# Patient Record
Sex: Male | Born: 2000 | Race: Black or African American | Hispanic: No | Marital: Single | Smoking: Current every day smoker
Health system: Southern US, Community
[De-identification: ages and names within clinical notes are randomized; demographics above are authoritative.]

## PROBLEM LIST (undated history)

## (undated) DIAGNOSIS — N200 Calculus of kidney: Secondary | ICD-10-CM

## (undated) DIAGNOSIS — W3400XA Accidental discharge from unspecified firearms or gun, initial encounter: Secondary | ICD-10-CM

## (undated) DIAGNOSIS — Z905 Acquired absence of kidney: Secondary | ICD-10-CM

## (undated) DIAGNOSIS — Z993 Dependence on wheelchair: Secondary | ICD-10-CM

## (undated) DIAGNOSIS — G8220 Paraplegia, unspecified: Secondary | ICD-10-CM

## (undated) DIAGNOSIS — Z933 Colostomy status: Secondary | ICD-10-CM

## (undated) DIAGNOSIS — N319 Neuromuscular dysfunction of bladder, unspecified: Secondary | ICD-10-CM

## (undated) DIAGNOSIS — Y249XXA Unspecified firearm discharge, undetermined intent, initial encounter: Secondary | ICD-10-CM

## (undated) DIAGNOSIS — M278 Other specified diseases of jaws: Secondary | ICD-10-CM

## (undated) DIAGNOSIS — N529 Male erectile dysfunction, unspecified: Secondary | ICD-10-CM

## (undated) DIAGNOSIS — I82409 Acute embolism and thrombosis of unspecified deep veins of unspecified lower extremity: Secondary | ICD-10-CM

## (undated) HISTORY — PX: COLOSTOMY: SHX63

---

## 2020-05-09 ENCOUNTER — Encounter: Payer: Self-pay | Admitting: Physical Medicine and Rehabilitation

## 2020-05-16 ENCOUNTER — Encounter: Payer: Self-pay | Admitting: Physical Medicine and Rehabilitation

## 2020-05-16 ENCOUNTER — Other Ambulatory Visit: Payer: Self-pay

## 2020-05-16 ENCOUNTER — Encounter
Payer: BC Managed Care – PPO | Attending: Physical Medicine and Rehabilitation | Admitting: Physical Medicine and Rehabilitation

## 2020-05-16 VITALS — BP 117/73 | HR 74 | Temp 97.8°F | Wt 121.5 lb

## 2020-05-16 DIAGNOSIS — N319 Neuromuscular dysfunction of bladder, unspecified: Secondary | ICD-10-CM | POA: Diagnosis present

## 2020-05-16 DIAGNOSIS — G894 Chronic pain syndrome: Secondary | ICD-10-CM | POA: Diagnosis present

## 2020-05-16 DIAGNOSIS — G8220 Paraplegia, unspecified: Secondary | ICD-10-CM | POA: Diagnosis not present

## 2020-05-16 DIAGNOSIS — R252 Cramp and spasm: Secondary | ICD-10-CM | POA: Insufficient documentation

## 2020-05-16 DIAGNOSIS — Z993 Dependence on wheelchair: Secondary | ICD-10-CM | POA: Insufficient documentation

## 2020-05-16 DIAGNOSIS — Z933 Colostomy status: Secondary | ICD-10-CM | POA: Insufficient documentation

## 2020-05-16 DIAGNOSIS — Z79891 Long term (current) use of opiate analgesic: Secondary | ICD-10-CM | POA: Diagnosis present

## 2020-05-16 DIAGNOSIS — Z5181 Encounter for therapeutic drug level monitoring: Secondary | ICD-10-CM | POA: Insufficient documentation

## 2020-05-16 MED ORDER — DULOXETINE HCL 60 MG PO CPEP: 60.0000 mg | ORAL_CAPSULE | Freq: Every day | ORAL | 5 refills | Status: DC

## 2020-05-16 NOTE — Addendum Note (Signed)
Addended by: Doreene Eland on: 05/16/2020 10:46 AM   Modules accepted: Orders
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
## 2020-05-16 NOTE — Patient Instructions (Signed)
1. Increase Duloxetine to 60 mg daily-  Can cause constipation- so might need to add an oral bowel medicine- Senokot? If needed  2. Takes Percocet when has exacerbation of back pain.  Spikes every other day. Out of pain meds.  Norco didn't help when has exacerbations.  3. Discussed reversal of colostomy vs not.  Discussed bowel program. Put in general surgery referral for reversal of colostomy if wanted.   Bowel program for SCI patients (BP)  1. Use Dulcolax suppositories 2. Start with doing bowel program within 1 hour after a meal- breakfast or dinner- most choose dinner 3. Take bowels meds -Po/oral at opposite time doing bowel program- if BP (bowel program) in evening, give PO meds in AM; and vice versa.  4. Start with digital stimulation- up to 2nd knuckle- stimulate rectum x 30 seconds 5. Place suppository- wait 10 minutes 6. Do Dig stim/Digital stimulation) every 10-15 minutes until empty 7. If gets severely constipated, >3 days, can use Magnesium Citrate and follow in 4 hours with suppository or any type of enema.  8. Takes 6 weeks or so to get bowel to have a habit.      3. Happy to write for Percocet once gets drug screen- and signs opiate contract. Call me to write prescription.   4.  F/U in 6 weeks.  Double appointment for SCI patients

## 2020-05-20 ENCOUNTER — Encounter: Payer: Self-pay | Admitting: Physical Therapy

## 2020-05-20 ENCOUNTER — Other Ambulatory Visit: Payer: Self-pay

## 2020-05-20 ENCOUNTER — Ambulatory Visit: Payer: BC Managed Care – PPO | Attending: Physical Medicine and Rehabilitation | Admitting: Physical Therapy

## 2020-05-20 DIAGNOSIS — R208 Other disturbances of skin sensation: Secondary | ICD-10-CM | POA: Insufficient documentation

## 2020-05-20 DIAGNOSIS — G8220 Paraplegia, unspecified: Secondary | ICD-10-CM | POA: Diagnosis present

## 2020-05-20 DIAGNOSIS — R2681 Unsteadiness on feet: Secondary | ICD-10-CM | POA: Diagnosis present

## 2020-05-20 DIAGNOSIS — G8221 Paraplegia, complete: Secondary | ICD-10-CM | POA: Insufficient documentation

## 2020-05-20 DIAGNOSIS — R29818 Other symptoms and signs involving the nervous system: Secondary | ICD-10-CM | POA: Diagnosis present

## 2020-05-21 ENCOUNTER — Telehealth: Payer: Self-pay | Admitting: Physical Therapy

## 2020-05-21 DIAGNOSIS — G8220 Paraplegia, unspecified: Secondary | ICD-10-CM

## 2020-05-21 LAB — DRUG TOX MONITOR 1 W/CONF, ORAL FLD
Amphetamines: NEGATIVE ng/mL (ref <10)
Barbiturates: NEGATIVE ng/mL (ref <10)
Benzodiazepines: NEGATIVE ng/mL (ref <0.50)
Buprenorphine: NEGATIVE ng/mL (ref <0.10)
Cocaine: NEGATIVE ng/mL (ref <5.0)
Cotinine: 227.8 ng/mL — ABNORMAL HIGH (ref <5.0)
Fentanyl: NEGATIVE ng/mL (ref <0.10)
Heroin Metabolite: NEGATIVE ng/mL (ref <1.0)
MARIJUANA: POSITIVE ng/mL — AB (ref <2.5)
MDMA: NEGATIVE ng/mL (ref <10)
Meprobamate: NEGATIVE ng/mL (ref <2.5)
Methadone: NEGATIVE ng/mL (ref <5.0)
Nicotine Metabolite: POSITIVE ng/mL — AB (ref <5.0)
Opiates: NEGATIVE ng/mL (ref <2.5)
Phencyclidine: NEGATIVE ng/mL (ref <10)
THC: 12.9 ng/mL — ABNORMAL HIGH (ref <2.5)
Tapentadol: NEGATIVE ng/mL (ref <5.0)
Tramadol: NEGATIVE ng/mL (ref <5.0)
Zolpidem: NEGATIVE ng/mL (ref <5.0)

## 2020-05-21 LAB — DRUG TOX ALC METAB W/CON, ORAL FLD: Alcohol Metabolite: NEGATIVE ng/mL (ref <25)
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
## 2020-05-21 NOTE — Telephone Encounter (Signed)
Hello Dr. Berline Chough, Meade Maw was evaluated by physical therapy yesterday to begin standing and gait training with HKAFO's.    His mother mentioned that he recently had a DVT in his LLE and that he had to stop PT in [place] because the edema in his LLE prevented his HKAFO's from fitting.  The edema has resolved but he is no longer taking blood thinner medication so I wanted to check with you first to see if we need to have him re-assessed for DVT before we begin standing and ambulating?    His mother also mentioned that the wound on his sacrum continues to re-open, especially when he transfers and sits on his shower seat at home for a prolonged period of time.  I feel like he may benefit from a padded tub bench - one for skin protection and two to improve safety with transfers in and out of the tub.  If you agree, could you write a DME order in Epic for a padded tub transfer bench?  Thank you for your assistance! Dierdre Highman, PT, DPT 05/21/20    9:45 PM

## 2020-05-23 ENCOUNTER — Other Ambulatory Visit: Payer: Self-pay

## 2020-05-23 ENCOUNTER — Ambulatory Visit: Payer: BC Managed Care – PPO | Admitting: Physical Therapy

## 2020-05-23 ENCOUNTER — Encounter: Payer: Self-pay | Admitting: Physical Therapy

## 2020-05-23 DIAGNOSIS — R2681 Unsteadiness on feet: Secondary | ICD-10-CM

## 2020-05-23 DIAGNOSIS — R208 Other disturbances of skin sensation: Secondary | ICD-10-CM

## 2020-05-23 DIAGNOSIS — G8221 Paraplegia, complete: Secondary | ICD-10-CM

## 2020-05-23 DIAGNOSIS — R29818 Other symptoms and signs involving the nervous system: Secondary | ICD-10-CM

## 2020-05-23 NOTE — Patient Instructions (Signed)
Access Code: [email or web address] URL: [email or web address] Date: 05/23/2020 Prepared by: Bufford Lope  Exercises Long Sitting Tricep Dip - 1 x daily - 7 x weekly - 2 sets - 10 reps Seated Shoulder Row with Anchored Resistance - 1 x daily - 7 x weekly - 2 sets - 12 reps Alternating Shoulder Press on Swiss Ball - 1 x daily - 7 x weekly - 2 sets - 10 reps Seated Shoulder External Rotation with Resistance - 1 x daily - 7 x weekly - 2 sets - 10 reps Seated Shoulder Internal Rotation with Anchored Resistance - 1 x daily - 7 x weekly - 2 sets - 10 reps Seated Single Arm Bicep Curls Supinated with Dumbbell - 1 x daily - 7 x weekly - 2 sets - 12 reps

## 2020-05-23 NOTE — Therapy (Signed)
[facility] [address] [place], [place], [address] Phone: [phone]   Fax:  [phone]  Physical Therapy Treatment  Patient Details  Name: Luke Velasquez MRN: [id number] Date of Birth: 04-04-2001 Referring Provider (PT): Curlene Dolphin, MD (Physician in [place]; care has been transferred to Genice Rouge, MD   Encounter Date: 05/23/2020   PT End of Session - 05/23/20 1259    Visit Number 2    Number of Visits 21    Date for PT Re-Evaluation 07/30/20    Authorization Type BCBS    Authorization Time Period VL: 60 PT/OT/SLP combined    Authorization - Visit Number 2    Authorization - Number of Visits 60    PT Start Time 1017    PT Stop Time 1103    PT Time Calculation (min) 46 min    Activity Tolerance Patient tolerated treatment well    Behavior During Therapy [facility] for tasks assessed/performed           History reviewed. No pertinent past medical history.  History reviewed. No pertinent surgical history.  There were no vitals filed for this visit.   Subjective Assessment - 05/23/20 1030    Subjective No new issues to report.  Discussed equipment and DVT with pt and mother.    Patient is accompained by: Family member    Pertinent History paraplegia, colostomy, spasticity, neurogenic bowel and bladder, chronic pain, pressure ulcers    Patient Stated Goals To be able to walk around the house with HKAFO's.  Pt owns a standard walker.    Currently in Pain? No/denies                             [facility] Adult PT Treatment/Exercise - 05/23/20 1254      Therapeutic Activites    Therapeutic Activities Other Therapeutic Activities    Other Therapeutic Activities Provided pt and mother with contact information for prosthetics and orthotics companies to contact about working with patient's current HKAFO's.  Also provided pt and mother with DME order for padded tub transfer bench, picture of Drive  transfer bench and provided mother with names of medical supply stores in the area.  Mother asking about recommendations for pt to gain weight - discussed importance of eating protein and healthy fats, especially when pt has a wound that is trying to heal but did not make any specific recommendations for diet.  Will discuss referral to nutritionist with physiatrist.  While performing exercises on the mat, therapist provided pt with reminders to perform pressure relief with UE push ups or supported forward leaning.  Discussed DVT history; mother reports pt developed DVT in June and was started on blood thinner in June; ultrasound of LLE was performed in August and it was recommended that pt have follow up scan.  Pt moved to [place] with only a sample pack of blood thinner and no instructions about refill vs. stopping.  PT to continue to discuss recommendations with physiatrist.  Will continue to hold on standing.      Exercises   Exercises Other Exercises    Other Exercises  Reviewed the following exercises below for UE strengthening to prepare for standing and walking with HKAFOs.  Some exercises pt has already been performing with heavier weights supported in wheelchair.  Discussed modifying current exercises and performing in w/c without back support and using lower weight in order to focus on core strengthening and  stability.  Pt return demonstrated exercises with either 10lb hand weight or blue theraband.  Pt performed in long and short sitting on mat and supervision.           Access Code: [email or web address] URL: [email or web address] Date: 05/23/2020 Prepared by: Bufford Lope  Exercises Long Sitting Tricep Dip - 1 x daily - 7 x weekly - 2 sets - 10 reps Seated Shoulder Row with Anchored Resistance - 1 x daily - 7 x weekly - 2 sets - 12 reps Alternating Shoulder Press on Swiss Ball - 1 x daily - 7 x weekly - 2 sets - 10 reps Seated Shoulder External Rotation with Resistance - 1 x daily - 7 x  weekly - 2 sets - 10 reps Seated Shoulder Internal Rotation with Anchored Resistance - 1 x daily - 7 x weekly - 2 sets - 10 reps Seated Single Arm Bicep Curls Supinated with Dumbbell - 1 x daily - 7 x weekly - 2 sets - 12 reps     PT Education - 05/23/20 1259    Education Details see TA; initiated HEP    Person(s) Educated Patient;Parent(s)    Methods Explanation;Demonstration;Handout    Comprehension Verbalized understanding;Returned demonstration            PT Short Term Goals - 05/21/20 2121      PT SHORT TERM GOAL #1   Title Pt will demonstrate independence with initial HEP    Time 5    Period Weeks    Status New    Target Date 06/25/20      PT SHORT TERM GOAL #2   Title Pt will demonstrate ability to transfer sit > stand from mat or wheelchair with HKAFO's with min A    Time 5    Period Weeks    Status New    Target Date 06/25/20      PT SHORT TERM GOAL #3   Title Pt will ambulate down and back in parallel bars x 3 trips with min A    Time 5    Period Weeks    Status New    Target Date 06/25/20      PT SHORT TERM GOAL #4   Title Pt will be able to sit without back support for >10 minutes while performing dynamic activities with bilat UE with close supervision    Time 5    Period Weeks    Status New    Target Date 06/25/20             PT Long Term Goals - 05/21/20 2126      PT LONG TERM GOAL #1   Title Pt will demonstrate independence with final HEP    Time 10    Period Weeks    Status New    Target Date 07/30/20      PT LONG TERM GOAL #2   Title Pt will participate in w/c and seating evaluation for more appropriate manual wheelchair    Time 10    Period Weeks    Status New    Target Date 07/30/20      PT LONG TERM GOAL #3   Title Pt will perform sit > stand with HKAFO's and LRAD with supervision    Time 10    Period Weeks    Status New    Target Date 07/30/20      PT LONG TERM GOAL #4   Title Pt will ambulate x 115' with HKAFO's and LRAD  over  level, indoor surfaces and min A    Time 10    Period Weeks    Status New    Target Date 07/30/20                 Plan - 05/23/20 1301    Clinical Impression Statement Treatment session focused on providing pt and mother with equipment order and resources for equipment in the community.  Continued to discuss history and course of treatment for DVT; will discuss again with physiatrist and provide her with new information.  Initiated UE strengthening, core and trunk control training in unsupported sitting with hand weights and theraband.  Added exercises to address all muscle groups around the shoulder, core and back.  Will continue to address and progress towards LTG.    Personal Factors and Comorbidities Comorbidity 3+;Past/Current Experience;Transportation    Comorbidities paraplegia, colostomy, spasticity, neurogenic bowel and bladder, chronic pain, pressure ulcers    Examination-Activity Limitations Locomotion Level;Stand    Examination-Participation Restrictions Community Activity;Driving    Stability/Clinical Decision Making Evolving/Moderate complexity    Rehab Potential Good    PT Frequency 2x / week    PT Duration Other (comment)   10 weeks   PT Treatment/Interventions ADLs/Self Care Home Management;DME Instruction;Gait training;Functional mobility training;Therapeutic activities;Therapeutic exercise;Balance training;Neuromuscular re-education;Patient/family education;Orthotic Fit/Training;Wheelchair mobility training    PT Next Visit Plan Discussing DVT with Dr. Berline Chough.  How is HEP going?  Continue to work on higher level core strengthening, sitting balance, UE extension strengthening to prepare for standing/using HKAFOs.    Consulted and Agree with Plan of Care Patient;Family member/caregiver    Family Member Consulted Mother - Delaney Meigs           Patient will benefit from skilled therapeutic intervention in order to improve the following deficits and impairments:   Abnormal gait, Decreased balance, Decreased strength, Difficulty walking, Impaired sensation, Impaired tone  Visit Diagnosis: Paraplegia, complete (HCC)  Other disturbances of skin sensation  Other symptoms and signs involving the nervous system  Unsteadiness on feet     Problem List Patient Active Problem List   Diagnosis Date Noted  . 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 Paraplegia (HCC) 05/16/2020  . Spasticity 05/16/2020  . Neurogenic bladder 05/16/2020  . Wheelchair dependence 05/16/2020    Dierdre Highman, PT, DPT 05/23/20    1:08 PM    [facility] [address] [place], [place], [address] Phone: [phone]   Fax:  [phone]  Name: Luke Velasquez MRN: [id number] Date of Birth: Aug 29, 2000

## 2020-05-28 ENCOUNTER — Ambulatory Visit: Payer: BC Managed Care – PPO

## 2020-05-28 ENCOUNTER — Other Ambulatory Visit: Payer: Self-pay

## 2020-05-28 DIAGNOSIS — R2681 Unsteadiness on feet: Secondary | ICD-10-CM

## 2020-05-28 DIAGNOSIS — R29818 Other symptoms and signs involving the nervous system: Secondary | ICD-10-CM

## 2020-05-28 DIAGNOSIS — G8221 Paraplegia, complete: Secondary | ICD-10-CM | POA: Diagnosis not present

## 2020-05-28 DIAGNOSIS — G8220 Paraplegia, unspecified: Secondary | ICD-10-CM

## 2020-05-28 NOTE — Therapy (Signed)
[facility] [address] [place], [place], [address] Phone: [phone]   Fax:  [phone]  Physical Therapy Treatment  Patient Details  Name: Luke Velasquez MRN: [id number] Date of Birth: 09/17/2000 Referring Provider (PT): Curlene Dolphin, MD (Physician in [place]; care has been transferred to Genice Rouge, MD   Encounter Date: 05/28/2020   PT End of Session - 05/28/20 1358    Visit Number 3    Number of Visits 21    Date for PT Re-Evaluation 07/30/20    Authorization Type BCBS    Authorization Time Period VL: 60 PT/OT/SLP combined    Authorization - Visit Number 2    Authorization - Number of Visits 60    PT Start Time 0850    PT Stop Time 0935    PT Time Calculation (min) 45 min    Activity Tolerance Patient tolerated treatment well    Behavior During Therapy [facility] for tasks assessed/performed           History reviewed. No pertinent past medical history.  History reviewed. No pertinent surgical history.  There were no vitals filed for this visit.   Subjective Assessment - 05/28/20 1327    Subjective No new issues to report.  Pt reports HEP is going well. Mom to go pick up padded tub bench today. Mother and pt report that they have no followed up about DVT, no more issues.    Patient is accompained by: Family member    Pertinent History paraplegia, colostomy, spasticity, neurogenic bowel and bladder, chronic pain, pressure ulcers    Patient Stated Goals To be able to walk around the house with HKAFO's.  Pt owns a standard walker.    Currently in Pain? No/denies                             [facility] Adult PT Treatment/Exercise - 05/28/20 1329      Neuro Re-ed    Neuro Re-ed Details  Pt was instructed in tossing/catching 2.2 lb ball in long-sitting x25 reps with intermittent UE support on mat to catch himself <3 times. Pt then instructed in lateral reaching in multidirectional planes for  cones x32 reps. PT then progressed to reaching across body to promote more trunk rotation x25 reps. Pt with more difficulty reaching across body with intermittent use of opposite UE. Pt then instructed in multidirectional punching at targets including reaching across body while in long-sitting. Pt able to complete 25 reps in multiple planes with supervision and no LOB. Pt then instructed in multidirectional perturbations in long-sitting with <3 LOB requiring UE support on mat to catch himself.       Exercises   Exercises Other Exercises;Knee/Hip    Other Exercises  Pt instructed in tricep dips in long-sitting on mat pushing up with BUE on kaye bench x10 reps with PT holding table for stability. Pt able to lift bottom off table but not able to push up into full elbow extension. Pt then instructed in pec flys with 6lb dumbbells bil. Pt completed x6 reps with difficulty controlling movement and maintaining uprught posture. Pt then instructed in [facility] press with 4lb weights x12 reps with good postural control.  Pt also instructed in bil ER with green Tb against PT resistance x10 reps bil.      Knee/Hip Exercises: Stretches   Hip Flexor Stretch 2 reps;Both    Hip Flexor Stretch Limitations PT manually stretched bil hip  flexors in sidelying with 30 s hold x 2 reps.                    PT Short Term Goals - 05/21/20 2121      PT SHORT TERM GOAL #1   Title Pt will demonstrate independence with initial HEP    Time 5    Period Weeks    Status New    Target Date 06/25/20      PT SHORT TERM GOAL #2   Title Pt will demonstrate ability to transfer sit > stand from mat or wheelchair with HKAFO's with min A    Time 5    Period Weeks    Status New    Target Date 06/25/20      PT SHORT TERM GOAL #3   Title Pt will ambulate down and back in parallel bars x 3 trips with min A    Time 5    Period Weeks    Status New    Target Date 06/25/20      PT SHORT TERM GOAL #4   Title Pt will be able to  sit without back support for >10 minutes while performing dynamic activities with bilat UE with close supervision    Time 5    Period Weeks    Status New    Target Date 06/25/20             PT Long Term Goals - 05/21/20 2126      PT LONG TERM GOAL #1   Title Pt will demonstrate independence with final HEP    Time 10    Period Weeks    Status New    Target Date 07/30/20      PT LONG TERM GOAL #2   Title Pt will participate in w/c and seating evaluation for more appropriate manual wheelchair    Time 10    Period Weeks    Status New    Target Date 07/30/20      PT LONG TERM GOAL #3   Title Pt will perform sit > stand with HKAFO's and LRAD with supervision    Time 10    Period Weeks    Status New    Target Date 07/30/20      PT LONG TERM GOAL #4   Title Pt will ambulate x 115' with HKAFO's and LRAD over level, indoor surfaces and min A    Time 10    Period Weeks    Status New    Target Date 07/30/20                 Plan - 05/28/20 1358    Clinical Impression Statement Treatment session focused UE strengthening, sitting balance, balance reactions, core strengthening and multidirectional reaching. Pt with impressive UE strength and good postural control in long-sitting without UE support. PT to continue to work on strengthening in UE, chest, back, and core musculature available and continue to progress towards LTGs.    Personal Factors and Comorbidities Comorbidity 3+;Past/Current Experience;Transportation    Comorbidities paraplegia, colostomy, spasticity, neurogenic bowel and bladder, chronic pain, pressure ulcers    Examination-Activity Limitations Locomotion Level;Stand    Examination-Participation Restrictions Community Activity;Driving    Stability/Clinical Decision Making Evolving/Moderate complexity    Rehab Potential Good    PT Frequency 2x / week    PT Duration Other (comment)   10 weeks   PT Treatment/Interventions ADLs/Self Care Home Management;DME  Instruction;Gait training;Functional mobility training;Therapeutic activities;Therapeutic exercise;Balance  training;Neuromuscular re-education;Patient/family education;Orthotic Fit/Training;Wheelchair mobility training    PT Next Visit Plan Progress HEP as necessary.  Continue to work on higher level core strengthening, sitting balance, UE extension strengthening to prepare for standing/using HKAFOs.    Consulted and Agree with Plan of Care Patient;Family member/caregiver    Family Member Consulted Mother - Delaney Meigs           Patient will benefit from skilled therapeutic intervention in order to improve the following deficits and impairments:  Abnormal gait, Decreased balance, Decreased strength, Difficulty walking, Impaired sensation, Impaired tone  Visit Diagnosis: No diagnosis found.     Problem List Patient Active Problem List   Diagnosis Date Noted  . 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 Paraplegia (HCC) 05/16/2020  . Spasticity 05/16/2020  . Neurogenic bladder 05/16/2020  . Wheelchair dependence 05/16/2020    Isabella Bowens, SPT 05/28/2020, 2:03 PM  [facility] [address] [place], [place], [address] Phone: [phone]   Fax:  [phone]  Name: Luke Velasquez MRN: [id number] Date of Birth: 09-25-2000

## 2020-05-28 NOTE — Therapy (Signed)
[facility] [address] [place], [place], [address] Phone: [phone]   Fax:  [phone]  Physical Therapy Treatment  Patient Details  Name: Luke Velasquez MRN: [id number] Date of Birth: 05-26-2001 Referring Provider (PT): Curlene Dolphin, MD (Physician in [place]; care has been transferred to Genice Rouge, MD   Encounter Date: 05/28/2020   PT End of Session - 05/28/20 1358    Visit Number 3    Number of Visits 21    Date for PT Re-Evaluation 07/30/20    Authorization Type BCBS    Authorization Time Period VL: 60 PT/OT/SLP combined    Authorization - Visit Number 2    Authorization - Number of Visits 60    PT Start Time 0850    PT Stop Time 0935    PT Time Calculation (min) 45 min    Activity Tolerance Patient tolerated treatment well    Behavior During Therapy [facility] for tasks assessed/performed           History reviewed. No pertinent past medical history.  History reviewed. No pertinent surgical history.  There were no vitals filed for this visit.   Subjective Assessment - 05/28/20 1327    Subjective No new issues to report.  Pt reports HEP is going well. Mom to go pick up padded tub bench today. Mother and pt report that they have no followed up about DVT, no more issues.    Patient is accompained by: Family member    Pertinent History paraplegia, colostomy, spasticity, neurogenic bowel and bladder, chronic pain, pressure ulcers    Patient Stated Goals To be able to walk around the house with HKAFO's.  Pt owns a standard walker.    Currently in Pain? No/denies                             [facility] Adult PT Treatment/Exercise - 05/28/20 1329      Neuro Re-ed    Neuro Re-ed Details  Pt was instructed in tossing/catching 2.2 lb ball in long-sitting x25 reps with intermittent UE support on mat to catch himself <3 times. Pt then instructed in lateral reaching in multidirectional planes for  cones x32 reps. PT then progressed to reaching across body to promote more trunk rotation x25 reps. Pt with more difficulty reaching across body with intermittent use of opposite UE. Pt then instructed in multidirectional punching at targets including reaching across body while in long-sitting. Pt able to complete 25 reps in multiple planes with supervision and no LOB. Pt then instructed in multidirectional perturbations in long-sitting with <3 LOB requiring UE support on mat to catch himself.       Exercises   Exercises Other Exercises;Knee/Hip    Other Exercises  Pt instructed in tricep dips in long-sitting on mat pushing up with BUE on kaye bench x10 reps with PT holding table for stability. Pt able to lift bottom off table but not able to push up into full elbow extension. Pt then instructed in pec flys with 6lb dumbbells bil. Pt completed x6 reps with difficulty controlling movement and maintaining uprught posture. Pt then instructed in [facility] press with 4lb weights x12 reps with good postural control.  Pt also instructed in bil ER with green Tb against PT resistance x10 reps bil.      Knee/Hip Exercises: Stretches   Hip Flexor Stretch 2 reps;Both    Hip Flexor Stretch Limitations PT manually stretched bil hip  flexors in sidelying with 30 s hold x 2 reps.    Other Knee/Hip Stretches Attempted prone on outstretched  arms (cobra stretch) to stretch hip flexors but pt denied any sensation of stretch and unable to keep pelvis down.                     PT Short Term Goals - 05/21/20 2121      PT SHORT TERM GOAL #1   Title Pt will demonstrate independence with initial HEP    Time 5    Period Weeks    Status New    Target Date 06/25/20      PT SHORT TERM GOAL #2   Title Pt will demonstrate ability to transfer sit > stand from mat or wheelchair with HKAFO's with min A    Time 5    Period Weeks    Status New    Target Date 06/25/20      PT SHORT TERM GOAL #3   Title Pt will ambulate  down and back in parallel bars x 3 trips with min A    Time 5    Period Weeks    Status New    Target Date 06/25/20      PT SHORT TERM GOAL #4   Title Pt will be able to sit without back support for >10 minutes while performing dynamic activities with bilat UE with close supervision    Time 5    Period Weeks    Status New    Target Date 06/25/20             PT Long Term Goals - 05/21/20 2126      PT LONG TERM GOAL #1   Title Pt will demonstrate independence with final HEP    Time 10    Period Weeks    Status New    Target Date 07/30/20      PT LONG TERM GOAL #2   Title Pt will participate in w/c and seating evaluation for more appropriate manual wheelchair    Time 10    Period Weeks    Status New    Target Date 07/30/20      PT LONG TERM GOAL #3   Title Pt will perform sit > stand with HKAFO's and LRAD with supervision    Time 10    Period Weeks    Status New    Target Date 07/30/20      PT LONG TERM GOAL #4   Title Pt will ambulate x 115' with HKAFO's and LRAD over level, indoor surfaces and min A    Time 10    Period Weeks    Status New    Target Date 07/30/20                 Plan - 05/28/20 1358    Clinical Impression Statement Treatment session focused UE strengthening, sitting balance, balance reactions, core strengthening and multidirectional reaching. Pt with impressive UE strength and good postural control in long-sitting without UE support. PT to continue to work on strengthening in UE, chest, back, and core musculature available and continue to progress towards LTGs.    Personal Factors and Comorbidities Comorbidity 3+;Past/Current Experience;Transportation    Comorbidities paraplegia, colostomy, spasticity, neurogenic bowel and bladder, chronic pain, pressure ulcers    Examination-Activity Limitations Locomotion Level;Stand    Examination-Participation Restrictions Community Activity;Driving    Stability/Clinical Decision Making  Evolving/Moderate complexity    Rehab Potential Good  PT Frequency 2x / week    PT Duration Other (comment)   10 weeks   PT Treatment/Interventions ADLs/Self Care Home Management;DME Instruction;Gait training;Functional mobility training;Therapeutic activities;Therapeutic exercise;Balance training;Neuromuscular re-education;Patient/family education;Orthotic Fit/Training;Wheelchair mobility training    PT Next Visit Plan Progress HEP as necessary.  Continue to work on higher level core strengthening, sitting balance, UE extension strengthening to prepare for standing/using HKAFOs.    Consulted and Agree with Plan of Care Patient;Family member/caregiver    Family Member Consulted Mother - Delaney Meigs           Patient will benefit from skilled therapeutic intervention in order to improve the following deficits and impairments:  Abnormal gait, Decreased balance, Decreased strength, Difficulty walking, Impaired sensation, Impaired tone  Visit Diagnosis: Paraplegia (HCC)  Unsteadiness on feet  Other symptoms and signs involving the nervous system     Problem List Patient Active Problem List   Diagnosis Date Noted  . 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 Paraplegia (HCC) 05/16/2020  . Spasticity 05/16/2020  . Neurogenic bladder 05/16/2020  . Wheelchair dependence 05/16/2020    Isabella Bowens, SPT 05/28/2020, 2:36 PM  [place]  [facility] [address] [place], [place], [address] Phone: [phone]   Fax:  [phone]  Name: Luke Velasquez MRN: [id number] Date of Birth: [birth year]

## 2020-05-29 ENCOUNTER — Ambulatory Visit: Payer: BC Managed Care – PPO

## 2020-05-29 ENCOUNTER — Telehealth: Payer: Self-pay | Admitting: *Deleted

## 2020-05-29 DIAGNOSIS — G8221 Paraplegia, complete: Secondary | ICD-10-CM | POA: Diagnosis not present

## 2020-05-29 NOTE — Telephone Encounter (Signed)
Oral swab is positive for marijuana.

## 2020-05-29 NOTE — Therapy (Signed)
[facility] [address] [place], [place], [address] Phone: [phone]   Fax:  [phone]  Physical Therapy Treatment  Patient Details  Name: Luke Velasquez MRN: [id number] Date of Birth: 2000-10-19 Referring Provider (PT): Curlene Dolphin, MD (Physician in [place]; care has been transferred to Genice Rouge, MD   Encounter Date: 05/29/2020   PT End of Session - 05/29/20 1843    Visit Number 4    Number of Visits 21    Date for PT Re-Evaluation 07/30/20    Authorization Type BCBS    Authorization Time Period VL: 60 PT/OT/SLP combined    Authorization - Visit Number 4    Authorization - Number of Visits 60    PT Start Time 0416    PT Stop Time 0501    PT Time Calculation (min) 45 min    Activity Tolerance Patient tolerated treatment well    Behavior During Therapy [facility] for tasks assessed/performed           History reviewed. No pertinent past medical history.  History reviewed. No pertinent surgical history.  There were no vitals filed for this visit.   Subjective Assessment - 05/29/20 1712    Subjective No new issues to report. Pt reports HEP is going well.    Patient is accompained by: Family member    Pertinent History paraplegia, colostomy, spasticity, neurogenic bowel and bladder, chronic pain, pressure ulcers    Patient Stated Goals To be able to walk around the house with HKAFO's.  Pt owns a standard walker.    Currently in Pain? No/denies                             [facility] Adult PT Treatment/Exercise - 05/29/20 1713      Neuro Re-ed    Neuro Re-ed Details  Pt was instructed in tossing/catching football in long-sitting x25 reps with intermittent UE support on mat to catch himself <3 times and PT guarding for trunk support. Pt then instructed in reaching in multidirectional planes for cones reaching across body to promote more trunk rotation x40 reps with intermittent use of  opposite UE. Pt cued to not use contralateral UE for additional 16 reps      Exercises   Exercises Other Exercises    Other Exercises  In prone: Pt instructed in push-ups x10 reps on mat. Pt then instructed in Y-raises for Lower trap x10 reps holding dowel rod with tactile cue for scapular squeeze. With peanut (blue) underneath torso in prone position: Pt then instructed in rows with bil 6 lb weights x10 reps with PT giving tactile cues for scapular squeezes. Pt then instructed in shoulder extension and ER exercises with elbows extended going up behind head and back down to drop ball into basket with 4 balls of increasing weight x7 reps bil x 2 sets. Pt was max assist to get in position over ball and position legs to get some weight bearing through them and PT provided min/mod assist to stabilize at times with exercises. In long sitting: Pt then instructed in Lat pull downs with black theraband x 10 reps with PT guarding for trunk support. Pt then instructed in tricep curls x10 reps bil with 4 lb weights and then additional x10 reps with both hands on 6 lb weight. In supine: DB pullovers with 8 lb weight x 10 reps, chest press with bil 6 lb DBs. Pt completed all exercises with  close supervision-CGA . PT attempted tricep dips on lower mat with red mat on floor with 6" step and 2 foam airex pads stacked with pt siting on step and pushing up off mat table. Pt completed x8 with difficulty coming to full ext d/t trunk instability.                    PT Short Term Goals - 05/21/20 2121      PT SHORT TERM GOAL #1   Title Pt will demonstrate independence with initial HEP    Time 5    Period Weeks    Status New    Target Date 06/25/20      PT SHORT TERM GOAL #2   Title Pt will demonstrate ability to transfer sit > stand from mat or wheelchair with HKAFO's with min A    Time 5    Period Weeks    Status New    Target Date 06/25/20      PT SHORT TERM GOAL #3   Title Pt will ambulate down and  back in parallel bars x 3 trips with min A    Time 5    Period Weeks    Status New    Target Date 06/25/20      PT SHORT TERM GOAL #4   Title Pt will be able to sit without back support for >10 minutes while performing dynamic activities with bilat UE with close supervision    Time 5    Period Weeks    Status New    Target Date 06/25/20             PT Long Term Goals - 05/21/20 2126      PT LONG TERM GOAL #1   Title Pt will demonstrate independence with final HEP    Time 10    Period Weeks    Status New    Target Date 07/30/20      PT LONG TERM GOAL #2   Title Pt will participate in w/c and seating evaluation for more appropriate manual wheelchair    Time 10    Period Weeks    Status New    Target Date 07/30/20      PT LONG TERM GOAL #3   Title Pt will perform sit > stand with HKAFO's and LRAD with supervision    Time 10    Period Weeks    Status New    Target Date 07/30/20      PT LONG TERM GOAL #4   Title Pt will ambulate x 115' with HKAFO's and LRAD over level, indoor surfaces and min A    Time 10    Period Weeks    Status New    Target Date 07/30/20                 Plan - 05/29/20 1846    Clinical Impression Statement Treatment session focused on back, chest, and UE strengthening, sitting balance, balance reactions, core strengthening and multidirectional reaching. Pt focused on target lats, pectoralis major, lower trap, and triceps as well as shoudler girdle during today's session. Pt is continuing ot progress sitting balance in long-sitting without UE support and with dynamic UE movement. PT to continue to work on strengthening in UE, chest, back, and core musculature available and continue to progress towards LTGs.    Personal Factors and Comorbidities Comorbidity 3+;Past/Current Experience;Transportation    Comorbidities paraplegia, colostomy, spasticity, neurogenic bowel and bladder, chronic pain, pressure  ulcers    Examination-Activity  Limitations Locomotion Level;Stand    Examination-Participation Restrictions Community Activity;Driving    Stability/Clinical Decision Making Evolving/Moderate complexity    Rehab Potential Good    PT Frequency 2x / week    PT Duration Other (comment)   10 weeks   PT Treatment/Interventions ADLs/Self Care Home Management;DME Instruction;Gait training;Functional mobility training;Therapeutic activities;Therapeutic exercise;Balance training;Neuromuscular re-education;Patient/family education;Orthotic Fit/Training;Wheelchair mobility training    PT Next Visit Plan Progress HEP as necessary.  Continue to work on higher level core strengthening, sitting balance, UE extension strengthening to prepare for standing/using HKAFOs.    Consulted and Agree with Plan of Care Patient;Family member/caregiver    Family Member Consulted Mother - Delaney Meigs           Patient will benefit from skilled therapeutic intervention in order to improve the following deficits and impairments:  Abnormal gait, Decreased balance, Decreased strength, Difficulty walking, Impaired sensation, Impaired tone  Visit Diagnosis: Paraplegia, complete [facility])     Problem List Patient Active Problem List   Diagnosis Date Noted  . 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 Paraplegia (HCC) 05/16/2020  . Spasticity 05/16/2020  . Neurogenic bladder 05/16/2020  . Wheelchair dependence 05/16/2020    Isabella Bowens, SPT 05/30/2020, 7:50 AM  [facility] [address] [place], [place], [address] Phone: [phone]   Fax:  [phone]  Name: Luke Velasquez MRN: [id number] Date of Birth: August 21, 2000

## 2020-05-30 ENCOUNTER — Ambulatory Visit: Payer: BC Managed Care – PPO

## 2020-06-06 ENCOUNTER — Other Ambulatory Visit: Payer: Self-pay

## 2020-06-06 ENCOUNTER — Other Ambulatory Visit: Payer: Self-pay | Admitting: Physical Medicine and Rehabilitation

## 2020-06-06 ENCOUNTER — Ambulatory Visit: Payer: BC Managed Care – PPO | Admitting: Physical Therapy

## 2020-06-06 DIAGNOSIS — G8221 Paraplegia, complete: Secondary | ICD-10-CM | POA: Diagnosis not present

## 2020-06-06 DIAGNOSIS — R208 Other disturbances of skin sensation: Secondary | ICD-10-CM

## 2020-06-06 DIAGNOSIS — R29818 Other symptoms and signs involving the nervous system: Secondary | ICD-10-CM

## 2020-06-06 DIAGNOSIS — R2681 Unsteadiness on feet: Secondary | ICD-10-CM

## 2020-06-06 MED ORDER — BACLOFEN 10 MG PO TABS
10.0000 mg | ORAL_TABLET | Freq: Three times a day (TID) | ORAL | 11 refills | Status: DC
Start: 2020-06-06 — End: 2020-06-27

## 2020-06-08 ENCOUNTER — Encounter: Payer: Self-pay | Admitting: Physical Therapy

## 2020-06-08 NOTE — Therapy (Signed)
[facility] [address] [place], [place], [address] Phone: [phone]   Fax:  [phone]  Physical Therapy Treatment  Patient Details  Name: Luke Velasquez MRN: [id number] Date of Birth: [birth year] Referring Provider (PT): Curlene Dolphin, MD (Physician in [place]; care has been transferred to Genice Rouge, MD   Encounter Date: 06/06/2020   PT End of Session - 06/08/20 1748    Visit Number 5    Number of Visits 21    Date for PT Re-Evaluation 07/30/20    Authorization Type BCBS    Authorization Time Period VL: 60 PT/OT/SLP combined    Authorization - Visit Number 5    Authorization - Number of Visits 60    PT Start Time 1322    PT Stop Time 1400    PT Time Calculation (min) 38 min    Equipment Utilized During Treatment Other (comment)   KAFO   Activity Tolerance Patient tolerated treatment well    Behavior During Therapy [facility] for tasks assessed/performed           History reviewed. No pertinent past medical history.  History reviewed. No pertinent surgical history.  There were no vitals filed for this visit.   Subjective Assessment - 06/08/20 1735    Subjective Mom has ordered tub bench.  Brought in KAFO's to try today.    Patient is accompained by: Family member    Pertinent History paraplegia, colostomy, spasticity, neurogenic bowel and bladder, chronic pain, pressure ulcers    Patient Stated Goals To be able to walk around the house with HKAFO's.  Pt owns a standard walker.    Currently in Pain? No/denies                             [facility] Adult PT Treatment/Exercise - 06/08/20 1736      Transfers   Transfers Sit to Stand;Stand to Sit    Sit to Stand 3: Mod assist    Sit to Stand Details (indicate cue type and reason) performed sit > stand multiple times in // bars from w/c with KAFO donned with pt locking knees out in extension and then leaning over extended LE to stand with  min-mod A.  Performed one trial of setting knee locks and then standing from w/c with knees transitioning from flexion > extension as pt stood.      Stand to Sit 3: Mod assist    Stand to Sit Details stand >sit to w/c with bilat knees still locked in extension.  Therapist assisted with lifting LE as pt lowered self to wheelchair       Ambulation/Gait   Ambulation/Gait Yes    Ambulation/Gait Assistance 1: +2 Total assist    Ambulation/Gait Assistance Details With KAFO's donned performed multiple reps gait forwards in // bars with one therapist assisting at trunk and pelvis for postural control and balance and providing visual and tactile cues for diagonal weight shift to stance LE and use of trunk extension and pushing pelvis anteriorly to advance each LE.  Pt tends to lean trunk forwards and then pick self up with UE.  Pt required intermittent assistance to advance each LE from second therapist but with repetition pt required decreased assistance    Ambulation Distance (Feet) 10 Feet    Assistive device Parallel bars    Gait Pattern Step-through pattern;Decreased step length - right;Decreased step length - left;Decreased stride length    Ambulation Surface  Level;Indoor    Gait Comments Discussed use of body weight support over ground next session to ambulate further distances.  PT to contact [employer] to see if they would be able to modify his current KAFO's to add reciprocating gait mechanism.        Balance   Balance Assessed Yes      Static Standing Balance   Static Standing - Balance Support Right upper extremity supported;Left upper extremity supported    Static Standing - Level of Assistance 4: Min assist    Static Standing - Comment/# of Minutes Standing in // bars with bilat KAFO; focused on leaning posteriorly and supporting self on Y-ligaments and then releasing // bar with one UE; performed alternating UE raises                  PT Education - 06/08/20 1748    Education Details  sit >stand and gait with KAFO    Person(s) Educated Patient;Parent(s)    Methods Explanation    Comprehension Need further instruction            PT Short Term Goals - 05/21/20 2121      PT SHORT TERM GOAL #1   Title Pt will demonstrate independence with initial HEP    Time 5    Period Weeks    Status New    Target Date 06/25/20      PT SHORT TERM GOAL #2   Title Pt will demonstrate ability to transfer sit > stand from mat or wheelchair with HKAFO's with min A    Time 5    Period Weeks    Status New    Target Date 06/25/20      PT SHORT TERM GOAL #3   Title Pt will ambulate down and back in parallel bars x 3 trips with min A    Time 5    Period Weeks    Status New    Target Date 06/25/20      PT SHORT TERM GOAL #4   Title Pt will be able to sit without back support for >10 minutes while performing dynamic activities with bilat UE with close supervision    Time 5    Period Weeks    Status New    Target Date 06/25/20             PT Long Term Goals - 05/21/20 2126      PT LONG TERM GOAL #1   Title Pt will demonstrate independence with final HEP    Time 10    Period Weeks    Status New    Target Date 07/30/20      PT LONG TERM GOAL #2   Title Pt will participate in w/c and seating evaluation for more appropriate manual wheelchair    Time 10    Period Weeks    Status New    Target Date 07/30/20      PT LONG TERM GOAL #3   Title Pt will perform sit > stand with HKAFO's and LRAD with supervision    Time 10    Period Weeks    Status New    Target Date 07/30/20      PT LONG TERM GOAL #4   Title Pt will ambulate x 115' with HKAFO's and LRAD over level, indoor surfaces and min A    Time 10    Period Weeks    Status New    Target Date 07/30/20  Plan - 06/08/20 1749    Clinical Impression Statement Initiated sit > stand and gait training with bilat KAFO in // bars.  Focused on teaching patient how to utilize trunk extension with  weight shift to initiate hip flexion and LE advancement.  Pt able to progress to performing with min-mod A of one person.  Pt may benefit from reciprocal gait orthosis.  Will continue to train and will continue to address in order to progress towards LTG.    Personal Factors and Comorbidities Comorbidity 3+;Past/Current Experience;Transportation    Comorbidities paraplegia, colostomy, spasticity, neurogenic bowel and bladder, chronic pain, pressure ulcers    Examination-Activity Limitations Locomotion Level;Stand    Examination-Participation Restrictions Community Activity;Driving    Stability/Clinical Decision Making Evolving/Moderate complexity    Rehab Potential Good    PT Frequency 2x / week    PT Duration Other (comment)   10 weeks   PT Treatment/Interventions ADLs/Self Care Home Management;DME Instruction;Gait training;Functional mobility training;Therapeutic activities;Therapeutic exercise;Balance training;Neuromuscular re-education;Patient/family education;Orthotic Fit/Training;Wheelchair mobility training    PT Next Visit Plan Gait in BWS with KAFO's. Continue to work on higher level core strengthening, sitting balance, UE extension strengthening.  Progress HEP when indicated.    Recommended Other Services Will contact [employer] about adding reciprocal gait springs to KAFO; will message Dr. Berline Chough about bone density.    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

## 2020-06-19 ENCOUNTER — Ambulatory Visit: Payer: BC Managed Care – PPO | Attending: Physical Medicine and Rehabilitation | Admitting: Physical Therapy

## 2020-06-19 ENCOUNTER — Other Ambulatory Visit: Payer: Self-pay

## 2020-06-19 DIAGNOSIS — R208 Other disturbances of skin sensation: Secondary | ICD-10-CM | POA: Insufficient documentation

## 2020-06-19 DIAGNOSIS — R29818 Other symptoms and signs involving the nervous system: Secondary | ICD-10-CM | POA: Diagnosis present

## 2020-06-19 DIAGNOSIS — G8221 Paraplegia, complete: Secondary | ICD-10-CM | POA: Diagnosis present

## 2020-06-19 DIAGNOSIS — R2681 Unsteadiness on feet: Secondary | ICD-10-CM | POA: Insufficient documentation

## 2020-06-19 NOTE — Therapy (Signed)
[facility] [address] [place], [place], [address] Phone: [phone]   Fax:  [phone]  Physical Therapy Treatment  Patient Details  Name: Luke Velasquez MRN: [id number] Date of Birth: 2000-10-10 Referring Provider (PT): Curlene Dolphin, MD (Physician in [place]; care has been transferred to Genice Rouge, MD   Encounter Date: 06/19/2020   PT End of Session - 06/19/20 2216    Visit Number 6    Number of Visits 21    Date for PT Re-Evaluation 07/30/20    Authorization Type BCBS    Authorization Time Period VL: 60 PT/OT/SLP combined    Authorization - Visit Number 6    Authorization - Number of Visits 60    PT Start Time 1017    PT Stop Time 1100    PT Time Calculation (min) 43 min    Equipment Utilized During Treatment Other (comment)   bil KAFO   Activity Tolerance Patient tolerated treatment well    Behavior During Therapy [facility] for tasks assessed/performed           No past medical history on file.  No past surgical history on file.  There were no vitals filed for this visit.   Subjective Assessment - 06/19/20 2236    Subjective No new issues. No pain or falls. Felt okay after last session.    Patient is accompained by: Family member   mom   Pertinent History paraplegia, colostomy, spasticity, neurogenic bowel and bladder, chronic pain, pressure ulcers    Patient Stated Goals To be able to walk around the house with HKAFO's.  Pt owns a standard walker.    Currently in Pain? No/denies               [facility] Adult PT Treatment/Exercise - 06/19/20 2239      Transfers   Transfers Sit to Stand;Stand to Sit;Lateral/Scoot Transfers    Sit to Stand 3: Mod assist;4: Min assist;With upper extremity assist;From bed;From chair/3-in-1    Sit to Stand Details Verbal cues for sequencing;Verbal cues for technique;Verbal cues for safe use of DME/AE    Sit to Stand Details (indicate cue type and reason) mat<>RW x  2 reps with single brace locked out, other using rachet function to lock out as pt stood. cues for hand placment with one hand on walker, other on mat table. assist at pelvis for control and stability with transition into standing; in parallel bars wheelchair>bars with min assist. had pt stand with bil braces in bent position using rachet function to gradually lock out as he stood up. gentle overpressure to left knee upon standing needed to achieve full extension on this side after pt had walked hands forward for pelvis forward to complete lock out.              Stand to Sit 3: Mod assist;With upper extremity assist;To bed;To chair/3-in-1    Stand to Sit Details (indicate cue type and reason) Verbal cues for sequencing;Verbal cues for technique;Verbal cues for safe use of DME/AE    Stand to Sit Details to both mat table and wheelchair- pt "bowing" down with use of UE's to lower self down with PTA assisting with bringing bil LE's out due to being locked into exension. Once seated bil braces unlocked by patient to assist with balance and sitting support.     Lateral/Scoot Transfers 5: Supervision    Lateral/Scoot Transfer Details (indicate cue type and reason) wheelchair to mat no braces on, then mat  to wheelchair with braces on      Ambulation/Gait   Ambulation/Gait Yes    Ambulation/Gait Assistance 4: Min assist;3: Mod assist   with second person for safety/min assist   Ambulation/Gait Assistance Details in parallel bars with min/mod assist for gait with cues on posture, weight shifting and shouder/trunk/hip movements to assist with LE advancement. less assisatance needed with second gait rep after visual demo of technique used with reciprocal/hinged gait.     Ambulation Distance (Feet) 10 Feet   x2   Assistive device Parallel bars;Other (Comment)   bil KAFO's   Gait Pattern Step-through pattern;Decreased step length - right;Decreased step length - left;Decreased stride length    Ambulation Surface  Level;Indoor      Therapeutic Activites    Therapeutic Activities Other Therapeutic Activities    Other Therapeutic Activities in standing with bil braces at RW: performed over 2 stands- alt UE raises, bil UE raises, LE fwd/bwd stepping. cues needed on posture with all standing activites.  cues/facilitaion on posture and weight shifting. also worked on unsuppored standing for max stand time of 10 seconds over several attempts. min to mod assist with all standing ex's with 2cd person for safety.                                       PT Short Term Goals - 05/21/20 2121      PT SHORT TERM GOAL #1   Title Pt will demonstrate independence with initial HEP    Time 5    Period Weeks    Status New    Target Date 06/25/20      PT SHORT TERM GOAL #2   Title Pt will demonstrate ability to transfer sit > stand from mat or wheelchair with HKAFO's with min A    Time 5    Period Weeks    Status New    Target Date 06/25/20      PT SHORT TERM GOAL #3   Title Pt will ambulate down and back in parallel bars x 3 trips with min A    Time 5    Period Weeks    Status New    Target Date 06/25/20      PT SHORT TERM GOAL #4   Title Pt will be able to sit without back support for >10 minutes while performing dynamic activities with bilat UE with close supervision    Time 5    Period Weeks    Status New    Target Date 06/25/20             PT Long Term Goals - 05/21/20 2126      PT LONG TERM GOAL #1   Title Pt will demonstrate independence with final HEP    Time 10    Period Weeks    Status New    Target Date 07/30/20      PT LONG TERM GOAL #2   Title Pt will participate in w/c and seating evaluation for more appropriate manual wheelchair    Time 10    Period Weeks    Status New    Target Date 07/30/20      PT LONG TERM GOAL #3   Title Pt will perform sit > stand with HKAFO's and LRAD with supervision    Time 10    Period Weeks    Status New    Target  Date 07/30/20      PT  LONG TERM GOAL #4   Title Pt will ambulate x 115' with HKAFO's and LRAD over level, indoor surfaces and min A    Time 10    Period Weeks    Status New    Target Date 07/30/20                 Plan - 06/19/20 2217    Clinical Impression Statement Today's skilled session continued to focus on transfers, standing balance and gait in parallel bars with bil KAFO's donned. Pt needed decreased assistance for all mobility this session. The pt is progressing toward goals and should benefit from continued PT to progress toward unmet goals.    Personal Factors and Comorbidities Comorbidity 3+;Past/Current Experience;Transportation    Comorbidities paraplegia, colostomy, spasticity, neurogenic bowel and bladder, chronic pain, pressure ulcers    Examination-Activity Limitations Locomotion Level;Stand    Examination-Participation Restrictions Community Activity;Driving    Stability/Clinical Decision Making Evolving/Moderate complexity    Rehab Potential Good    PT Frequency 2x / week    PT Duration Other (comment)   10 weeks   PT Treatment/Interventions ADLs/Self Care Home Management;DME Instruction;Gait training;Functional mobility training;Therapeutic activities;Therapeutic exercise;Balance training;Neuromuscular re-education;Patient/family education;Orthotic Fit/Training;Wheelchair mobility training    PT Next Visit Plan with bil KAFO's donned- begin to work on standing at standard walker with braces and mom support/assist to progress to standing at home with pt's walker, plus work on standing balance with decreased UE support, gait in parallel bars vs with BWS system vs with RW; continue to work without braces on unsupported sitting balance. add to HEP as needed.    Recommended Other Services 06/19/20: Have called Trey Paula at [facility], awaiting response. Contacted Dr. Berline Chough about bone density and weight loss. She advised milk shakes with ice cream and protein powder to assist with calories and weight gain  as pt no longer likes Ensure due to having too many at hospital.    Consulted and Agree with Plan of Care Patient;Family member/caregiver    Family Member Consulted Mother - Delaney Meigs           Patient will benefit from skilled therapeutic intervention in order to improve the following deficits and impairments:  Abnormal gait, Decreased balance, Decreased strength, Difficulty walking, Impaired sensation, Impaired tone  Visit Diagnosis: Paraplegia, complete (HCC)  Unsteadiness on feet  Other disturbances of skin sensation  Other symptoms and signs involving the nervous system     Problem List Patient Active Problem List   Diagnosis Date Noted  . 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 Paraplegia (HCC) 05/16/2020  . Spasticity 05/16/2020  . Neurogenic bladder 05/16/2020  . Wheelchair dependence 05/16/2020    Sallyanne Kuster, PTA, [facility] [address], [address] [place], [place] [address] [phone] 06/19/20, 10:53 PM   Name: Luke Velasquez MRN: [id number] Date of Birth: 2001-03-13

## 2020-06-20 ENCOUNTER — Ambulatory Visit: Payer: BC Managed Care – PPO | Admitting: Physical Therapy

## 2020-06-20 ENCOUNTER — Encounter: Payer: Self-pay | Admitting: Physical Therapy

## 2020-06-20 DIAGNOSIS — G8221 Paraplegia, complete: Secondary | ICD-10-CM | POA: Diagnosis not present

## 2020-06-20 DIAGNOSIS — R208 Other disturbances of skin sensation: Secondary | ICD-10-CM

## 2020-06-20 DIAGNOSIS — R29818 Other symptoms and signs involving the nervous system: Secondary | ICD-10-CM

## 2020-06-20 DIAGNOSIS — R2681 Unsteadiness on feet: Secondary | ICD-10-CM

## 2020-06-20 NOTE — Therapy (Signed)
[facility] [address] [place], [place], [address] Phone: [phone]   Fax:  [phone]  Physical Therapy Treatment  Patient Details  Name: Luke Velasquez MRN: [id number] Date of Birth: 2001-05-05 Referring Provider (PT): Curlene Dolphin, MD (Physician in [place]; care has been transferred to Genice Rouge, MD   Encounter Date: 06/20/2020   PT End of Session - 06/20/20 1706    Visit Number 7    Number of Visits 21    Date for PT Re-Evaluation 07/30/20    Authorization Type BCBS    Authorization Time Period VL: 60 PT/OT/SLP combined    Authorization - Visit Number 7    Authorization - Number of Visits 60    PT Start Time 1320    PT Stop Time 1400    PT Time Calculation (min) 40 min    Equipment Utilized During Treatment Other (comment)   bil KAFO, BWS   Activity Tolerance Patient tolerated treatment well    Behavior During Therapy [facility] for tasks assessed/performed           History reviewed. No pertinent past medical history.  History reviewed. No pertinent surgical history.  There were no vitals filed for this visit.   Subjective Assessment - 06/20/20 1657    Subjective Goes next week to [place] to meet with the whole team; will be meeting with dietician.    Patient is accompained by: Family member   mom   Pertinent History paraplegia, colostomy, spasticity, neurogenic bowel and bladder, chronic pain, pressure ulcers    Patient Stated Goals To be able to walk around the house with HKAFO's.  Pt owns a standard walker.    Currently in Pain? No/denies                             [facility] Adult PT Treatment/Exercise - 06/20/20 1658      Transfers   Transfers Sit to Stand;Stand to Sit;Lateral/Scoot Transfers    Sit to Stand 4: Min assist    Sit to Stand Details (indicate cue type and reason) from mat and w/c with R KAFO locked in extension and LLE progressively extending as pt  stands.  Only required assistance to block L knee during sit > stand and ensure KAFO was locked in full extension    Stand to Sit 3: Mod assist    Stand to Sit Details therapist assisted with lifting LE off floor with knees locked in extension as pt sat back into wheelchair.  Pt able to unlock knees once seated    Lateral/Scoot Transfers 6: Modified independent (Device/Increase time)      Ambulation/Gait   Ambulation/Gait Yes    Ambulation/Gait Assistance 3: Mod assist    Ambulation/Gait Assistance Details with bilat KAFO and UE supported on bars on [employer] BWS without harness performed gait over ground.  PT and SPT assisted with moving and stabilizing rolling BWS with primary PT behind patient for safety and assisting with preventing KAFO's from catching if one LE adducted.  Therapist also provided cues for weight shift and trunk extension to advance each LE.  Therapist provided facilitation at hips/pelvis for weight shift and hip flexion.    Ambulation Distance (Feet) 115 Feet   x 2   Assistive device Body weight support system    Gait Pattern Step-through pattern;Step-to pattern   LE locked into extension with KAFOs   Ambulation Surface Level;Indoor  Therapeutic Activites    Therapeutic Activities Other Therapeutic Activities    Other Therapeutic Activities educated pt and mother on information from physiatrist including ways to increase calorie intake (Ensure with icecream mixed as a milk shake) and signs/symptoms of LE fracture (Autonomic dysreflexia)                  PT Education - 06/20/20 1706    Education Details see TA    Person(s) Educated Patient;Parent(s)    Methods Explanation    Comprehension Verbalized understanding            PT Short Term Goals - 05/21/20 2121      PT SHORT TERM GOAL #1   Title Pt will demonstrate independence with initial HEP    Time 5    Period Weeks    Status New    Target Date 06/25/20      PT SHORT TERM GOAL #2   Title Pt will  demonstrate ability to transfer sit > stand from mat or wheelchair with HKAFO's with min A    Time 5    Period Weeks    Status New    Target Date 06/25/20      PT SHORT TERM GOAL #3   Title Pt will ambulate down and back in parallel bars x 3 trips with min A    Time 5    Period Weeks    Status New    Target Date 06/25/20      PT SHORT TERM GOAL #4   Title Pt will be able to sit without back support for >10 minutes while performing dynamic activities with bilat UE with close supervision    Time 5    Period Weeks    Status New    Target Date 06/25/20             PT Long Term Goals - 05/21/20 2126      PT LONG TERM GOAL #1   Title Pt will demonstrate independence with final HEP    Time 10    Period Weeks    Status New    Target Date 07/30/20      PT LONG TERM GOAL #2   Title Pt will participate in w/c and seating evaluation for more appropriate manual wheelchair    Time 10    Period Weeks    Status New    Target Date 07/30/20      PT LONG TERM GOAL #3   Title Pt will perform sit > stand with HKAFO's and LRAD with supervision    Time 10    Period Weeks    Status New    Target Date 07/30/20      PT LONG TERM GOAL #4   Title Pt will ambulate x 115' with HKAFO's and LRAD over level, indoor surfaces and min A    Time 10    Period Weeks    Status New    Target Date 07/30/20                 Plan - 06/20/20 1707    Clinical Impression Statement Pt able to progress to use of bilat KAFOs and body weight support system for over ground gait training.  Pt able to complete two laps around gym with primary therapist assisting at pelvis/hips (Other two therapists assisting with rolling BWS) with one seated rest break.  Second lap pt demonstrated improved ability to weight shift and utilize trunk extension to advance each  LE.  As pt fatigued he demonstrated increased trunk flexion and pushing up through UE.  Will continue to address and progress towards less restricted  AD.    Personal Factors and Comorbidities Comorbidity 3+;Past/Current Experience;Transportation    Comorbidities paraplegia, colostomy, spasticity, neurogenic bowel and bladder, chronic pain, pressure ulcers    Examination-Activity Limitations Locomotion Level;Stand    Examination-Participation Restrictions Community Activity;Driving    Stability/Clinical Decision Making Evolving/Moderate complexity    Rehab Potential Good    PT Frequency 2x / week    PT Duration Other (comment)   10 weeks   PT Treatment/Interventions ADLs/Self Care Home Management;DME Instruction;Gait training;Functional mobility training;Therapeutic activities;Therapeutic exercise;Balance training;Neuromuscular re-education;Patient/family education;Orthotic Fit/Training;Wheelchair mobility training    PT Next Visit Plan Check STG.  with bil KAFO's donned- begin to work on standing at standard walker with braces and mom support/assist to progress to standing at home with pt's walker, plus work on standing balance with decreased UE support, gait with BWS system vs with RW; continue to work without braces on unsupported sitting balance. add to HEP as needed.    Consulted and Agree with Plan of Care Patient;Family member/caregiver    Family Member Consulted Mother - Delaney Meigs           Patient will benefit from skilled therapeutic intervention in order to improve the following deficits and impairments:  Abnormal gait, Decreased balance, Decreased strength, Difficulty walking, Impaired sensation, Impaired tone  Visit Diagnosis: Paraplegia, complete (HCC)  Unsteadiness on feet  Other disturbances of skin sensation  Other symptoms and signs involving the nervous system     Problem List Patient Active Problem List   Diagnosis Date Noted  . 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 Paraplegia (HCC) 05/16/2020  . Spasticity 05/16/2020  . Neurogenic bladder 05/16/2020  . Wheelchair dependence 05/16/2020    Dierdre Highman,  PT, DPT 06/20/20    5:11 PM    [facility] [address] [place], [place], [address] Phone: [phone]   Fax:  [phone]  Name: Luke Velasquez MRN: [id number] Date of Birth: [birth year]

## 2020-06-26 ENCOUNTER — Other Ambulatory Visit: Payer: Self-pay

## 2020-06-26 ENCOUNTER — Encounter: Payer: Self-pay | Admitting: Physical Therapy

## 2020-06-26 ENCOUNTER — Ambulatory Visit: Payer: BC Managed Care – PPO | Admitting: Physical Therapy

## 2020-06-26 DIAGNOSIS — R208 Other disturbances of skin sensation: Secondary | ICD-10-CM

## 2020-06-26 DIAGNOSIS — R2681 Unsteadiness on feet: Secondary | ICD-10-CM

## 2020-06-26 DIAGNOSIS — G8221 Paraplegia, complete: Secondary | ICD-10-CM

## 2020-06-26 DIAGNOSIS — R29818 Other symptoms and signs involving the nervous system: Secondary | ICD-10-CM

## 2020-06-26 NOTE — Therapy (Signed)
[facility] [address] [place], [place], [address] Phone: [phone]   Fax:  [phone]  Physical Therapy Treatment  Patient Details  Name: Luke Velasquez MRN: [id number] Date of Birth: [birth year] Referring Provider (PT): Glennie [place], MD (Physician in [place]; care has been transferred to Courtney Heys, MD   Encounter Date: 06/26/2020   PT End of Session - 06/26/20 1137    Visit Number 8    Number of Visits 21    Date for PT Re-Evaluation 07/30/20    Authorization Type BCBS    Authorization Time Period VL: 60 PT/OT/SLP combined    Authorization - Visit Number 8    Authorization - Number of Visits 60    PT Start Time 0933    PT Stop Time 1015    PT Time Calculation (min) 42 min    Activity Tolerance Patient tolerated treatment well    Behavior During Therapy [facility] for tasks assessed/performed           History reviewed. No pertinent past medical history.  History reviewed. No pertinent surgical history.  There were no vitals filed for this visit.   Subjective Assessment - 06/26/20 0938    Subjective Tub bench has not come in yet.  Doing well; was sore in core after walking a lot last session.  Goes to see Dr. Dagoberto Ligas tomorrow.    Patient is accompained by: Family member   mom   Pertinent History paraplegia, colostomy, spasticity, neurogenic bowel and bladder, chronic pain, pressure ulcers    Patient Stated Goals To be able to walk around the house with HKAFO's.  Pt owns a standard walker.    Currently in Pain? No/denies                             [facility] Adult PT Treatment/Exercise - 06/26/20 1130      Exercises   Exercises Shoulder    Other Exercises  Performed prone exercises as documented below and then transitioned to quadruped with blue peanute ball under abdomen for support.  Performed 10 reps alternating UE flexion raises with min A to support LUE.  When attempting to perform  alternating horizontal ABD pt reported instability in L elbow and pain/pressure in L wrist in area of previous surgery.  Attempted to have pt adjust hand position on mat, therapist stabilize L elbow or using push up blocks but pt unable to tolerate.  Completed quadruped over ball tricep push ups transitioning from elbow <> hands x 10 reps      Shoulder Exercises: ROM/Strengthening   UBE (Upper Arm Bike) Level 5.5 resistance x 4 minutes forwards and 4 minutes backwards at 45 revolutions per minute            Access Code: [email or web address] URL: [email or web address] Date: 06/26/2020 Prepared by: Misty Stanley  Exercises Prone Scapular Retraction Y - 1 x daily - 7 x weekly - 2 sets - 10 reps Prone Scapular Slide with Shoulder Extension - 1 x daily - 7 x weekly - 2 sets - 10 reps Prone Shoulder Horizontal Abduction - 1 x daily - 7 x weekly - 2 sets - 10 reps        PT Education - 06/26/20 1130    Education Details prone UE and back strengthening HEP    Person(s) Educated Patient    Methods Explanation    Comprehension Verbalized understanding  PT Short Term Goals - 06/26/20 1140      PT SHORT TERM GOAL #1   Title Pt will demonstrate independence with initial HEP    Time 5    Period Weeks    Status Achieved    Target Date 06/25/20      PT SHORT TERM GOAL #2   Title Pt will demonstrate ability to transfer sit > stand from mat or wheelchair with HKAFO's with min A    Time 5    Period Weeks    Status Achieved    Target Date 06/25/20      PT SHORT TERM GOAL #3   Title Pt will ambulate down and back in parallel bars x 3 trips with min A    Time 5    Period Weeks    Status Achieved    Target Date 06/25/20      PT SHORT TERM GOAL #4   Title Pt will be able to sit without back support for >10 minutes while performing dynamic activities with bilat UE with close supervision    Time 5    Period Weeks    Status Achieved    Target Date 06/25/20              PT Long Term Goals - 05/21/20 2126      PT LONG TERM GOAL #1   Title Pt will demonstrate independence with final HEP    Time 10    Period Weeks    Status New    Target Date 07/30/20      PT LONG TERM GOAL #2   Title Pt will participate in w/c and seating evaluation for more appropriate manual wheelchair    Time 10    Period Weeks    Status New    Target Date 07/30/20      PT LONG TERM GOAL #3   Title Pt will perform sit > stand with HKAFO's and LRAD with supervision    Time 10    Period Weeks    Status New    Target Date 07/30/20      PT LONG TERM GOAL #4   Title Pt will ambulate x 115' with HKAFO's and LRAD over level, indoor surfaces and min A    Time 10    Period Weeks    Status New    Target Date 07/30/20                 Plan - 06/26/20 1137    Clinical Impression Statement Unable to utilize BWS for gait training today due to space limitations; continued to focus on functional aerobic endurance, UE and upper back strengthening with UBE, prone shoulder exercises and WB through UE in supported quadruped.  Pt less able to tolerate quadruped today due to pain in L elbow and wrist.  Provided pt with prone exercises to perform at home.  Pt is demonstrating good progress and has met all STG.    Personal Factors and Comorbidities Comorbidity 3+;Past/Current Experience;Transportation    Comorbidities paraplegia, colostomy, spasticity, neurogenic bowel and bladder, chronic pain, pressure ulcers    Examination-Activity Limitations Locomotion Level;Stand    Examination-Participation Restrictions Community Activity;Driving    Stability/Clinical Decision Making Evolving/Moderate complexity    Rehab Potential Good    PT Frequency 2x / week    PT Duration Other (comment)   10 weeks   PT Treatment/Interventions ADLs/Self Care Home Management;DME Instruction;Gait training;Functional mobility training;Therapeutic activities;Therapeutic exercise;Balance  training;Neuromuscular re-education;Patient/family education;Orthotic Fit/Training;Wheelchair mobility  training    PT Next Visit Plan how is LUE?  with bil KAFO's donned- begin to work on standing at standard walker with braces and mom support/assist to progress to standing at home with pt's walker, plus work on standing balance with decreased UE support, gait with BWS system vs with RW; continue to work without braces on unsupported sitting balance. add to HEP as needed.    Consulted and Agree with Plan of Care Patient;Family member/caregiver    Family Member Consulted Mother - Jonelle Sidle           Patient will benefit from skilled therapeutic intervention in order to improve the following deficits and impairments:  Abnormal gait,Decreased balance,Decreased strength,Difficulty walking,Impaired sensation,Impaired tone  Visit Diagnosis: Paraplegia, complete ([place])  Unsteadiness on feet  Other disturbances of skin sensation  Other symptoms and signs involving the nervous system     Problem List Patient Active Problem List   Diagnosis Date Noted  . 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 Paraplegia ([place]) 05/16/2020  . Spasticity 05/16/2020  . Neurogenic bladder 05/16/2020  . Wheelchair dependence 05/16/2020    Rico Junker, PT, DPT 06/26/20    11:42 AM    [facility] [address] [place], [place], [address] Phone: [phone]   Fax:  [phone]  Name: Luke Velasquez MRN: [id number] Date of Birth: 06/06/2001

## 2020-06-26 NOTE — Patient Instructions (Signed)
Access Code: [email or web address] seated UE exercises; [email or web address] Prone UE exercises

## 2020-06-27 ENCOUNTER — Other Ambulatory Visit: Payer: Self-pay

## 2020-06-27 ENCOUNTER — Ambulatory Visit: Payer: BC Managed Care – PPO | Admitting: Physical Therapy

## 2020-06-27 ENCOUNTER — Encounter
Payer: BC Managed Care – PPO | Attending: Physical Medicine and Rehabilitation | Admitting: Physical Medicine and Rehabilitation

## 2020-06-27 ENCOUNTER — Encounter: Payer: Self-pay | Admitting: Physical Medicine and Rehabilitation

## 2020-06-27 VITALS — BP 122/81 | HR 88 | Temp 98.2°F | Ht 74.0 in | Wt 125.0 lb

## 2020-06-27 DIAGNOSIS — L98421 Non-pressure chronic ulcer of back limited to breakdown of skin: Secondary | ICD-10-CM | POA: Diagnosis not present

## 2020-06-27 DIAGNOSIS — R252 Cramp and spasm: Secondary | ICD-10-CM | POA: Insufficient documentation

## 2020-06-27 DIAGNOSIS — R2681 Unsteadiness on feet: Secondary | ICD-10-CM

## 2020-06-27 DIAGNOSIS — G8221 Paraplegia, complete: Secondary | ICD-10-CM

## 2020-06-27 DIAGNOSIS — G8220 Paraplegia, unspecified: Secondary | ICD-10-CM | POA: Diagnosis present

## 2020-06-27 DIAGNOSIS — R208 Other disturbances of skin sensation: Secondary | ICD-10-CM

## 2020-06-27 DIAGNOSIS — Z993 Dependence on wheelchair: Secondary | ICD-10-CM | POA: Insufficient documentation

## 2020-06-27 DIAGNOSIS — R29818 Other symptoms and signs involving the nervous system: Secondary | ICD-10-CM

## 2020-06-27 DIAGNOSIS — N319 Neuromuscular dysfunction of bladder, unspecified: Secondary | ICD-10-CM | POA: Diagnosis present

## 2020-06-27 MED ORDER — BACLOFEN 10 MG PO TABS: 10.0000 mg | ORAL_TABLET | Freq: Three times a day (TID) | ORAL | 3 refills | Status: DC
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
## 2020-06-28 LAB — COMPREHENSIVE METABOLIC PANEL
ALT: 26 IU/L (ref 0–44)
AST: 24 IU/L (ref 0–40)
Albumin/Globulin Ratio: 1.5 (ref 1.2–2.2)
Albumin: 4.4 g/dL (ref 4.1–5.2)
Alkaline Phosphatase: 187 IU/L — ABNORMAL HIGH (ref 51–125)
BUN/Creatinine Ratio: 19 (ref 9–20)
BUN: 14 mg/dL (ref 6–20)
Bilirubin Total: <0.2 mg/dL (ref 0.0–1.2)
CO2: 26 mmol/L (ref 20–29)
Calcium: 9.7 mg/dL (ref 8.7–10.2)
Chloride: 102 mmol/L (ref 96–106)
Creatinine, Ser: 0.75 mg/dL — ABNORMAL LOW (ref 0.76–1.27)
GFR calc Af Amer: 155 mL/min/{1.73_m2} (ref >59)
GFR calc non Af Amer: 134 mL/min/{1.73_m2} (ref >59)
Globulin, Total: 2.9 g/dL (ref 1.5–4.5)
Glucose: 87 mg/dL (ref 65–99)
Potassium: 4.1 mmol/L (ref 3.5–5.2)
Sodium: 140 mmol/L (ref 134–144)
Total Protein: 7.3 g/dL (ref 6.0–8.5)

## 2020-06-29 NOTE — Therapy (Signed)
[facility] [address] [place], [place], [address] Phone: [phone]   Fax:  [phone]  Physical Therapy Treatment  Patient Details  Name: Luke Velasquez MRN: [id number] Date of Birth: 10-Oct-2000 Referring Provider (PT): Curlene Dolphin, MD (Physician in [place]; care has been transferred to Genice Rouge, MD   Encounter Date: 06/27/2020   PT End of Session - 06/29/20 1756    Visit Number 9    Number of Visits 21    Date for PT Re-Evaluation 07/30/20    Authorization Type BCBS    Authorization Time Period VL: 60 PT/OT/SLP combined    Authorization - Visit Number 9    Authorization - Number of Visits 60    PT Start Time 1230    PT Stop Time 1315    PT Time Calculation (min) 45 min    Equipment Utilized During Treatment Other (comment)   bilat KAFOs   Activity Tolerance Patient tolerated treatment well    Behavior During Therapy [facility] for tasks assessed/performed           No past medical history on file.  No past surgical history on file.  There were no vitals filed for this visit.   Subjective Assessment - 06/29/20 1745    Subjective Pt still has wound that continues to re-open, has been referred to wound center.  Dr. Berline Chough is recommending compression stockings to prevent post DVT edema.  No soreness in UE from previous session.    Patient is accompained by: Family member   mom   Pertinent History paraplegia, colostomy, spasticity, neurogenic bowel and bladder, chronic pain, pressure ulcers    Patient Stated Goals To be able to walk around the house with HKAFO's.  Pt owns a standard walker.    Currently in Pain? No/denies                             [facility] Adult PT Treatment/Exercise - 06/29/20 1747      Transfers   Transfers Squat Pivot Transfers    Sit to Stand 4: Min assist    Sit to Stand Details (indicate cue type and reason) from mat and from w/c with KAFO's donned and UE  support on RW.  Pt locked R KAFO in extension prior to standing and during sit > stand L KAFO would "ratchet" into extension; therapist provided support to stabilize RW and to block L knee    Stand to Sit 3: Mod assist    Stand to Sit Details assistance to bow forwards and allow LE (locked in extension) to extend out in front of him during stand > sit in wheelchair during rest breaks    Squat Pivot Transfers 6: Modified independent (Device/Increase time)      Ambulation/Gait   Ambulation/Gait Yes    Ambulation/Gait Assistance 3: Mod assist    Ambulation/Gait Assistance Details due to space, unable to use BWS; transitioned to bariatric RW.  Pt performed LE advancement with anterior weight shift with trunk extension but after pushing RW forwards pt would have to lean trunk extremely far forwards (due to long stride) to initiate weight shift over stance LE.  Attempted to take shorter steps due to UE fatigue from pushing through RW; required mod A to prevent LE from adducting or rotating and intermittent assistance to rotate pelvis to assist with LE advancement.  Required multiple standing rest breaks and one seated rest break due to UE fatigue  and cramping in triceps.    Ambulation Distance (Feet) 55 Feet   x2   Assistive device Rolling walker;Other (Comment)   bilat KAFOs   Gait Pattern Step-through pattern;Step-to pattern    Ambulation Surface Level;Indoor      Therapeutic Activites    Therapeutic Activities Other Therapeutic Activities    Other Therapeutic Activities Measured patient's LE around ankle, calf and length of lower leg for knee high compression stockings.  Provided measurements to patient's mother who is going to order stockings off the internet.                  PT Education - 06/29/20 1755    Education Details sizing compression stockings    Person(s) Educated Patient;Parent(s)    Methods Explanation    Comprehension Verbalized understanding            PT Short  Term Goals - 06/26/20 1140      PT SHORT TERM GOAL #1   Title Pt will demonstrate independence with initial HEP    Time 5    Period Weeks    Status Achieved    Target Date 06/25/20      PT SHORT TERM GOAL #2   Title Pt will demonstrate ability to transfer sit > stand from mat or wheelchair with HKAFO's with min A    Time 5    Period Weeks    Status Achieved    Target Date 06/25/20      PT SHORT TERM GOAL #3   Title Pt will ambulate down and back in parallel bars x 3 trips with min A    Time 5    Period Weeks    Status Achieved    Target Date 06/25/20      PT SHORT TERM GOAL #4   Title Pt will be able to sit without back support for >10 minutes while performing dynamic activities with bilat UE with close supervision    Time 5    Period Weeks    Status Achieved    Target Date 06/25/20             PT Long Term Goals - 05/21/20 2126      PT LONG TERM GOAL #1   Title Pt will demonstrate independence with final HEP    Time 10    Period Weeks    Status New    Target Date 07/30/20      PT LONG TERM GOAL #2   Title Pt will participate in w/c and seating evaluation for more appropriate manual wheelchair    Time 10    Period Weeks    Status New    Target Date 07/30/20      PT LONG TERM GOAL #3   Title Pt will perform sit > stand with HKAFO's and LRAD with supervision    Time 10    Period Weeks    Status New    Target Date 07/30/20      PT LONG TERM GOAL #4   Title Pt will ambulate x 115' with HKAFO's and LRAD over level, indoor surfaces and min A    Time 10    Period Weeks    Status New    Target Date 07/30/20                 Plan - 06/29/20 1756    Clinical Impression Statement Transitioned to heavy duty RW for gait training with KAFO's today.  Use of RW resulted  in increased demand on patient's UE and he required increased standing rest breaks and one seated rest break; unable to perform full lap around gym.  Will continued to address and progress  towards LTG. Plan is still to have pt assessed for possible RGO.    Personal Factors and Comorbidities Comorbidity 3+;Past/Current Experience;Transportation    Comorbidities paraplegia, colostomy, spasticity, neurogenic bowel and bladder, chronic pain, pressure ulcers    Examination-Activity Limitations Locomotion Level;Stand    Examination-Participation Restrictions Community Activity;Driving    Stability/Clinical Decision Making Evolving/Moderate complexity    Rehab Potential Good    PT Frequency 2x / week    PT Duration Other (comment)   10 weeks   PT Treatment/Interventions ADLs/Self Care Home Management;DME Instruction;Gait training;Functional mobility training;Therapeutic activities;Therapeutic exercise;Balance training;Neuromuscular re-education;Patient/family education;Orthotic Fit/Training;Wheelchair mobility training    PT Next Visit Plan with bil KAFO's donned- begin to work on standing at standard walker with braces and mom support/assist to progress to standing at home with pt's walker, plus work on standing balance with decreased UE support, gait with BWS system vs with wide RW;  UE and upper back strengthening, endurance on UBE, continue to work without braces on unsupported sitting balance. add to HEP as needed.    Consulted and Agree with Plan of Care Patient;Family member/caregiver    Family Member Consulted Mother - Delaney Meigs           Patient will benefit from skilled therapeutic intervention in order to improve the following deficits and impairments:  Abnormal gait,Decreased balance,Decreased strength,Difficulty walking,Impaired sensation,Impaired tone  Visit Diagnosis: Paraplegia, complete ([facility])  Unsteadiness on feet  Other disturbances of skin sensation  Other symptoms and signs involving the nervous system     Problem List Patient Active Problem List   Diagnosis Date Noted  . 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 Paraplegia ([facility]) 05/16/2020  . Spasticity  05/16/2020  . Neurogenic bladder 05/16/2020  . Wheelchair dependence 05/16/2020    Dierdre Highman, PT, DPT 06/29/20    10:20 PM   [facility] [address] [place], [place], [address] Phone: [phone]   Fax:  [phone]  Name: Luke Velasquez MRN: [id number] Date of Birth: [birth year]

## 2020-07-03 ENCOUNTER — Ambulatory Visit: Payer: BC Managed Care – PPO | Admitting: Physical Therapy

## 2020-07-04 ENCOUNTER — Ambulatory Visit: Payer: BC Managed Care – PPO | Admitting: Physical Therapy

## 2020-07-08 ENCOUNTER — Ambulatory Visit: Payer: BC Managed Care – PPO | Admitting: Physical Therapy

## 2020-07-08 ENCOUNTER — Encounter: Payer: Self-pay | Admitting: Physical Therapy

## 2020-07-08 ENCOUNTER — Other Ambulatory Visit: Payer: Self-pay

## 2020-07-08 DIAGNOSIS — R2681 Unsteadiness on feet: Secondary | ICD-10-CM

## 2020-07-08 DIAGNOSIS — G8221 Paraplegia, complete: Secondary | ICD-10-CM

## 2020-07-08 DIAGNOSIS — R208 Other disturbances of skin sensation: Secondary | ICD-10-CM

## 2020-07-08 DIAGNOSIS — R29818 Other symptoms and signs involving the nervous system: Secondary | ICD-10-CM

## 2020-07-08 NOTE — Therapy (Signed)
[facility] [address] [place], [place], [address] Phone: [phone]   Fax:  [phone]  Physical Therapy Treatment  Patient Details  Name: Luke Velasquez MRN: [id number] Date of Birth: 02-22-2001 Referring Provider (PT): Curlene Dolphin, MD (Physician in [place]; care has been transferred to Genice Rouge, MD   Encounter Date: 07/08/2020   PT End of Session - 07/08/20 2057    Visit Number 10    Number of Visits 21    Date for PT Re-Evaluation 07/30/20    Authorization Type BCBS    Authorization Time Period VL: 60 PT/OT/SLP combined    Authorization - Visit Number 10    Authorization - Number of Visits 60    PT Start Time 1537    PT Stop Time 1620    PT Time Calculation (min) 43 min    Equipment Utilized During Treatment Other (comment)   bilat KAFOs   Activity Tolerance Patient tolerated treatment well    Behavior During Therapy [facility] for tasks assessed/performed           History reviewed. No pertinent past medical history.  History reviewed. No pertinent surgical history.  There were no vitals filed for this visit.   Subjective Assessment - 07/08/20 2039    Subjective Was exhausted after all day appointment at [facility] last week.  Received an order for new w/c, standing frame and padded tub bench.  Asking if they should go to [facility] for those referrals or can they do it here?  Tub bench that was ordered has not arrived.  Pt unsure if compression stockings have been ordered; edema in LLE today.  Alerted pt and mother that appointment with orthotist is 07/24/20.    Patient is accompained by: Family member   mom   Pertinent History paraplegia, colostomy, spasticity, neurogenic bowel and bladder, chronic pain, pressure ulcers    Patient Stated Goals To be able to walk around the house with HKAFO's.  Pt owns a standard walker.    Currently in Pain? No/denies                             [facility]  Adult PT Treatment/Exercise - 07/08/20 2042      Transfers   Transfers Squat Pivot Transfers;Stand to Sit;Sit to Supine    Sit to Stand 4: Min assist    Sit to Stand Details (indicate cue type and reason) from mat or w/c with R knee locked in extension, L knee ratcheting into extension during sit > stand, therapist blocked L knee during sit > stand    Stand to Sit 3: Mod assist    Stand to Sit Details cues to bow; assistance to lift both LE during stand > sit; knees flexed once sitting    Lateral/Scoot Transfers 6: Modified independent (Device/Increase time)    Comments Donned KAFO's edge of mat      Ambulation/Gait   Ambulation/Gait Yes    Ambulation/Gait Assistance Other (comment)   3 therapists; 2 to steer BWS   Ambulation/Gait Assistance Details Returned to use of BWS system for gait training.  Pt noted to be using UE more today to lift and swing body through; continued to provide verbal cues and facilitation of lateral/anterior weight shifting and trunk extension to advance each LE; required shoe cover on LLE to improve foot clearance on L.  Required increased standing rest breaks due to UE fatigue.    Ambulation Distance (  Feet) 115 Feet   x2   Assistive device Body weight support system    Gait Pattern Step-through pattern;Step-to pattern;Trunk flexed;Poor foot clearance - left    Ambulation Surface Level;Indoor      Therapeutic Activites    Therapeutic Activities Other Therapeutic Activities    Other Therapeutic Activities Discussed options for w/c eval: [facility] vs. [facility].  Pt's mother requests to perform evaluation with current therapist to minimize number of clinicians involved in care.  PT to reach out to w/c vendors to determine if they would also be able to provide standing frame and other DME needs.                  PT Education - 07/08/20 2056    Education Details see TA    Person(s) Educated Patient    Methods Explanation    Comprehension Verbalized understanding             PT Short Term Goals - 06/26/20 1140      PT SHORT TERM GOAL #1   Title Pt will demonstrate independence with initial HEP    Time 5    Period Weeks    Status Achieved    Target Date 06/25/20      PT SHORT TERM GOAL #2   Title Pt will demonstrate ability to transfer sit > stand from mat or wheelchair with HKAFO's with min A    Time 5    Period Weeks    Status Achieved    Target Date 06/25/20      PT SHORT TERM GOAL #3   Title Pt will ambulate down and back in parallel bars x 3 trips with min A    Time 5    Period Weeks    Status Achieved    Target Date 06/25/20      PT SHORT TERM GOAL #4   Title Pt will be able to sit without back support for >10 minutes while performing dynamic activities with bilat UE with close supervision    Time 5    Period Weeks    Status Achieved    Target Date 06/25/20             PT Long Term Goals - 05/21/20 2126      PT LONG TERM GOAL #1   Title Pt will demonstrate independence with final HEP    Time 10    Period Weeks    Status New    Target Date 07/30/20      PT LONG TERM GOAL #2   Title Pt will participate in w/c and seating evaluation for more appropriate manual wheelchair    Time 10    Period Weeks    Status New    Target Date 07/30/20      PT LONG TERM GOAL #3   Title Pt will perform sit > stand with HKAFO's and LRAD with supervision    Time 10    Period Weeks    Status New    Target Date 07/30/20      PT LONG TERM GOAL #4   Title Pt will ambulate x 115' with HKAFO's and LRAD over level, indoor surfaces and min A    Time 10    Period Weeks    Status New    Target Date 07/30/20                 Plan - 07/08/20 2057    Clinical Impression Statement Returned to use of  BWS for gait training for increased support to allow pt to ambulate further distances and continue to practice weight shifting and trunk extension sequence needed for RGO.  Pt continues to experience UE fatigue and required multiple  standing rest breaks and one seated rest break.  Will continue to address and progress towards LTG.    Personal Factors and Comorbidities Comorbidity 3+;Past/Current Experience;Transportation    Comorbidities paraplegia, colostomy, spasticity, neurogenic bowel and bladder, chronic pain, pressure ulcers    Examination-Activity Limitations Locomotion Level;Stand    Examination-Participation Restrictions Community Activity;Driving    Stability/Clinical Decision Making Evolving/Moderate complexity    Rehab Potential Good    PT Frequency 2x / week    PT Duration Other (comment)   10 weeks   PT Treatment/Interventions ADLs/Self Care Home Management;DME Instruction;Gait training;Functional mobility training;Therapeutic activities;Therapeutic exercise;Balance training;Neuromuscular re-education;Patient/family education;Orthotic Fit/Training;Wheelchair mobility training    PT Next Visit Plan with bil KAFO's donned- begin to work on standing at standard walker with braces and mom support/assist to progress to standing at home with pt's walker, plus work on standing balance with decreased UE support, gait with BWS system vs with wide RW;  UE and upper back strengthening, endurance on UBE, continue to work without braces on unsupported sitting balance. add to HEP as needed.    Recommended Other Services Trey Paula coming 1/6    Consulted and Agree with Plan of Care Patient;Family member/caregiver    Family Member Consulted Mother - Delaney Meigs           Patient will benefit from skilled therapeutic intervention in order to improve the following deficits and impairments:  Abnormal gait,Decreased balance,Decreased strength,Difficulty walking,Impaired sensation,Impaired tone  Visit Diagnosis: Paraplegia, complete (HCC)  Unsteadiness on feet  Other disturbances of skin sensation  Other symptoms and signs involving the nervous system     Problem List Patient Active Problem List   Diagnosis Date Noted  .  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 Paraplegia (HCC) 05/16/2020  . Spasticity 05/16/2020  . Neurogenic bladder 05/16/2020  . Wheelchair dependence 05/16/2020   Dierdre Highman, PT, DPT 07/08/20    9:02 PM    [facility] [address] [place], [place], [address] Phone: [phone]   Fax:  [phone]  Name: Luke Velasquez MRN: [id number] Date of Birth: Dec 07, 2000

## 2020-07-10 ENCOUNTER — Other Ambulatory Visit: Payer: Self-pay

## 2020-07-10 ENCOUNTER — Ambulatory Visit: Payer: BC Managed Care – PPO | Admitting: Physical Therapy

## 2020-07-10 DIAGNOSIS — R29818 Other symptoms and signs involving the nervous system: Secondary | ICD-10-CM

## 2020-07-10 DIAGNOSIS — G8221 Paraplegia, complete: Secondary | ICD-10-CM | POA: Diagnosis not present

## 2020-07-10 DIAGNOSIS — R2681 Unsteadiness on feet: Secondary | ICD-10-CM

## 2020-07-10 DIAGNOSIS — R208 Other disturbances of skin sensation: Secondary | ICD-10-CM

## 2020-07-11 ENCOUNTER — Encounter: Payer: Self-pay | Admitting: Physical Therapy

## 2020-07-11 NOTE — Therapy (Signed)
[facility] [address] [place], [place], [address] Phone: [phone]   Fax:  [phone]  Physical Therapy Treatment  Patient Details  Name: Luke Velasquez MRN: [id number] Date of Birth: [birth year] Referring Provider (PT): Curlene Dolphin, MD (Physician in [place]; care has been transferred to Genice Rouge, MD   Encounter Date: 07/10/2020   PT End of Session - 07/11/20 1204    Visit Number 11    Number of Visits 21    Date for PT Re-Evaluation 07/30/20    Authorization Type BCBS    Authorization Time Period VL: 60 PT/OT/SLP combined    Authorization - Visit Number 11    Authorization - Number of Visits 60    PT Start Time 1445    PT Stop Time 1530    PT Time Calculation (min) 45 min    Equipment Utilized During Treatment Other (comment)   bilat KAFOs   Activity Tolerance Patient tolerated treatment well    Behavior During Therapy [facility] for tasks assessed/performed           History reviewed. No pertinent past medical history.  History reviewed. No pertinent surgical history.  There were no vitals filed for this visit.   Subjective Assessment - 07/11/20 1113    Subjective Still haing some edema in LLE.  Mother ordered compression stockings today.  Wanted to add two visits to make up for two missed.    Patient is accompained by: Family member   mom   Pertinent History paraplegia, colostomy, spasticity, neurogenic bowel and bladder, chronic pain, pressure ulcers    Patient Stated Goals To be able to walk around the house with HKAFO's.  Pt owns a standard walker.    Currently in Pain? No/denies                             [facility] Adult PT Treatment/Exercise - 07/11/20 1126      Transfers   Transfers Sit to Stand;Stand to Sit    Sit to Stand 4: Min assist    Sit to Stand Details (indicate cue type and reason) from mat with KAFO's donned, R LE extended, LLE ratcheting into extension during  sit > stand with therapist blocking L knee.      Ambulation/Gait   Ambulation/Gait Yes    Ambulation/Gait Assistance Other (comment)   +3 to manage BWS   Ambulation/Gait Assistance Details Began with harness attached to BWS system and 20-30lb lifted to assist with decreasing WB and strain on UE.  Performed first 31' with harness but pt was limited in his ability to weight shift forwards or bring trunk into extension; no significant change in strain in UE.  Removed harness with pt demonstrating improved weight shifting, trunk extension and LE advancement with therapist assisting 50% as UE fatigued.  Multiple standing rest breaks but no seated rest break today    Ambulation Distance (Feet) 230 Feet    Assistive device Body weight support system;Other (Comment)   bilat KAFO   Gait Pattern Step-through pattern;Step-to pattern;Trunk flexed;Poor foot clearance - left    Ambulation Surface Level;Indoor    Pre-Gait Activities increased difficulty donning KAFO's in shoes today due to edema.                  PT Education - 07/11/20 1203    Education Details added two more visits next week    Person(s) Educated Patient;Parent(s)    Methods  Explanation    Comprehension Verbalized understanding            PT Short Term Goals - 06/26/20 1140      PT SHORT TERM GOAL #1   Title Pt will demonstrate independence with initial HEP    Time 5    Period Weeks    Status Achieved    Target Date 06/25/20      PT SHORT TERM GOAL #2   Title Pt will demonstrate ability to transfer sit > stand from mat or wheelchair with HKAFO's with min A    Time 5    Period Weeks    Status Achieved    Target Date 06/25/20      PT SHORT TERM GOAL #3   Title Pt will ambulate down and back in parallel bars x 3 trips with min A    Time 5    Period Weeks    Status Achieved    Target Date 06/25/20      PT SHORT TERM GOAL #4   Title Pt will be able to sit without back support for >10 minutes while performing  dynamic activities with bilat UE with close supervision    Time 5    Period Weeks    Status Achieved    Target Date 06/25/20             PT Long Term Goals - 05/21/20 2126      PT LONG TERM GOAL #1   Title Pt will demonstrate independence with final HEP    Time 10    Period Weeks    Status New    Target Date 07/30/20      PT LONG TERM GOAL #2   Title Pt will participate in w/c and seating evaluation for more appropriate manual wheelchair    Time 10    Period Weeks    Status New    Target Date 07/30/20      PT LONG TERM GOAL #3   Title Pt will perform sit > stand with HKAFO's and LRAD with supervision    Time 10    Period Weeks    Status New    Target Date 07/30/20      PT LONG TERM GOAL #4   Title Pt will ambulate x 115' with HKAFO's and LRAD over level, indoor surfaces and min A    Time 10    Period Weeks    Status New    Target Date 07/30/20                 Plan - 07/11/20 1204    Clinical Impression Statement Attempted to utilize harness to reduce body weight and strain on UE.  Pt had greater difficulty with gait sequence; removed harness and pt was able to complete 2 laps today without seated rest break. Continues to require multiple standing rest breaks due to ongoing UE fatigue.  Will continue to address and progress towards LTG.    Personal Factors and Comorbidities Comorbidity 3+;Past/Current Experience;Transportation    Comorbidities paraplegia, colostomy, spasticity, neurogenic bowel and bladder, chronic pain, pressure ulcers    Examination-Activity Limitations Locomotion Level;Stand    Examination-Participation Restrictions Community Activity;Driving    Stability/Clinical Decision Making Evolving/Moderate complexity    Rehab Potential Good    PT Frequency 2x / week    PT Duration Other (comment)   10 weeks   PT Treatment/Interventions ADLs/Self Care Home Management;DME Instruction;Gait training;Functional mobility training;Therapeutic  activities;Therapeutic exercise;Balance training;Neuromuscular re-education;Patient/family education;Orthotic Fit/Training;Wheelchair  mobility training    PT Next Visit Plan Did he get compression stockings?  Endurance with UBE.  Standing balance with decreased UE support.  Gait with BWS system vs with wide RW if you have assistance;  UE and upper back strengthening on mat/quadruped/prone or standing in standing frame.    Recommended Other Services Trey Paula coming 1/6    Consulted and Agree with Plan of Care Patient;Family member/caregiver    Family Member Consulted Mother - Delaney Meigs           Patient will benefit from skilled therapeutic intervention in order to improve the following deficits and impairments:  Abnormal gait,Decreased balance,Decreased strength,Difficulty walking,Impaired sensation,Impaired tone  Visit Diagnosis: Paraplegia, complete (HCC)  Unsteadiness on feet  Other disturbances of skin sensation  Other symptoms and signs involving the nervous system     Problem List Patient Active Problem List   Diagnosis Date Noted   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 [place], [place], [address] Phone: [phone]   Fax:  [phone]  Name: Luke Velasquez MRN: [id number] Date of Birth: 2000-09-14

## 2020-07-15 ENCOUNTER — Encounter: Payer: Self-pay | Admitting: Physical Therapy

## 2020-07-15 ENCOUNTER — Ambulatory Visit: Payer: BC Managed Care – PPO | Admitting: Physical Therapy

## 2020-07-15 ENCOUNTER — Other Ambulatory Visit: Payer: Self-pay

## 2020-07-15 DIAGNOSIS — G8221 Paraplegia, complete: Secondary | ICD-10-CM | POA: Diagnosis not present

## 2020-07-15 DIAGNOSIS — R29818 Other symptoms and signs involving the nervous system: Secondary | ICD-10-CM

## 2020-07-15 DIAGNOSIS — R2681 Unsteadiness on feet: Secondary | ICD-10-CM

## 2020-07-16 NOTE — Therapy (Signed)
[facility] [address] [place], [place], [address] Phone: [phone]   Fax:  [phone]  Physical Therapy Treatment  Patient Details  Name: Luke Velasquez MRN: [id number] Date of Birth: 11-21-2000 Referring Provider (PT): Curlene Dolphin, MD (Physician in [place]; care has been transferred to Genice Rouge, MD   Encounter Date: 07/15/2020   PT End of Session - 07/15/20 1541    Visit Number 12    Number of Visits 21    Date for PT Re-Evaluation 07/30/20    Authorization Type BCBS    Authorization Time Period VL: 60 PT/OT/SLP combined    Authorization - Visit Number 12    Authorization - Number of Visits 60    PT Start Time 1534    PT Stop Time 1615    PT Time Calculation (min) 41 min    Equipment Utilized During Treatment Other (comment)   bilat KAFOs   Activity Tolerance Patient tolerated treatment well    Behavior During Therapy [facility] for tasks assessed/performed           History reviewed. No pertinent past medical history.  History reviewed. No pertinent surgical history.  There were no vitals filed for this visit.   Subjective Assessment - 07/15/20 1539    Subjective No new complaints. No falls. some back pain.    Patient is accompained by: Family member    Pertinent History paraplegia, colostomy, spasticity, neurogenic bowel and bladder, chronic pain, pressure ulcers    Patient Stated Goals To be able to walk around the house with HKAFO's.  Pt owns a standard walker.    Currently in Pain? Yes    Pain Score 5     Pain Location Back    Pain Descriptors / Indicators Aching;Dull    Pain Type Chronic pain    Pain Onset More than a month ago    Pain Frequency Constant    Aggravating Factors  increased activity    Pain Relieving Factors " nothing really"                [facility] Adult PT Treatment/Exercise - 07/15/20 1549      Transfers   Transfers Sit to Stand;Stand to Sit    Sit to Stand 4:  Min assist    Sit to Stand Details (indicate cue type and reason) from mat and then wheelchair- right LE extended out with left LE ratcheting into extension with standing to RW.    Stand to Sit 3: Mod assist    Stand to Sit Details cues to bow with one person assisting safe sitting to wheelchair and second person assisting LE's to slide out until braces could be unlocked.      Ambulation/Gait   Ambulation/Gait Yes    Ambulation/Gait Assistance 3: Mod assist;2: Max assist   2 person assist for balance/walker management   Ambulation/Gait Assistance Details use of RW today due to busy enviroment and only one other person assistance available. cues/assist needed for weight shifting, trunk extension and LE advancement. several standing rest breaks needed due to UE fatigue. assist from second person for RW management and balance.    Ambulation Distance (Feet) 70 Feet    Assistive device Rolling walker   bil KAFO   Gait Pattern Step-through pattern;Step-to pattern;Trunk flexed;Poor foot clearance - left    Ambulation Surface Level;Indoor      Therapeutic Activites    Therapeutic Activities Other Therapeutic Activities    Other Therapeutic Activities standing at RW before  gait: working on balance with decreased UE support- alternating UE raises with min to mod assist needed. then after gait had pt stand at UBE working forward direction only for 3 minutes with bil braces locked out, 2 person min assist for safety with balance.                    PT Short Term Goals - 06/26/20 1140      PT SHORT TERM GOAL #1   Title Pt will demonstrate independence with initial HEP    Time 5    Period Weeks    Status Achieved    Target Date 06/25/20      PT SHORT TERM GOAL #2   Title Pt will demonstrate ability to transfer sit > stand from mat or wheelchair with HKAFO's with min A    Time 5    Period Weeks    Status Achieved    Target Date 06/25/20      PT SHORT TERM GOAL #3   Title Pt will  ambulate down and back in parallel bars x 3 trips with min A    Time 5    Period Weeks    Status Achieved    Target Date 06/25/20      PT SHORT TERM GOAL #4   Title Pt will be able to sit without back support for >10 minutes while performing dynamic activities with bilat UE with close supervision    Time 5    Period Weeks    Status Achieved    Target Date 06/25/20             PT Long Term Goals - 05/21/20 2126      PT LONG TERM GOAL #1   Title Pt will demonstrate independence with final HEP    Time 10    Period Weeks    Status New    Target Date 07/30/20      PT LONG TERM GOAL #2   Title Pt will participate in w/c and seating evaluation for more appropriate manual wheelchair    Time 10    Period Weeks    Status New    Target Date 07/30/20      PT LONG TERM GOAL #3   Title Pt will perform sit > stand with HKAFO's and LRAD with supervision    Time 10    Period Weeks    Status New    Target Date 07/30/20      PT LONG TERM GOAL #4   Title Pt will ambulate x 115' with HKAFO's and LRAD over level, indoor surfaces and min A    Time 10    Period Weeks    Status New    Target Date 07/30/20                 Plan - 07/15/20 1541    Clinical Impression Statement Today's skilled session continued to focus on standing and gait with bil KAFO's. Increased UE faitgue with less distance with gait today due to use of RW vs body weight support machine, however the pt did state it felt better than the first time he used the RW with gait. The pt is progressing and should benefit from continued PT to progress toward unmet goals.    Personal Factors and Comorbidities Comorbidity 3+;Past/Current Experience;Transportation    Comorbidities paraplegia, colostomy, spasticity, neurogenic bowel and bladder, chronic pain, pressure ulcers    Examination-Activity Limitations Locomotion Level;Stand    Examination-Participation  Restrictions Community Activity;Driving    Stability/Clinical  Decision Making Evolving/Moderate complexity    Rehab Potential Good    PT Frequency 2x / week    PT Duration Other (comment)   10 weeks   PT Treatment/Interventions ADLs/Self Care Home Management;DME Instruction;Gait training;Functional mobility training;Therapeutic activities;Therapeutic exercise;Balance training;Neuromuscular re-education;Patient/family education;Orthotic Fit/Training;Wheelchair mobility training    PT Next Visit Plan Did he get compression stockings?  Endurance with UBE.  Standing balance with decreased UE support.  Gait with BWS system vs with wide RW if you have assistance;  UE and upper back strengthening on mat/quadruped/prone or standing in standing frame.    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

## 2020-07-17 ENCOUNTER — Other Ambulatory Visit: Payer: Self-pay

## 2020-07-17 ENCOUNTER — Encounter: Payer: Self-pay | Admitting: Physical Therapy

## 2020-07-17 ENCOUNTER — Ambulatory Visit: Payer: BC Managed Care – PPO | Admitting: Physical Therapy

## 2020-07-17 DIAGNOSIS — G8221 Paraplegia, complete: Secondary | ICD-10-CM | POA: Diagnosis not present

## 2020-07-17 DIAGNOSIS — R2681 Unsteadiness on feet: Secondary | ICD-10-CM

## 2020-07-17 DIAGNOSIS — R29818 Other symptoms and signs involving the nervous system: Secondary | ICD-10-CM

## 2020-07-18 NOTE — Therapy (Addendum)
[facility] [address] [place], [place], [address] Phone: [phone]   Fax:  [phone]  Physical Therapy Treatment  Patient Details  Name: Luke Velasquez MRN: [id number] Date of Birth: [birth year] Referring Provider (PT): Curlene Dolphin, MD (Physician in [place]; care has been transferred to Genice Rouge, MD   Encounter Date: 07/17/2020   PT End of Session - 07/17/20 1449    Visit Number 13    Number of Visits 21    Date for PT Re-Evaluation 07/30/20    Authorization Type BCBS    Authorization Time Period VL: 60 PT/OT/SLP combined    Authorization - Visit Number 13    Authorization - Number of Visits 60    PT Start Time 1448    PT Stop Time 1530    PT Time Calculation (min) 42 min    Equipment Utilized During Treatment Other (comment)   bilat KAFOs   Activity Tolerance Patient tolerated treatment well    Behavior During Therapy [facility] for tasks assessed/performed           History reviewed. No pertinent past medical history.  History reviewed. No pertinent surgical history.  There were no vitals filed for this visit.   Subjective Assessment - 07/17/20 1448    Subjective No new complaints. No falls. some back pain.    Patient is accompained by: Family member    Pertinent History paraplegia, colostomy, spasticity, neurogenic bowel and bladder, chronic pain, pressure ulcers    Patient Stated Goals To be able to walk around the house with HKAFO's.  Pt owns a standard walker.    Currently in Pain? Yes    Pain Location Back    Pain Orientation Lower;Mid    Pain Descriptors / Indicators Aching;Dull    Pain Type Chronic pain    Pain Onset More than a month ago    Pain Frequency Constant    Aggravating Factors  increased activity    Pain Relieving Factors "nothing really"             07/17/20 1449  Transfers  Transfers Sit to Stand;Stand to Sit  Sit to Stand 4: Min assist  Sit to Stand Details  (indicate cue type and reason) from wheelchair to parallel bars with right brace locked out and left KAFO ratcheting into extension/locking as pt stood up.  Stand to Sit 3: Mod assist  Stand to Sit Details pt bowing to sit into wheelchair with bil KAFO's locked out, then unlocking them once seated.  Ambulation/Gait  Ambulation/Gait Yes  Ambulation/Gait Assistance 3: Mod assist (plus chair follow)  Ambulation/Gait Assistance Details mod assist for balance/weight shifting, use of trunk movements to advance LE's.  Ambulation Distance (Feet) 10 Feet (x2)  Assistive device Parallel bars  Gait Pattern Step-through pattern;Step-to pattern;Trunk flexed;Poor foot clearance - left  Ambulation Surface Level;Indoor  Therapeutic Activites   Therapeutic Activities Other Therapeutic Activities  Other Therapeutic Activities use of UBE for strengthening/activity tolerance seated this session: level 4.1 for 3 mintues fwd, then bwd, 2 minute rest, then an additional 3 minutes forward, then backwards with goal rpm >/= 50.  Neuro Re-ed   Neuro Re-ed Details  in parallel bars: standing with bil KAFO's locked out- working on alternating UE raises while maintaining center/balance with min to mod assist. progressed to "hovering" both hands over bars while maintaining balance in the "sweet spot" with min to mod assist.         PT Short Term Goals - 06/26/20  1140      PT SHORT TERM GOAL #1   Title Pt will demonstrate independence with initial HEP    Time 5    Period Weeks    Status Achieved    Target Date 06/25/20      PT SHORT TERM GOAL #2   Title Pt will demonstrate ability to transfer sit > stand from mat or wheelchair with HKAFO's with min A    Time 5    Period Weeks    Status Achieved    Target Date 06/25/20      PT SHORT TERM GOAL #3   Title Pt will ambulate down and back in parallel bars x 3 trips with min A    Time 5    Period Weeks    Status Achieved    Target Date 06/25/20      PT SHORT  TERM GOAL #4   Title Pt will be able to sit without back support for >10 minutes while performing dynamic activities with bilat UE with close supervision    Time 5    Period Weeks    Status Achieved    Target Date 06/25/20             PT Long Term Goals - 05/21/20 2126      PT LONG TERM GOAL #1   Title Pt will demonstrate independence with final HEP    Time 10    Period Weeks    Status New    Target Date 07/30/20      PT LONG TERM GOAL #2   Title Pt will participate in w/c and seating evaluation for more appropriate manual wheelchair    Time 10    Period Weeks    Status New    Target Date 07/30/20      PT LONG TERM GOAL #3   Title Pt will perform sit > stand with HKAFO's and LRAD with supervision    Time 10    Period Weeks    Status New    Target Date 07/30/20      PT LONG TERM GOAL #4   Title Pt will ambulate x 115' with HKAFO's and LRAD over level, indoor surfaces and min A    Time 10    Period Weeks    Status New    Target Date 07/30/20             07/17/20 1449  Plan  Clinical Impression Statement Today's skilled session continued to focus on use of bil KAFO's with standing balance and short distance gait in parallel bars due to no second person assistance available this session. Ended session working on UE strengthening and endurance with UBE in sitting this session due not having a second person to assist for standing. The pt is making steady progress and should benefit from continued PT to progress toward unmet goals.  Personal Factors and Comorbidities Comorbidity 3+;Past/Current Experience;Transportation  Comorbidities paraplegia, colostomy, spasticity, neurogenic bowel and bladder, chronic pain, pressure ulcers  Examination-Activity Limitations Locomotion Level;Stand  Examination-Participation Restrictions Community Activity;Driving  Pt will benefit from skilled therapeutic intervention in order to improve on the following deficits Abnormal  gait;Decreased balance;Decreased strength;Difficulty walking;Impaired sensation;Impaired tone  Stability/Clinical Decision Making Evolving/Moderate complexity  Rehab Potential Good  PT Frequency 2x / week  PT Duration Other (comment) (10 weeks)  PT Treatment/Interventions ADLs/Self Care Home Management;DME Instruction;Gait training;Functional mobility training;Therapeutic activities;Therapeutic exercise;Balance training;Neuromuscular re-education;Patient/family education;Orthotic Fit/Training;Wheelchair mobility training  PT Next Visit Plan Did he get compression  stockings?  Endurance with UBE.  Standing balance with decreased UE support.  Gait with BWS system vs with wide RW if you have assistance;  UE and upper back strengthening on mat/quadruped/prone or standing in standing frame.  Consulted and Agree with Plan of Care Patient;Family member/caregiver  Family Member Consulted Mother - Delaney Meigs          Patient will benefit from skilled therapeutic intervention in order to improve the following deficits and impairments:  Abnormal gait,Decreased balance,Decreased strength,Difficulty walking,Impaired sensation,Impaired tone  Visit Diagnosis: Paraplegia, complete (HCC)  Unsteadiness on feet  Other symptoms and signs involving the nervous system     Problem List Patient Active Problem List   Diagnosis Date Noted  . 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 Paraplegia (HCC) 05/16/2020  . Spasticity 05/16/2020  . Neurogenic bladder 05/16/2020  . Wheelchair dependence 05/16/2020    Sallyanne Kuster, PTA, [facility] [address], [address] [place], [place] [address] [phone] 07/18/20, 5:53 PM   Name: Lanson Randle MRN: [id number] Date of Birth: 2001-01-27

## 2020-07-24 ENCOUNTER — Other Ambulatory Visit: Payer: Self-pay

## 2020-07-24 ENCOUNTER — Encounter: Payer: Self-pay | Admitting: Physical Therapy

## 2020-07-24 ENCOUNTER — Ambulatory Visit: Payer: BLUE CROSS/BLUE SHIELD | Attending: Physical Medicine and Rehabilitation | Admitting: Physical Therapy

## 2020-07-24 DIAGNOSIS — R2681 Unsteadiness on feet: Secondary | ICD-10-CM | POA: Insufficient documentation

## 2020-07-24 DIAGNOSIS — R29818 Other symptoms and signs involving the nervous system: Secondary | ICD-10-CM | POA: Diagnosis present

## 2020-07-24 DIAGNOSIS — R208 Other disturbances of skin sensation: Secondary | ICD-10-CM | POA: Insufficient documentation

## 2020-07-24 DIAGNOSIS — G8221 Paraplegia, complete: Secondary | ICD-10-CM | POA: Insufficient documentation

## 2020-07-24 NOTE — Therapy (Signed)
[facility] [address] [place], [place], [address] Phone: [phone]   Fax:  [phone]  Physical Therapy Treatment  Patient Details  Name: Luke Velasquez MRN: [id number] Date of Birth: Dec 18, 2000 Referring Provider (PT): Curlene Dolphin, MD (Physician in [place]; care has been transferred to Genice Rouge, MD   Encounter Date: 07/24/2020   PT End of Session - 07/24/20 1628    Visit Number 14    Number of Visits 21    Date for PT Re-Evaluation 07/30/20    Authorization Type BCBS    Authorization Time Period VL: 60 PT/OT/SLP combined    Authorization - Visit Number 14    Authorization - Number of Visits 60    PT Start Time 1445    PT Stop Time 1530    PT Time Calculation (min) 45 min    Equipment Utilized During Treatment Other (comment)   bilat KAFOs   Activity Tolerance Patient tolerated treatment well    Behavior During Therapy [facility] for tasks assessed/performed           History reviewed. No pertinent past medical history.  History reviewed. No pertinent surgical history.  There were no vitals filed for this visit.   Subjective Assessment - 07/24/20 1451    Subjective No new complaitns. No falls or pain (back is okay today).  Wearing new compression stockings, edema has been much better.    Patient is accompained by: Family member   mom   Pertinent History paraplegia, colostomy, spasticity, neurogenic bowel and bladder, chronic pain, pressure ulcers    Patient Stated Goals To be able to walk around the house with HKAFO's.  Pt owns a standard walker.    Currently in Pain? No/denies    Pain Score 0-No pain                             [facility] Adult PT Treatment/Exercise - 07/24/20 1617      Transfers   Transfers Sit to Stand;Stand to Sit    Sit to Stand 4: Min assist    Sit to Stand Details (indicate cue type and reason) from mat with KAFO; RLE extended and LLE ratcheting into  extension during sit > stand    Stand to Sit 3: Mod assist    Stand to Sit Details pt bowing to sit on mat with therapist assisting with elevating bilat LE      Ambulation/Gait   Ambulation/Gait Yes    Ambulation/Gait Assistance 2: Max assist    Ambulation/Gait Assistance Details performed gait around gym with RW and bilat KAFO with orthotist present to assess pt for RGO.  Therapist and then orthotist assisted behind patient providing trunk support and slight assistance with hip flexion as pt shifted weight forwards.  Required intermittent standing rest breaks to rest UE.    Ambulation Distance (Feet) 115 Feet    Assistive device Rolling walker;Other (Comment)   bilat KAFO   Gait Pattern Step-through pattern;Trunk flexed    Ambulation Surface Level;Indoor      Therapeutic Activites    Therapeutic Activities Other Therapeutic Activities    Other Therapeutic Activities following gait assessment orthotist discussed recommendations.  Orthotist recommends constructing new RGO without altering patient's current KAFO due to how well the KAFO provide stability and mobility.  Pt to continue to work with KAFO with therapy; orthotist to check with insurance for coverage and approval.  PT to obtain order for RGO.  PT Education - 07/24/20 1628    Education Details RGO recommendations    Person(s) Educated Patient;Parent(s)    Methods Explanation    Comprehension Verbalized understanding            PT Short Term Goals - 06/26/20 1140      PT SHORT TERM GOAL #1   Title Pt will demonstrate independence with initial HEP    Time 5    Period Weeks    Status Achieved    Target Date 06/25/20      PT SHORT TERM GOAL #2   Title Pt will demonstrate ability to transfer sit > stand from mat or wheelchair with HKAFO's with min A    Time 5    Period Weeks    Status Achieved    Target Date 06/25/20      PT SHORT TERM GOAL #3   Title Pt will ambulate down and back in parallel  bars x 3 trips with min A    Time 5    Period Weeks    Status Achieved    Target Date 06/25/20      PT SHORT TERM GOAL #4   Title Pt will be able to sit without back support for >10 minutes while performing dynamic activities with bilat UE with close supervision    Time 5    Period Weeks    Status Achieved    Target Date 06/25/20             PT Long Term Goals - 05/21/20 2126      PT LONG TERM GOAL #1   Title Pt will demonstrate independence with final HEP    Time 10    Period Weeks    Status New    Target Date 07/30/20      PT LONG TERM GOAL #2   Title Pt will participate in w/c and seating evaluation for more appropriate manual wheelchair    Time 10    Period Weeks    Status New    Target Date 07/30/20      PT LONG TERM GOAL #3   Title Pt will perform sit > stand with HKAFO's and LRAD with supervision    Time 10    Period Weeks    Status New    Target Date 07/30/20      PT LONG TERM GOAL #4   Title Pt will ambulate x 115' with HKAFO's and LRAD over level, indoor surfaces and min A    Time 10    Period Weeks    Status New    Target Date 07/30/20                 Plan - 07/24/20 1629    Clinical Impression Statement Following assessment of gait with KAFO, orthotist is recommending that pt obtain a new pair of RGO and keep KAFO and continue to work with therapy with KAFO to maximize independence.  Orthotist to proceed with insurance approval and therapist to obtain order from physician.  Will continue to address and progress towards LTG.    Personal Factors and Comorbidities Comorbidity 3+;Past/Current Experience;Transportation    Comorbidities paraplegia, colostomy, spasticity, neurogenic bowel and bladder, chronic pain, pressure ulcers    Examination-Activity Limitations Locomotion Level;Stand    Examination-Participation Restrictions Community Activity;Driving    Stability/Clinical Decision Making Evolving/Moderate complexity    Rehab Potential Good     PT Frequency 2x / week    PT Duration Other (comment)  10 weeks   PT Treatment/Interventions ADLs/Self Care Home Management;DME Instruction;Gait training;Functional mobility training;Therapeutic activities;Therapeutic exercise;Balance training;Neuromuscular re-education;Patient/family education;Orthotic Fit/Training;Wheelchair mobility training    PT Next Visit Plan Endurance with UBE.  Standing balance with decreased UE support.  Gait with BWS system vs with wide RW if you have assistance;  UE and upper back strengthening on mat/quadruped/prone or standing in standing frame.    Consulted and Agree with Plan of Care Patient;Family member/caregiver    Family Member Consulted Mother - Jonelle Sidle           Patient will benefit from skilled therapeutic intervention in order to improve the following deficits and impairments:  Abnormal gait,Decreased balance,Decreased strength,Difficulty walking,Impaired sensation,Impaired tone  Visit Diagnosis: Paraplegia, complete ([place])  Unsteadiness on feet  Other symptoms and signs involving the nervous system  Other disturbances of skin sensation     Problem List Patient Active Problem List   Diagnosis Date Noted  . 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 Paraplegia ([place]) 05/16/2020  . Spasticity 05/16/2020  . Neurogenic bladder 05/16/2020  . Wheelchair dependence 05/16/2020    Rico Junker, PT, DPT 07/24/20    4:32 PM    [place] [address] [place], [place], [address] Phone: [phone]   Fax:  [phone]  Name: Luke Velasquez MRN: [id number] Date of Birth: 15-Dec-2000

## 2020-07-25 ENCOUNTER — Ambulatory Visit: Payer: BLUE CROSS/BLUE SHIELD | Admitting: Physical Therapy

## 2020-07-25 DIAGNOSIS — G8221 Paraplegia, complete: Secondary | ICD-10-CM | POA: Diagnosis not present

## 2020-07-25 DIAGNOSIS — R208 Other disturbances of skin sensation: Secondary | ICD-10-CM

## 2020-07-25 DIAGNOSIS — R29818 Other symptoms and signs involving the nervous system: Secondary | ICD-10-CM

## 2020-07-25 DIAGNOSIS — R2681 Unsteadiness on feet: Secondary | ICD-10-CM

## 2020-07-27 NOTE — Therapy (Signed)
[facility] [address] [place], [place], [address] Phone: [phone]   Fax:  [phone]  Physical Therapy Treatment  Patient Details  Name: Luke Velasquez MRN: [id number] Date of Birth: 09-Jan-2001 Referring Provider (PT): Curlene Dolphin, MD (Physician in [place]; care has been transferred to Genice Rouge, MD   Encounter Date: 07/25/2020   PT End of Session - 07/27/20 1629    Visit Number 15    Number of Visits 21    Date for PT Re-Evaluation 07/30/20    Authorization Type BCBS    Authorization Time Period VL: 60 PT/OT/SLP combined    Authorization - Visit Number 15    Authorization - Number of Visits 60    PT Start Time 1458   pt arrived late   PT Stop Time 1530    PT Time Calculation (min) 32 min    Equipment Utilized During Treatment Other (comment)   bilat KAFOs   Activity Tolerance Patient tolerated treatment well    Behavior During Therapy [facility] for tasks assessed/performed           No past medical history on file.  No past surgical history on file.  There were no vitals filed for this visit.   Subjective Assessment - 07/27/20 1510    Subjective Reporting back pain today.  No soreness from walking yesterday.    Patient is accompained by: Family member   mom   Pertinent History paraplegia, colostomy, spasticity, neurogenic bowel and bladder, chronic pain, pressure ulcers    Patient Stated Goals To be able to walk around the house with KAFO's.  Pt owns a standard walker.    Currently in Pain? Yes                             [facility] Adult PT Treatment/Exercise - 07/27/20 1511      Transfers   Transfers Sit to Stand;Stand to [employer] Transfers    Sit to Stand 4: Min assist    Sit to Stand Details (indicate cue type and reason) Performed from w/c multiple times and with various combinations of one LE locked in extension and other LE ratcheting up into extension > both knees  ratcheting from flexion > extension during sit > stand.  Also attempted to perform sit > stand from w/c with one UE pushing up on counter and one pushing from w/c but unable to fully rise to standing.  Pt most successful with sit > stand with bilat LE ratcheting up.  Pt still requires min A once standing to fully extend hips and lock out knees.    Stand to Sit 3: Mod assist    Stand to Sit Details attempted to determine a way for patient to transition from standing > sitting without assistance.  Attempted to bow to sit in wheelchair with bilat LE in extension; with one UE support on RW LLE consistently caught on floor causing increased strain in the hip.  Attempted to hold RW when bowing but LLE continued to catch on floor.  Therapist attempted to unlock L KAFO prior to sitting but unable to unlock both joints without full NWB.  Pt continues to require mod A; PT to discuss with orthotist if a release latch could be added.    Stand Pivot Transfers Other (comment);1: +2 Total assist   2 helpers for safety   Stand Pivot Transfer Details (indicate cue type and reason) After standing from  w/c with KAFO and RW pt performed pivot to face counter with RW and +2 for safety; one therapist assisted with balance, other therapist assisted with RW management.  Performed stand pivot in reverse direction to return to w/c after performing standing balance at counter.    Lateral/Scoot Transfers 6: Modified independent (Device/Increase time)    Lateral/Scoot Transfer Details (indicate cue type and reason) transferred w/c > mat to don KAFO and then back to w/c with KAFO's donned    Comments W/c noted to be missing one arm rest; pt reports arm rest fell off and he only has one screw.  Pt reports he wishes both arm rests were gone and that they get in the way; pt reports he does not use arm rests to boost.  Therapist removed other arm rest.      Therapeutic Activites    Therapeutic Activities Other Therapeutic Activities     Other Therapeutic Activities Standing at counter top with bilat KAFO on; pt engaged in reaching activity beginning with use of R or LUE to open cabinets, retrieve and return itmes to cabinet.  Pt unable to maintain balance and utilize bilat UE to hold/lift items.  Therapist provided min A for trunk support                    PT Short Term Goals - 06/26/20 1140      PT SHORT TERM GOAL #1   Title Pt will demonstrate independence with initial HEP    Time 5    Period Weeks    Status Achieved    Target Date 06/25/20      PT SHORT TERM GOAL #2   Title Pt will demonstrate ability to transfer sit > stand from mat or wheelchair with HKAFO's with min A    Time 5    Period Weeks    Status Achieved    Target Date 06/25/20      PT SHORT TERM GOAL #3   Title Pt will ambulate down and back in parallel bars x 3 trips with min A    Time 5    Period Weeks    Status Achieved    Target Date 06/25/20      PT SHORT TERM GOAL #4   Title Pt will be able to sit without back support for >10 minutes while performing dynamic activities with bilat UE with close supervision    Time 5    Period Weeks    Status Achieved    Target Date 06/25/20             PT Long Term Goals - 05/21/20 2126      PT LONG TERM GOAL #1   Title Pt will demonstrate independence with final HEP    Time 10    Period Weeks    Status New    Target Date 07/30/20      PT LONG TERM GOAL #2   Title Pt will participate in w/c and seating evaluation for more appropriate manual wheelchair    Time 10    Period Weeks    Status New    Target Date 07/30/20      PT LONG TERM GOAL #3   Title Pt will perform sit > stand with HKAFO's and LRAD with supervision    Time 10    Period Weeks    Status New    Target Date 07/30/20      PT LONG TERM GOAL #4   Title  Pt will ambulate x 115' with HKAFO's and LRAD over level, indoor surfaces and min A    Time 10    Period Weeks    Status New    Target Date 07/30/20                  Plan - 07/27/20 1630    Clinical Impression Statement Continued to utilize and problem solve best method for patient to perform sit <> stand, stand pivot and static standing for greater independence with KAFO and MRADL's.  Pt will likely have greatest difficulty with stand > sit independently.  PT to continue to address and progress towards LTG.    Personal Factors and Comorbidities Comorbidity 3+;Past/Current Experience;Transportation    Comorbidities paraplegia, colostomy, spasticity, neurogenic bowel and bladder, chronic pain, pressure ulcers    Examination-Activity Limitations Locomotion Level;Stand    Examination-Participation Restrictions Community Activity;Driving    Stability/Clinical Decision Making Evolving/Moderate complexity    Rehab Potential Good    PT Frequency 2x / week    PT Duration Other (comment)   10 weeks   PT Treatment/Interventions ADLs/Self Care Home Management;DME Instruction;Gait training;Functional mobility training;Therapeutic activities;Therapeutic exercise;Balance training;Neuromuscular re-education;Patient/family education;Orthotic Fit/Training;Wheelchair mobility training    PT Next Visit Plan Check LTG; recert.  Endurance with UBE.  Standing balance with decreased UE support.  Gait with BWS system vs with wide RW if you have assistance;  UE and upper back strengthening on mat/quadruped/prone.  Problem solve ways to perform sit <> stand independently; kitchen simulations.    Recommended Other Services w/c eval    Consulted and Agree with Plan of Care Patient;Family member/caregiver    Family Member Consulted Mother - Delaney Meigs           Patient will benefit from skilled therapeutic intervention in order to improve the following deficits and impairments:  Abnormal gait,Decreased balance,Decreased strength,Difficulty walking,Impaired sensation,Impaired tone  Visit Diagnosis: Paraplegia, complete (HCC)  Unsteadiness on feet  Other symptoms and  signs involving the nervous system  Other disturbances of skin sensation     Problem List Patient Active Problem List   Diagnosis Date Noted  . 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 Paraplegia (HCC) 05/16/2020  . Spasticity 05/16/2020  . Neurogenic bladder 05/16/2020  . Wheelchair dependence 05/16/2020    Dierdre Highman, PT, DPT 07/27/20    4:34 PM    [facility] [facility] [address] [place], [place], [address] Phone: [phone]   Fax:  [phone]  Name: Luke Velasquez MRN: [id number] Date of Birth: [birth year]

## 2020-07-31 ENCOUNTER — Encounter: Payer: Self-pay | Admitting: Physical Therapy

## 2020-07-31 ENCOUNTER — Other Ambulatory Visit: Payer: Self-pay

## 2020-07-31 ENCOUNTER — Ambulatory Visit: Payer: BLUE CROSS/BLUE SHIELD | Admitting: Physical Therapy

## 2020-07-31 DIAGNOSIS — R208 Other disturbances of skin sensation: Secondary | ICD-10-CM

## 2020-07-31 DIAGNOSIS — G8221 Paraplegia, complete: Secondary | ICD-10-CM | POA: Diagnosis not present

## 2020-07-31 DIAGNOSIS — R29818 Other symptoms and signs involving the nervous system: Secondary | ICD-10-CM

## 2020-07-31 DIAGNOSIS — R2681 Unsteadiness on feet: Secondary | ICD-10-CM

## 2020-07-31 NOTE — Therapy (Signed)
[facility] [address] [place], [place], [address] Phone: [phone]   Fax:  [phone]  Physical Therapy Treatment  Patient Details  Name: Luke Velasquez MRN: [id number] Date of Birth: 04-06-2001 Referring Provider (PT): Glennie Isle, MD (Physician in [place]; care has been transferred to Courtney Heys, MD   Encounter Date: 07/31/2020   PT End of Session - 07/31/20 1613    Visit Number 16    Number of Visits 21    Date for PT Re-Evaluation 07/30/20    Authorization Type BCBS    Authorization Time Period VL: 60 PT/OT/SLP combined    Authorization - Visit Number 16    Authorization - Number of Visits 60    PT Start Time 1500    PT Stop Time 1540    PT Time Calculation (min) 40 min    Equipment Utilized During Treatment Other (comment)   bilat KAFOs   Activity Tolerance Patient tolerated treatment well    Behavior During Therapy [facility] for tasks assessed/performed           History reviewed. No pertinent past medical history.  History reviewed. No pertinent surgical history.  There were no vitals filed for this visit.   Subjective Assessment - 07/31/20 1508    Subjective "I like it better without the arm rests."    Patient is accompained by: Family member   mom   Pertinent History paraplegia, colostomy, spasticity, neurogenic bowel and bladder, chronic pain, pressure ulcers    Patient Stated Goals To be able to walk around the house with KAFO's.  Pt owns a standard walker.    Currently in Pain? No/denies              [facility] PT Assessment - 07/31/20 1601      Assessment   Medical Diagnosis C8/T1 Paraplegia    Referring Provider (PT) Glennie Isle, MD (Physician in [place]; care has been transferred to Courtney Heys, MD    Onset Date/Surgical Date 05/07/20    Prior Therapy yes      Precautions   Precautions Other (comment)    Precaution Comments paraplegia, colostomy, spasticity, neurogenic  bowel and bladder, chronic pain, pressure ulcers    Required Braces or Orthoses Other Brace/Splint    Other Brace/Splint bilat KAFO's      Prior Function   Level of Independence Independent with household mobility with device;Independent with community mobility with device;Independent with transfers    [place] Requirements working on [place] none currently                         [facility] Adult PT Treatment/Exercise - 07/31/20 1601      Transfers   Transfers Sit to Stand;Stand to Sit;Squat Pivot Transfers    Sit to Stand 4: Min guard    Sit to Stand Details (indicate cue type and reason) From elevated mat with RW and KAFOs pt able to perform sit > stand without assistance; only requires slight assistance once standing to fully extend/lock knees.  Able to stand with bilat knee joints ratcheting up into extension during sit > stand.    Stand to Sit 3: Mod assist    Stand to Sit Details still requires assistance to lift feet off floor with knees locked in extension when bowing to sit in w/c    Squat Pivot Transfers 6: Modified independent (Device/Increase time)  Ambulation/Gait   Ambulation/Gait Yes    Ambulation/Gait Assistance 3: Mod assist    Ambulation/Gait Assistance Details performed gait around gym with RW and one therapist only; no assistance with progression or stabilization of RW.  Therapist assisted at pelvis to assist with weight shifting, clearance and control of LE placement.  Pt able to advance and stabilize RW and shift weight forwards over RW with trunk flexion <> extension.  Multiple intermittent standing rest breaks to rest UE but no seated rest break.    Ambulation Distance (Feet) 115 Feet    Assistive device Rolling walker;Other (Comment)    Gait Pattern Step-through pattern;Trunk flexed;Step-to pattern;Poor foot clearance - right    Ambulation Surface Level;Indoor      Therapeutic Activites    Therapeutic Activities Other  Therapeutic Activities    Other Therapeutic Activities alerted pt that PT had contacted [facility] and would be following up with nurse after this visit to request orders for w/c eval, standing frame and RGO be sent to [facility].                  PT Education - 07/31/20 1612    Education Details plan for more visits to complete w/c evaluation and RGO assessment and will then go on hold until pt receives new equipment    Person(s) Educated Patient;Parent(s)    Methods Explanation    Comprehension Verbalized understanding            PT Short Term Goals - 06/26/20 1140      PT SHORT TERM GOAL #1   Title Pt will demonstrate independence with initial HEP    Time 5    Period Weeks    Status Achieved    Target Date 06/25/20      PT SHORT TERM GOAL #2   Title Pt will demonstrate ability to transfer sit > stand from mat or wheelchair with HKAFO's with min A    Time 5    Period Weeks    Status Achieved    Target Date 06/25/20      PT SHORT TERM GOAL #3   Title Pt will ambulate down and back in parallel bars x 3 trips with min A    Time 5    Period Weeks    Status Achieved    Target Date 06/25/20      PT SHORT TERM GOAL #4   Title Pt will be able to sit without back support for >10 minutes while performing dynamic activities with bilat UE with close supervision    Time 5    Period Weeks    Status Achieved    Target Date 06/25/20             PT Long Term Goals - 07/31/20 1614      PT LONG TERM GOAL #1   Title Pt will demonstrate independence with final HEP    Time 10    Period Weeks    Status Achieved      PT LONG TERM GOAL #2   Title Pt will participate in w/c and seating evaluation for more appropriate manual wheelchair    Time 10    Period Weeks    Status On-going      PT LONG TERM GOAL #3   Title Pt will perform sit > stand with HKAFO's and LRAD with supervision    Time 10    Period Weeks    Status Partially Met      PT LONG TERM  GOAL #4   Title Pt will  ambulate x 115' with HKAFO's and LRAD over level, indoor surfaces and min A    Time 10    Period Weeks    Status Partially Met           New goals for recertification:   PT Long Term Goals - 07/31/20 1625      PT LONG TERM GOAL #1   Title Pt will demonstrate independence with final HEP    Time 8    Period Weeks    Status Revised    Target Date 09/29/20      PT LONG TERM GOAL #2   Title Pt will participate in w/c and seating evaluation for more appropriate manual wheelchair    Time 8    Period Weeks    Status Revised    Target Date 09/29/20      PT LONG TERM GOAL #3   Title Pt will perform sit > stand with KAFO's with supervision and stand > sit with min A to unlock KAFO's    Baseline min-mod A    Time 8    Period Weeks    Status Revised    Target Date 09/29/20      PT LONG TERM GOAL #4   Title Pt will ambulate x 230' with KAFO's and LRAD over level, indoor surfaces and min A    Time 8    Period Weeks    Status Revised    Target Date 09/29/20      PT LONG TERM GOAL #5   Title Pt will participate in full assessment for fabrication of bilat RGO    Time 8    Period Weeks    Status New    Target Date 09/29/20                Plan - 07/31/20 1614    Clinical Impression Statement Pt is making steady progress towards goals.  Pt has met 1 LTG and has partially met 2 LTG; one goal ongoing.  Pt has been screened by orthotist to assess if RGO would be appropriate for patient.  Currently awaiting order from physician and then pt will participate in formal assessment for RGO.  Pt has not been evaluated for new wheelchair; awaiting order for specialty wheelchair evaluation.  Pt is independent with HEP and is actively working on UE strength.  Pt is making progress with transfers and gait with KAFO's and has progressed to ambulating with RW and one person assist but continues to require significant standing rest breaks due to UE fatigue.  Pt will benefit from continued skilled  PT services to maximize functional mobility independence with KAFO's and to participate in specialty wheelchair evaluation.  After these evaluations have been completed, plan to place pt on hold and until new equipment is received.    Personal Factors and Comorbidities Comorbidity 3+;Past/Current Experience;Transportation    Comorbidities paraplegia, colostomy, spasticity, neurogenic bowel and bladder, chronic pain, pressure ulcers    Examination-Activity Limitations Locomotion Level;Stand    Examination-Participation Restrictions Community Activity;Driving    Stability/Clinical Decision Making Evolving/Moderate complexity    Rehab Potential Good    PT Frequency 2x / week    PT Duration 8 weeks    PT Treatment/Interventions ADLs/Self Care Home Management;DME Instruction;Gait training;Functional mobility training;Therapeutic activities;Therapeutic exercise;Balance training;Neuromuscular re-education;Patient/family education;Orthotic Fit/Training;Wheelchair mobility training    PT Next Visit Plan Endurance with UBE.  Standing balance with decreased UE support.  Gait with Wide RW if  you have assistance;  UE and upper back strengthening on mat/quadruped/prone.  Problem solve ways to perform sit <> stand independently; kitchen simulations.    Consulted and Agree with Plan of Care Patient;Family member/caregiver    Family Member Consulted Mother - [name]           Patient will benefit from skilled therapeutic intervention in order to improve the following deficits and impairments:  Abnormal gait,Decreased balance,Decreased strength,Difficulty walking,Impaired sensation,Impaired tone  Visit Diagnosis: Paraplegia, complete ([place])  Unsteadiness on feet  Other disturbances of skin sensation  Other symptoms and signs involving the nervous system     Problem List Patient Active Problem List   Diagnosis Date Noted  . 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 Paraplegia ([place]) 05/16/2020  .  Spasticity 05/16/2020  . Neurogenic bladder 05/16/2020  . Wheelchair dependence 05/16/2020    Luke Velasquez, PT, DPT 07/31/20    4:24 PM    [facility] [address] [place], [place], [address] Phone: [phone]   Fax:  [phone]  Name: Luke Velasquez MRN: [id number] Date of Birth: 2000/09/21

## 2020-08-01 ENCOUNTER — Ambulatory Visit: Payer: BLUE CROSS/BLUE SHIELD | Admitting: Physical Therapy

## 2020-08-08 ENCOUNTER — Other Ambulatory Visit: Payer: Self-pay

## 2020-08-08 ENCOUNTER — Ambulatory Visit: Payer: BLUE CROSS/BLUE SHIELD | Admitting: Physical Therapy

## 2020-08-08 ENCOUNTER — Encounter: Payer: Self-pay | Admitting: Physical Therapy

## 2020-08-08 DIAGNOSIS — G8221 Paraplegia, complete: Secondary | ICD-10-CM | POA: Diagnosis not present

## 2020-08-08 DIAGNOSIS — R208 Other disturbances of skin sensation: Secondary | ICD-10-CM

## 2020-08-08 DIAGNOSIS — R29818 Other symptoms and signs involving the nervous system: Secondary | ICD-10-CM

## 2020-08-08 DIAGNOSIS — R2681 Unsteadiness on feet: Secondary | ICD-10-CM

## 2020-08-08 NOTE — Therapy (Signed)
[facility] [address] [place], [place], [address] Phone: [phone]   Fax:  [phone]  Physical Therapy Treatment  Patient Details  Name: Luke Velasquez MRN: [id number] Date of Birth: 06-Aug-2000 Referring Provider (PT): Curlene Dolphin, MD (Physician in [place]; care has been transferred to Genice Rouge, MD   Encounter Date: 08/08/2020   PT End of Session - 08/08/20 1451    Visit Number 17    Number of Visits 21    Date for PT Re-Evaluation 07/30/20    Authorization Type BCBS    Authorization Time Period VL: 60 PT/OT/SLP combined    Authorization - Visit Number 7    Authorization - Number of Visits 60    PT Start Time 1446    PT Stop Time 1532    PT Time Calculation (min) 46 min    Equipment Utilized During Treatment Other (comment)   bilat KAFOs   Activity Tolerance Patient tolerated treatment well    Behavior During Therapy [facility] for tasks assessed/performed           History reviewed. No pertinent past medical history.  History reviewed. No pertinent surgical history.  There were no vitals filed for this visit.   Subjective Assessment - 08/08/20 1449    Subjective No new complaints. No falls. "A little" pain in his back.    Patient is accompained by: Family member   mom in car/lobby   Pertinent History paraplegia, colostomy, spasticity, neurogenic bowel and bladder, chronic pain, pressure ulcers    Patient Stated Goals To be able to walk around the house with KAFO's.  Pt owns a standard walker.    Currently in Pain? Yes    Pain Location Back    Pain Orientation Mid   mid thoracic area   Pain Descriptors / Indicators Aching;Dull    Pain Type Chronic pain    Pain Onset More than a month ago    Pain Frequency Constant    Aggravating Factors  increased activity, cold weather    Pain Relieving Factors "nothing really"                 [facility] Adult PT Treatment/Exercise - 08/08/20 1500       Transfers   Transfers Sit to Stand;Stand to Sit;Squat Pivot Transfers    Sit to Stand 4: Min guard    Sit to Stand Details (indicate cue type and reason) from elevated mat table to RW x3 reps    Stand to Sit 3: Mod assist    Stand to Sit Details to mat table x3 reps with pt "bowing" to sit down while second person slides LE's out, then unlocking KAFO's once seated.    Squat Pivot Transfers 6: Modified independent (Device/Increase time)    Comments problem solving working on trying to find a way for pt to sit down without having to slide LE"s out while locked. Continued work is needed on this.      Ambulation/Gait   Ambulation/Gait Yes    Ambulation/Gait Assistance 3: Mod assist    Ambulation/Gait Assistance Details performed gait again with single person assist with RW/KAFO's on bil LE's with second person standy by for safety. assist at pelvis for stance stability, weight shifting, LE advancement and step placement. multiple short standing rest breaks needed due to UE fatigue, no seated rest break needed this session.    Ambulation Distance (Feet) 115 Feet    Assistive device Rolling walker;Other (Comment)   bil KAFO's  Gait Pattern Step-through pattern;Trunk flexed;Step-to pattern;Poor foot clearance - right    Ambulation Surface Level;Indoor      Self-Care   Self-Care Other Self-Care Comments    Other Self-Care Comments  discussed with pt options for vendors for wheelchair/standing frame. Pt reported no perference. Will let front office and primary PT know so to proceed with setting these consults up.                PT Short Term Goals - 07/31/20 1625      PT SHORT TERM GOAL #1   Title = LTG             PT Long Term Goals - 07/31/20 1625      PT LONG TERM GOAL #1   Title Pt will demonstrate independence with final HEP    Time 8    Period Weeks    Status Revised    Target Date 09/29/20      PT LONG TERM GOAL #2   Title Pt will participate in w/c and seating  evaluation for more appropriate manual wheelchair    Time 8    Period Weeks    Status Revised    Target Date 09/29/20      PT LONG TERM GOAL #3   Title Pt will perform sit > stand with KAFO's with supervision and stand > sit with min A to unlock KAFO's    Baseline min-mod A    Time 8    Period Weeks    Status Revised    Target Date 09/29/20      PT LONG TERM GOAL #4   Title Pt will ambulate x 230' with KAFO's and LRAD over level, indoor surfaces and min A    Time 8    Period Weeks    Status Revised    Target Date 09/29/20      PT LONG TERM GOAL #5   Title Pt will participate in full assessment for fabrication of bilat RGO    Time 8    Period Weeks    Status New    Target Date 09/29/20                 Plan - 08/08/20 1452    Clinical Impression Statement Today's skilled session continued to focus on use of bil KAFO's with RW with gait. Continued need for rest breaks due to UE fatigue and assist/faciliation for weight shifitng to assist with gait mechanics. The pt is progressing toward goals and should benefit from continued PT to progress toward unmet goals.    Personal Factors and Comorbidities Comorbidity 3+;Past/Current Experience;Transportation    Comorbidities paraplegia, colostomy, spasticity, neurogenic bowel and bladder, chronic pain, pressure ulcers    Examination-Activity Limitations Locomotion Level;Stand    Examination-Participation Restrictions Community Activity;Driving    Stability/Clinical Decision Making Evolving/Moderate complexity    Rehab Potential Good    PT Frequency 2x / week    PT Duration 8 weeks    PT Treatment/Interventions ADLs/Self Care Home Management;DME Instruction;Gait training;Functional mobility training;Therapeutic activities;Therapeutic exercise;Balance training;Neuromuscular re-education;Patient/family education;Orthotic Fit/Training;Wheelchair mobility training    PT Next Visit Plan Endurance with UBE.  Standing balance with  decreased UE support.  Gait with Wide RW if you have assistance;  UE and upper back strengthening on mat/quadruped/prone.  Problem solve ways to perform sit <> stand independently; kitchen simulations.    Consulted and Agree with Plan of Care Patient;Family member/caregiver    Family Member Consulted Mother - Frederik Pear  Patient will benefit from skilled therapeutic intervention in order to improve the following deficits and impairments:  Abnormal gait,Decreased balance,Decreased strength,Difficulty walking,Impaired sensation,Impaired tone  Visit Diagnosis: Unsteadiness on feet  Other disturbances of skin sensation  Other symptoms and signs involving the nervous system  Paraplegia, complete [facility])     Problem List Patient Active Problem List   Diagnosis Date Noted   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

## 2020-08-14 ENCOUNTER — Ambulatory Visit: Payer: Self-pay

## 2020-08-15 ENCOUNTER — Ambulatory Visit: Payer: BLUE CROSS/BLUE SHIELD | Admitting: Physical Therapy

## 2020-08-21 ENCOUNTER — Ambulatory Visit: Payer: BLUE CROSS/BLUE SHIELD | Attending: Physical Medicine and Rehabilitation | Admitting: Physical Therapy

## 2020-08-21 ENCOUNTER — Encounter: Payer: Self-pay | Admitting: Physical Therapy

## 2020-08-21 ENCOUNTER — Other Ambulatory Visit: Payer: Self-pay

## 2020-08-21 DIAGNOSIS — R208 Other disturbances of skin sensation: Secondary | ICD-10-CM | POA: Diagnosis present

## 2020-08-21 DIAGNOSIS — R2681 Unsteadiness on feet: Secondary | ICD-10-CM | POA: Diagnosis not present

## 2020-08-21 DIAGNOSIS — R29818 Other symptoms and signs involving the nervous system: Secondary | ICD-10-CM | POA: Diagnosis present

## 2020-08-21 DIAGNOSIS — M6281 Muscle weakness (generalized): Secondary | ICD-10-CM | POA: Diagnosis present

## 2020-08-21 DIAGNOSIS — R2689 Other abnormalities of gait and mobility: Secondary | ICD-10-CM | POA: Insufficient documentation

## 2020-08-21 DIAGNOSIS — G8221 Paraplegia, complete: Secondary | ICD-10-CM | POA: Diagnosis present

## 2020-08-21 NOTE — Therapy (Signed)
[facility] [address] [place], [place], [address] Phone: [phone]   Fax:  [phone]  Physical Therapy Treatment  Patient Details  Name: Luke Velasquez MRN: [id number] Date of Birth: March 26, 2001 Referring Provider (PT): Curlene Dolphin, MD (Physician in [place]; care has been transferred to Genice Rouge, MD   Encounter Date: 08/21/2020   PT End of Session - 08/21/20 1456    Visit Number 18    Number of Visits 21    Date for PT Re-Evaluation 07/30/20    Authorization Type BCBS    Authorization Time Period VL: 60 PT/OT/SLP combined    Authorization - Visit Number 8    Authorization - Number of Visits 60    PT Start Time 1448    PT Stop Time 1530    PT Time Calculation (min) 42 min    Equipment Utilized During Treatment --    Activity Tolerance Patient tolerated treatment well    Behavior During Therapy [facility] for tasks assessed/performed           History reviewed. No pertinent past medical history.  History reviewed. No pertinent surgical history.  There were no vitals filed for this visit.   Subjective Assessment - 08/21/20 1449    Subjective Pt has been diagnosed with new DVT in left LE. Has been seen by MD and started back on Elliquis. Mom asked MD, no restrictions with PT other than do not massage the area. Pt also UTI. On 5 day antibiotic, has 2 days left. Reporting left should pain with muscle knot he woke up with. Was able to rub the knot out him self with some improvement in pain right now, still tender/sore. No falls.    Patient is accompained by: Family member    Pertinent History paraplegia, colostomy, spasticity, neurogenic bowel and bladder, chronic pain, pressure ulcers    Patient Stated Goals To be able to walk around the house with KAFO's.  Pt owns a standard walker.    Currently in Pain? Yes    Pain Score 4     Pain Location Generalized   left shoulder/back area   Pain Orientation Left     Pain Descriptors / Indicators Aching;Sore;Tender    Pain Type Chronic pain    Pain Onset More than a month ago    Pain Frequency Constant    Aggravating Factors  increasd activity    Pain Relieving Factors massage the knot helped, rest                 [facility] Adult PT Treatment/Exercise - 08/21/20 1503      Transfers   Transfers Squat Pivot Transfers    Squat Pivot Transfers 6: Modified independent (Device/Increase time)      Therapeutic Activites    Therapeutic Activities Other Therapeutic Activities    Other Therapeutic Activities obtained calf measurments of both legs due to new DVT and pt to be casted tomorrow for RGO. left calf 31.5 cm, right calf 25.5 cm. Measurement sent to orthotist who does plan to still come to tomorrows session and will remeasure both calves and go from there. Attempted to don left KAFO  with lower leg portion being very tight on calf, therefore deferred use of KAFO's this session.      Neuro Re-ed    Neuro Re-ed Details  for strengthening/balance: in tall kneeling with UE support on Kaye bench-  mod assist for stability at pelvis while pt worked on tall posture, progressing to alternating  UE raises with rest breaks (propping on forearms) taken as neede;  Seated at edge of mat table: with inverted chair with pillows behing pt- modified curls ups for 2 sets of 10 reps with pt reaching forward toward PTA hands to assist with staying in neutral position; lateral elbow taps with emphasis on use of trunk muscles to return to sitting for 10 reps on each side with min guard assist for safety; then engaged pt in playing forward pass- started with horizontal pulls progressing to alternating diagonal pulls with emphasis on pt staying tall and in midline.                    PT Short Term Goals - 07/31/20 1625      PT SHORT TERM GOAL #1   Title = LTG             PT Long Term Goals - 07/31/20 1625      PT LONG TERM GOAL #1   Title Pt will demonstrate  independence with final HEP    Time 8    Period Weeks    Status Revised    Target Date 09/29/20      PT LONG TERM GOAL #2   Title Pt will participate in w/c and seating evaluation for more appropriate manual wheelchair    Time 8    Period Weeks    Status Revised    Target Date 09/29/20      PT LONG TERM GOAL #3   Title Pt will perform sit > stand with KAFO's with supervision and stand > sit with min A to unlock KAFO's    Baseline min-mod A    Time 8    Period Weeks    Status Revised    Target Date 09/29/20      PT LONG TERM GOAL #4   Title Pt will ambulate x 230' with KAFO's and LRAD over level, indoor surfaces and min A    Time 8    Period Weeks    Status Revised    Target Date 09/29/20      PT LONG TERM GOAL #5   Title Pt will participate in full assessment for fabrication of bilat RGO    Time 8    Period Weeks    Status New    Target Date 09/29/20                 Plan - 08/21/20 1459    Clinical Impression Statement Today's skilled session focused on core and UE strengthening with rest breaks taken as needed. No other issues noted or reported. The pt is progressing toward goals and should benefit form continued PT to progress toward unmet goals.    Personal Factors and Comorbidities Comorbidity 3+;Past/Current Experience;Transportation    Comorbidities paraplegia, colostomy, spasticity, neurogenic bowel and bladder, chronic pain, pressure ulcers    Examination-Activity Limitations Locomotion Level;Stand    Examination-Participation Restrictions Community Activity;Driving    Stability/Clinical Decision Making Evolving/Moderate complexity    Rehab Potential Good    PT Frequency 2x / week    PT Duration 8 weeks    PT Treatment/Interventions ADLs/Self Care Home Management;DME Instruction;Gait training;Functional mobility training;Therapeutic activities;Therapeutic exercise;Balance training;Neuromuscular re-education;Patient/family education;Orthotic  Fit/Training;Wheelchair mobility training    PT Next Visit Plan Endurance with UBE.  Standing balance with decreased UE support.  Gait with Wide RW if you have assistance;  UE and upper back strengthening on mat/quadruped/prone.  Problem solve ways to perform sit <> stand  independently; kitchen simulations.    Consulted and Agree with Plan of Care Patient;Family member/caregiver    Family Member Consulted Mother - [name]           Patient will benefit from skilled therapeutic intervention in order to improve the following deficits and impairments:  Abnormal gait,Decreased balance,Decreased strength,Difficulty walking,Impaired sensation,Impaired tone  Visit Diagnosis: Unsteadiness on feet  Other disturbances of skin sensation  Other symptoms and signs involving the nervous system  Paraplegia, complete [facility])     Problem List Patient Active Problem List   Diagnosis Date Noted  . 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 Paraplegia (HCC) 05/16/2020  . Spasticity 05/16/2020  . Neurogenic bladder 05/16/2020  . Wheelchair dependence 05/16/2020    Sallyanne Kuster, PTA, [facility] Outpatient Neuro [facility] [address], [address] [place], [place] [address] [phone] 08/21/20, 11:07 PM   Name: Luke Velasquez MRN: [id number] Date of Birth: 05/24/2001

## 2020-08-22 ENCOUNTER — Encounter: Payer: Self-pay | Admitting: Physical Therapy

## 2020-08-22 ENCOUNTER — Ambulatory Visit: Payer: BLUE CROSS/BLUE SHIELD | Admitting: Physical Therapy

## 2020-08-22 DIAGNOSIS — R2681 Unsteadiness on feet: Secondary | ICD-10-CM | POA: Diagnosis not present

## 2020-08-22 DIAGNOSIS — R208 Other disturbances of skin sensation: Secondary | ICD-10-CM

## 2020-08-22 DIAGNOSIS — G8221 Paraplegia, complete: Secondary | ICD-10-CM

## 2020-08-22 DIAGNOSIS — R29818 Other symptoms and signs involving the nervous system: Secondary | ICD-10-CM

## 2020-08-22 NOTE — Therapy (Signed)
[facility] [address] [place], [place], [address] Phone: [phone]   Fax:  [phone]  Physical Therapy Treatment  Patient Details  Name: Luke Velasquez MRN: [id number] Date of Birth: 2000-10-09 Referring Provider (PT): Curlene Dolphin, MD (Physician in [place]; care has been transferred to Genice Rouge, MD   Encounter Date: 08/22/2020   PT End of Session - 08/22/20 1614    Visit Number 19    Number of Visits 32    Date for PT Re-Evaluation 09/29/20    Authorization Type BCBS    Authorization Time Period VL: 60 PT/OT/SLP combined    Authorization - Visit Number 6   for the new year 2022   Authorization - Number of Visits 60    PT Start Time 1445    PT Stop Time 1530    PT Time Calculation (min) 45 min    Equipment Utilized During Treatment Other (comment)   standing frame   Activity Tolerance Patient tolerated treatment well    Behavior During Therapy [facility] for tasks assessed/performed           History reviewed. No pertinent past medical history.  History reviewed. No pertinent surgical history.  There were no vitals filed for this visit.   Subjective Assessment - 08/22/20 1451    Subjective LLE is still swollen; no pain.  Orthotist here to possibly cast LE    Patient is accompained by: Family member    Pertinent History paraplegia, colostomy, spasticity, neurogenic bowel and bladder, chronic pain, pressure ulcers    Patient Stated Goals To be able to walk around the house with KAFO's.  Pt owns a standard walker.    Currently in Pain? No/denies    Pain Onset More than a month ago              [facility] PT Assessment - 08/22/20 1455      Observation/Other Assessments-Edema    Edema Circumferential      Circumferential Edema   Circumferential - Right 25.5 cm    Circumferential - Left  32.0 cm; orthotist recommending not casting until circumference has decreased to ~28                          [facility] Adult PT Treatment/Exercise - 08/22/20 1456      Transfers   Transfers Sit to Stand;Stand to Sit    Sit to Stand 1: +1 Total assist;From bed    Sit to Stand Details (indicate cue type and reason) from mat in standing frame    Stand to Sit 1: +1 Total assist;To bed    Squat Pivot Transfers 6: Modified independent (Device/Increase time)      Static Standing Balance   Static Standing - Balance Support No upper extremity supported    Static Standing - Level of Assistance 1: +1 Total assist    Static Standing - Comment/# of Minutes Standing in standing frame while performing UE strengthening exercises.  Pt required strap behind mid and low back to provide increased stability for trunk and therapist providing support in the front to prevent leaning too far forwards against tray pad.  Stood in standing frame >20 minutes      Self-Care   Self-Care Other Self-Care Comments    Other Self-Care Comments  advised lying supine throughout the day and elevating LE above head to assist with edema management.      Exercises   Exercises Shoulder  Other Exercises  With pt standing in standing frame performed 2 sets x 12 reps: bilat rows, bilat shoulder extensions, bilat scapular depression and chest flys with resistance of black theraband.                  PT Education - 08/22/20 1604    Education Details Advised pt elevate LE a few times a day in supine above heart level; alerted pt and mom to w/c evaluation next Friday; educated mom on orthotist recommendations to cast when LLE swelling had decreased to around 28 cm.  Educated pt on set up of standing frame.  Educated pt on purpose of dry needling.    Person(s) Educated Patient;Parent(s)    Methods Explanation;Demonstration    Comprehension Verbalized understanding            PT Short Term Goals - 07/31/20 1625      PT SHORT TERM GOAL #1   Title = LTG             PT Long Term Goals - 07/31/20  1625      PT LONG TERM GOAL #1   Title Pt will demonstrate independence with final HEP    Time 8    Period Weeks    Status Revised    Target Date 09/29/20      PT LONG TERM GOAL #2   Title Pt will participate in w/c and seating evaluation for more appropriate manual wheelchair    Time 8    Period Weeks    Status Revised    Target Date 09/29/20      PT LONG TERM GOAL #3   Title Pt will perform sit > stand with KAFO's with supervision and stand > sit with min A to unlock KAFO's    Baseline min-mod A    Time 8    Period Weeks    Status Revised    Target Date 09/29/20      PT LONG TERM GOAL #4   Title Pt will ambulate x 230' with KAFO's and LRAD over level, indoor surfaces and min A    Time 8    Period Weeks    Status Revised    Target Date 09/29/20      PT LONG TERM GOAL #5   Title Pt will participate in full assessment for fabrication of bilat RGO    Time 8    Period Weeks    Status New    Target Date 09/29/20                 Plan - 08/22/20 1617    Clinical Impression Statement Due to ongoing edema in LLE, unable to cast for RGO and still unable to utilize KAFO's for gait.  Demonstrated to pt standing frame set up and use; pt able to stand >20 minutes in standing frame for postural control training and for pressure relief while performing resisted UE exercises.  Pt reporting painful trigger point in L rhomboid; may address next week with dry needling if appropriate.  Pt will participate in wheelchair evaluation and evaluation for standing frame next week.  Will continue to progress towards LTG.    Personal Factors and Comorbidities Comorbidity 3+;Past/Current Experience;Transportation    Comorbidities paraplegia, colostomy, spasticity, neurogenic bowel and bladder, chronic pain, pressure ulcers    Examination-Activity Limitations Locomotion Level;Stand    Examination-Participation Restrictions Community Activity;Driving    Stability/Clinical Decision Making  Evolving/Moderate complexity    Rehab Potential Good  PT Frequency 2x / week    PT Duration 8 weeks    PT Treatment/Interventions ADLs/Self Care Home Management;DME Instruction;Gait training;Functional mobility training;Therapeutic activities;Therapeutic exercise;Balance training;Neuromuscular re-education;Patient/family education;Orthotic Fit/Training;Wheelchair mobility training    PT Next Visit Plan Continue to measure LLE circumference mid calf - when around 28 CM let Trey Paula know so he can come do casting.  TDN for L rhomboid?  W/c and standing frame eval Friday 2/11.  Endurance with UBE.  Standing balance with decreased UE support.  Gait with Wide RW if you have assistance;  UE and upper back strengthening on mat/quadruped/prone.  Problem solve ways to perform sit <> stand independently; kitchen simulations.    Consulted and Agree with Plan of Care Patient;Family member/caregiver    Family Member Consulted Mother - [name]           Patient will benefit from skilled therapeutic intervention in order to improve the following deficits and impairments:  Abnormal gait,Decreased balance,Decreased strength,Difficulty walking,Impaired sensation,Impaired tone  Visit Diagnosis: Unsteadiness on feet  Other disturbances of skin sensation  Other symptoms and signs involving the nervous system  Paraplegia, complete [facility])     Problem List Patient Active Problem List   Diagnosis Date Noted  . 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 Paraplegia (HCC) 05/16/2020  . Spasticity 05/16/2020  . Neurogenic bladder 05/16/2020  . Wheelchair dependence 05/16/2020    Dierdre Highman, PT, DPT 08/22/20    4:22 PM    [facility] [address] [place], [place], [address] Phone: [phone]   Fax:  [phone]  Name: Luke Velasquez MRN: [id number] Date of Birth: 08-15-2000

## 2020-08-27 ENCOUNTER — Encounter (HOSPITAL_BASED_OUTPATIENT_CLINIC_OR_DEPARTMENT_OTHER): Payer: BLUE CROSS/BLUE SHIELD | Admitting: Physical Therapy

## 2020-08-28 ENCOUNTER — Ambulatory Visit: Payer: BLUE CROSS/BLUE SHIELD | Admitting: Physical Therapy

## 2020-08-29 ENCOUNTER — Ambulatory Visit: Payer: BLUE CROSS/BLUE SHIELD | Admitting: Physical Therapy

## 2020-08-29 ENCOUNTER — Encounter: Payer: BLUE CROSS/BLUE SHIELD | Admitting: Physical Medicine and Rehabilitation

## 2020-08-29 ENCOUNTER — Other Ambulatory Visit: Payer: Self-pay

## 2020-08-29 DIAGNOSIS — R29818 Other symptoms and signs involving the nervous system: Secondary | ICD-10-CM

## 2020-08-29 DIAGNOSIS — R2681 Unsteadiness on feet: Secondary | ICD-10-CM

## 2020-08-29 DIAGNOSIS — G8221 Paraplegia, complete: Secondary | ICD-10-CM

## 2020-08-29 DIAGNOSIS — R208 Other disturbances of skin sensation: Secondary | ICD-10-CM

## 2020-08-29 NOTE — Therapy (Addendum)
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 functional conditions would be the exceptions. This is to certify that the therapist has no financial relationship with durable medical provider or manufacturer. The therapist will not receive remuneration of any kind for the equipment recommended in this evaluation.   Patient has mobility limitation that significantly impairs safe, timely participation in one or more mobility related ADL's.  (bathing, toileting, feeding, dressing, grooming, moving from room to room)                                                             [x]  Yes []  No Will mobility device sufficiently improve ability to participate and/or be aided in participation of MRADL's?         [x]  Yes []  No Can limitation be compensated for with use of a cane or walker?                                                                                []  Yes [x]  No Does patient or caregiver demonstrate ability/potential ability & willingness to safely use the mobility device?   [x]  Yes []  No Does patient's home environment support use of recommended mobility device?                                                    [x]  Yes []  No Does patient have sufficient upper extremity function necessary to functionally propel a manual wheelchair?    [x]  Yes []  No Does patient have sufficient strength and trunk stability to safely operate a POV (scooter)?                                  []  Yes []   No Does patient need additional features/benefits provided by a power  wheelchair for MRADL's in the home?       []  Yes []  No Does the patient demonstrate the ability to safely use a power wheelchair?                                                              []  Yes []  No  Therapist Name Printed: Tilda Burrow. Melrose Nakayama, PT, DPT Date: 2.11.2022  Therapist's Signature:   Date: 2.11.2022  Supplier's Name Printed: [employer] Date: 2.11.2022  Supplier's Signature:   Date:  Patient/Caregiver Signature:   Date:     This is to certify that I have read this evaluation and do agree with the content within:      Physician's Name Printed: Zadie Cleverly, MD   77 Signature:  Date:     This is to certify that I, the above signed therapist have the following affiliations: []  This DME provider []  Manufacturer of recommended equipment []  Patient's long term care facility [x]  None of the above        PT Education - 08/29/20 1631    Education Details process for obtaining new manual wheelchair, wheelchair recommendations, insurance coverage for standing frames and will need LMN    Person(s) Educated Patient;Parent(s)    Methods Explanation    Comprehension Verbalized understanding            PT Short Term Goals - 07/31/20 1625      PT SHORT TERM GOAL #1   Title = LTG             PT Long Term Goals - 07/31/20 1625      PT LONG TERM GOAL #1   Title Pt will demonstrate independence with final HEP    Time 8    Period Weeks    Status Revised    Target Date 09/29/20      PT LONG TERM GOAL #2   Title Pt will participate in w/c and seating evaluation for more appropriate manual wheelchair    Time 8    Period Weeks    Status Revised    Target Date 09/29/20      PT LONG TERM GOAL #3   Title Pt will perform sit > stand with KAFO's with supervision and stand > sit with min A to unlock KAFO's    Baseline min-mod A    Time 8    Period Weeks    Status Revised     Target Date 09/29/20      PT LONG TERM GOAL #4   Title Pt will ambulate x 230' with KAFO's and LRAD over level, indoor surfaces and min A    Time 8    Period Weeks    Status Revised    Target Date 09/29/20      PT LONG TERM GOAL #5   Title Pt will participate in full assessment for fabrication of bilat RGO    Time 8    Period Weeks    Status New    Target Date 09/29/20                 Plan - 08/29/20 1633    Clinical Impression Statement LLE edema has decreased by 2 cm; still not enough to perform LE casting.  Pt participated in  assessment for manual wheelchair.  Recommending rigid frame with quick release wheels, positioning and skin protection cushion and low contour back for more efficient manual propulsion and independence with break down and set up.  Please see LMN for further details.    Personal Factors and Comorbidities Comorbidity 3+;Past/Current Experience;Transportation    Comorbidities paraplegia, colostomy, spasticity, neurogenic bowel and bladder, chronic pain, pressure ulcers    Examination-Activity Limitations Locomotion Level;Stand    Examination-Participation Restrictions Community Activity;Driving    Stability/Clinical Decision Making Evolving/Moderate complexity    Rehab Potential Good    PT Frequency 2x / week    PT Duration 8 weeks    PT Treatment/Interventions ADLs/Self Care Home Management;DME Instruction;Gait training;Functional mobility training;Therapeutic activities;Therapeutic exercise;Balance training;Neuromuscular re-education;Patient/family education;Orthotic Fit/Training;Wheelchair mobility training    PT Next Visit Plan Continue to measure LLE circumference mid calf - when around 28 CM let Merry Proud know so he can come do casting.  TDN for L rhomboid?  Endurance with UBE.  Standing balance with decreased UE support.  Gait with Wide RW if you have assistance;  UE and upper back strengthening on mat/quadruped/prone.  Problem solve ways to perform sit  <> stand independently; kitchen simulations.    Consulted and Agree with Plan of Care Patient;Family member/caregiver    Family Member Consulted Mother - [name]           Patient will benefit from skilled therapeutic intervention in order to improve the following deficits and impairments:  Abnormal gait,Decreased balance,Decreased strength,Difficulty walking,Impaired sensation,Impaired tone  Visit Diagnosis: Unsteadiness on feet  Other disturbances of skin sensation  Other symptoms and signs involving the nervous system  Paraplegia, complete [facility])     Problem List Patient Active Problem List   Diagnosis Date Noted  . 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 Paraplegia ([place]) 05/16/2020  . Spasticity 05/16/2020  . Neurogenic bladder 05/16/2020  . Wheelchair dependence 05/16/2020    Rico Junker, PT, DPT 08/29/20    4:40 PM    [place] [address] [place], [place], [address] Phone: [phone]   Fax:  [phone]  Name: Lenville Hibberd MRN: [id number] Date of Birth: 2001/03/24

## 2020-09-04 ENCOUNTER — Ambulatory Visit: Payer: BLUE CROSS/BLUE SHIELD

## 2020-09-04 ENCOUNTER — Other Ambulatory Visit: Payer: Self-pay

## 2020-09-04 DIAGNOSIS — M6281 Muscle weakness (generalized): Secondary | ICD-10-CM

## 2020-09-04 DIAGNOSIS — G8221 Paraplegia, complete: Secondary | ICD-10-CM

## 2020-09-04 DIAGNOSIS — R2681 Unsteadiness on feet: Secondary | ICD-10-CM | POA: Diagnosis not present

## 2020-09-05 ENCOUNTER — Ambulatory Visit: Payer: BLUE CROSS/BLUE SHIELD

## 2020-09-05 NOTE — Therapy (Signed)
[facility] [address] [place], [place], [address] Phone: [phone]   Fax:  [phone]  Physical Therapy Treatment  Patient Details  Name: Luke Velasquez MRN: [id number] Date of Birth: [birth year] Referring Provider (PT): Curlene Dolphin, MD (Physician in [place]; care has been transferred to Genice Rouge, MD   Encounter Date: 09/04/2020   PT End of Session - 09/04/20 1445    Visit Number 21    Number of Visits 32    Date for PT Re-Evaluation 09/29/20    Authorization Type BCBS    Authorization Time Period VL: 60 PT/OT/SLP combined    Authorization - Visit Number 8   for the new year 2022   Authorization - Number of Visits 60    PT Start Time 1444    PT Stop Time 1529    PT Time Calculation (min) 45 min    Activity Tolerance Patient tolerated treatment well    Behavior During Therapy [facility] for tasks assessed/performed           History reviewed. No pertinent past medical history.  History reviewed. No pertinent surgical history.  There were no vitals filed for this visit.   Subjective Assessment - 09/04/20 1446    Subjective Pt reports that left leg is doing better.    Patient is accompained by: Family member    Pertinent History paraplegia, colostomy, spasticity, neurogenic bowel and bladder, chronic pain, pressure ulcers    Patient Stated Goals To be able to walk around the house with KAFO's.  Pt owns a standard walker.    Currently in Pain? Yes    Pain Score 5     Pain Location Back    Pain Orientation Lower    Pain Descriptors / Indicators Aching;Sore    Pain Type Chronic pain    Pain Onset More than a month ago    Pain Frequency Constant              [facility] PT Assessment - 09/04/20 1446      Observation/Other Assessments-Edema    Edema Circumferential   at mid calf     Circumferential Edema   Circumferential - Right 25 cm    Circumferential - Left  29.5 cm                          [facility] Adult PT Treatment/Exercise - 09/04/20 1446      Transfers   Transfers Lateral/Scoot Transfers    Squat Pivot Transfers 6: Modified independent (Device/Increase time)      Neuro Re-ed    Neuro Re-ed Details  In long sit: reaching across body for 6 cones x 2 each side. Pt with more trunk rotation to the right. Repeated activity on short sit at edge of mat x 6 each side. Pt reported some cramping feeling in right pec area with reaching across to left at end range.      Exercises   Exercises Other Exercises    Other Exercises  Prone on mat: scapular rows over edge 10 x 2 with 5# bilateral then x 10 with 8# bilateral, Unilateral Y for lower trap with 5# x 10 on right and 8# x 10 on left, scooting back so chest fully supported on mat unilateral Y for lower trap at end range x 10 each side with tactile cues at scapular for form. More difficult to raise on right. In quadruped over peanut ball: PT positioned knees under  pelvis to get some weight through legs and stabilized at pelvis and performed the following: alternating shoulder flexion x 10, reach across body and moving 5# weight from one side to the other x 10 then repeated with 8# weight, tossing bean bags in to basket 15 x 2 alternating arms, tossing 2.2# med ball with rehab tech 5 x 2 each arm.      Manual Therapy   Manual therapy comments Trigger point release to left rhomboids 30 sec x 3. Then instructed pt in use of theracane to try to perform self trigger point release. Instructed to try to relax shoulders when performed and apply firm pressure that was tolerable through cane and hold about 30 sec. Pt does have significant trigger points on left rhomboids.                  PT Education - 09/05/20 0905    Education Details PT educated pt on theracane for trigger point release if he should want to obtain one.    Person(s) Educated Patient    Methods Explanation;Demonstration    Comprehension  Verbalized understanding;Returned demonstration            PT Short Term Goals - 07/31/20 1625      PT SHORT TERM GOAL #1   Title = LTG             PT Long Term Goals - 07/31/20 1625      PT LONG TERM GOAL #1   Title Pt will demonstrate independence with final HEP    Time 8    Period Weeks    Status Revised    Target Date 09/29/20      PT LONG TERM GOAL #2   Title Pt will participate in w/c and seating evaluation for more appropriate manual wheelchair    Time 8    Period Weeks    Status Revised    Target Date 09/29/20      PT LONG TERM GOAL #3   Title Pt will perform sit > stand with KAFO's with supervision and stand > sit with min A to unlock KAFO's    Baseline min-mod A    Time 8    Period Weeks    Status Revised    Target Date 09/29/20      PT LONG TERM GOAL #4   Title Pt will ambulate x 230' with KAFO's and LRAD over level, indoor surfaces and min A    Time 8    Period Weeks    Status Revised    Target Date 09/29/20      PT LONG TERM GOAL #5   Title Pt will participate in full assessment for fabrication of bilat RGO    Time 8    Period Weeks    Status New    Target Date 09/29/20                 Plan - 09/05/20 0907    Clinical Impression Statement Pt continued to have swelling in LLE but down to 29.5 cm today. Will continue to monitor. Focused on back/posterior shoulder strengthening in prone and quadruped more today. PT had to stabilize at pelvis. Pt denied any pain in low back with activities.    Personal Factors and Comorbidities Comorbidity 3+;Past/Current Experience;Transportation    Comorbidities paraplegia, colostomy, spasticity, neurogenic bowel and bladder, chronic pain, pressure ulcers    Examination-Activity Limitations Locomotion Level;Stand    Examination-Participation Restrictions Community Activity;Driving    Stability/Clinical  Decision Making Evolving/Moderate complexity    Rehab Potential Good    PT Frequency 2x / week    PT  Duration 8 weeks    PT Treatment/Interventions ADLs/Self Care Home Management;DME Instruction;Gait training;Functional mobility training;Therapeutic activities;Therapeutic exercise;Balance training;Neuromuscular re-education;Patient/family education;Orthotic Fit/Training;Wheelchair mobility training    PT Next Visit Plan What is recert plan? Check with primary PT, Audra. Continue to measure LLE circumference mid calf - when around 28 CM let Trey Paula know so he can come do casting.  TDN for L rhomboid? Or continue with manual techniques with trigger point release. Endurance with UBE.  Standing balance with decreased UE support.  Gait with Wide RW if you have assistance;  UE and upper back strengthening on mat/quadruped/prone.  Problem solve ways to perform sit <> stand independently; kitchen simulations.    Consulted and Agree with Plan of Care Patient;Family member/caregiver    Family Member Consulted Mother - [name]           Patient will benefit from skilled therapeutic intervention in order to improve the following deficits and impairments:  Abnormal gait,Decreased balance,Decreased strength,Difficulty walking,Impaired sensation,Impaired tone  Visit Diagnosis: Muscle weakness (generalized)  Paraplegia, complete [facility])     Problem List Patient Active Problem List   Diagnosis Date Noted  . 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 Paraplegia ([facility]) 05/16/2020  . Spasticity 05/16/2020  . Neurogenic bladder 05/16/2020  . Wheelchair dependence 05/16/2020    Ronn Melena, PT, DPT, NCS 09/05/2020, 9:11 AM  [facility] [address] [place], [place], [address] Phone: [phone]   Fax:  [phone]  Name: Daquan Crapps MRN: [id number] Date of Birth: [birth year]

## 2020-09-11 ENCOUNTER — Ambulatory Visit: Payer: Self-pay | Admitting: Physical Therapy

## 2020-09-12 ENCOUNTER — Ambulatory Visit: Payer: BLUE CROSS/BLUE SHIELD

## 2020-09-12 ENCOUNTER — Other Ambulatory Visit: Payer: Self-pay

## 2020-09-12 DIAGNOSIS — R2689 Other abnormalities of gait and mobility: Secondary | ICD-10-CM

## 2020-09-12 DIAGNOSIS — M6281 Muscle weakness (generalized): Secondary | ICD-10-CM

## 2020-09-12 DIAGNOSIS — R2681 Unsteadiness on feet: Secondary | ICD-10-CM | POA: Diagnosis not present

## 2020-09-12 DIAGNOSIS — G8221 Paraplegia, complete: Secondary | ICD-10-CM

## 2020-09-12 NOTE — Therapy (Signed)
[facility] [address] [place], [place], [address] Phone: [phone]   Fax:  [phone]  Physical Therapy Treatment  Patient Details  Name: Luke Velasquez MRN: [id number] Date of Birth: 12-29-2000 Referring Provider (PT): Curlene Dolphin, MD (Physician in [place]; care has been transferred to Genice Rouge, MD   Encounter Date: 09/12/2020   PT End of Session - 09/12/20 1535    Visit Number 22    Number of Visits 32    Date for PT Re-Evaluation 09/29/20    Authorization Type BCBS    Authorization Time Period VL: 60 PT/OT/SLP combined    Authorization - Visit Number 9   for the new year 2022   Authorization - Number of Visits 60    PT Start Time 1534    PT Stop Time 1617    PT Time Calculation (min) 43 min    Activity Tolerance Patient tolerated treatment well    Behavior During Therapy [facility] for tasks assessed/performed           History reviewed. No pertinent past medical history.  History reviewed. No pertinent surgical history.  There were no vitals filed for this visit.   Subjective Assessment - 09/12/20 1535    Subjective Pt reports leg seems to be doing better.    Patient is accompained by: Family member    Pertinent History paraplegia, colostomy, spasticity, neurogenic bowel and bladder, chronic pain, pressure ulcers    Patient Stated Goals To be able to walk around the house with KAFO's.  Pt owns a standard walker.    Currently in Pain? Yes    Pain Score 4     Pain Location Back    Pain Orientation Lower    Pain Descriptors / Indicators Aching    Pain Type Chronic pain    Pain Onset More than a month ago    Pain Frequency Constant              [facility] PT Assessment - 09/12/20 1536      Observation/Other Assessments-Edema    Edema Circumferential      Circumferential Edema   Circumferential - Right 24 cm    Circumferential - Left  27.5cm                         [facility]  Adult PT Treatment/Exercise - 09/12/20 1536      Transfers   Transfers Lateral/Scoot Transfers;Stand to Sit    Sit to Stand 4: Min guard    Sit to Stand Details (indicate cue type and reason) from mat pushing through UE on walker. PT assisted to fully extend KAFO once up.    Stand to Sit 3: Mod assist    Stand to Sit Details Pt bows foward and 2nd person assist to slide feet out as KAFOs remain locked going to sit.    Lateral/Scoot Transfers 6: Modified independent (Device/Increase time)      Ambulation/Gait   Ambulation/Gait Yes    Ambulation/Gait Assistance 3: Mod assist    Ambulation/Gait Assistance Details performed gait again with single person assist with bariatric RW/KAFO's on bil LE's with second person standy by for safety at walker. PT assisted at pelvis for stance stability, weight shifting, LE advancement and step placement. multiple short standing rest breaks needed due to UE fatigue, no seated rest break needed this session. Pt was able to use trunk more to help with advancement first half.    Ambulation  Distance (Feet) 115 Feet    Assistive device Rolling walker   bariatric for wider width with bilateral KAFOs   Gait Pattern Step-through pattern;Trunk flexed;Step-to pattern;Poor foot clearance - right                  PT Education - 09/12/20 1848    Education Details Pt notified that therapist would be contacting orthotist to hopefully come next week for casting as edema has come down enough in left leg.    Person(s) Educated Patient    Methods Explanation    Comprehension Verbalized understanding            PT Short Term Goals - 07/31/20 1625      PT SHORT TERM GOAL #1   Title = LTG             PT Long Term Goals - 07/31/20 1625      PT LONG TERM GOAL #1   Title Pt will demonstrate independence with final HEP    Time 8    Period Weeks    Status Revised    Target Date 09/29/20      PT LONG TERM GOAL #2   Title Pt will participate in w/c and  seating evaluation for more appropriate manual wheelchair    Time 8    Period Weeks    Status Revised    Target Date 09/29/20      PT LONG TERM GOAL #3   Title Pt will perform sit > stand with KAFO's with supervision and stand > sit with min A to unlock KAFO's    Baseline min-mod A    Time 8    Period Weeks    Status Revised    Target Date 09/29/20      PT LONG TERM GOAL #4   Title Pt will ambulate x 230' with KAFO's and LRAD over level, indoor surfaces and min A    Time 8    Period Weeks    Status Revised    Target Date 09/29/20      PT LONG TERM GOAL #5   Title Pt will participate in full assessment for fabrication of bilat RGO    Time 8    Period Weeks    Status New    Target Date 09/29/20                 Plan - 09/12/20 1622    Clinical Impression Statement Pt's swelling was down to 27.5 cm in LLE so PT will let orthotist know for casting for RGOs. Pt was able to donn KAFOs again today so continued with gait training. Pt showing better trunk lean ant/post to help with advancement of legs initially but became more challenging as left arm fatigued.    Personal Factors and Comorbidities Comorbidity 3+;Past/Current Experience;Transportation    Comorbidities paraplegia, colostomy, spasticity, neurogenic bowel and bladder, chronic pain, pressure ulcers    Examination-Activity Limitations Locomotion Level;Stand    Examination-Participation Restrictions Community Activity;Driving    Stability/Clinical Decision Making Evolving/Moderate complexity    Rehab Potential Good    PT Frequency 2x / week    PT Duration 8 weeks    PT Treatment/Interventions ADLs/Self Care Home Management;DME Instruction;Gait training;Functional mobility training;Therapeutic activities;Therapeutic exercise;Balance training;Neuromuscular re-education;Patient/family education;Orthotic Fit/Training;Wheelchair mobility training    PT Next Visit Plan Check pelvis with wedge to see if changes under right if  flexibile or not due to scoliosis as trying to decide if needs cushion with wedge capability or  not. Pt took KAFOs home with him as wants to try with mom at home. Standing balance with decreased UE support.  Gait with Wide RW if you have assistance;  UE and upper back strengthening on mat/quadruped/prone. Will most likly put on hold/discharge next week to conserve visits until RGO received pending how long that will take.    Consulted and Agree with Plan of Care Patient;Family member/caregiver    Family Member Consulted Mother - [name]           Patient will benefit from skilled therapeutic intervention in order to improve the following deficits and impairments:  Abnormal gait,Decreased balance,Decreased strength,Difficulty walking,Impaired sensation,Impaired tone  Visit Diagnosis: Other abnormalities of gait and mobility  Muscle weakness (generalized)  Paraplegia, complete [facility])     Problem List Patient Active Problem List   Diagnosis Date Noted  . 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 Paraplegia ([facility]) 05/16/2020  . Spasticity 05/16/2020  . Neurogenic bladder 05/16/2020  . Wheelchair dependence 05/16/2020    Ronn Melena, PT, DPT, NCS 09/12/2020, 6:55 PM  [facility] [address] [place], [place], [address] Phone: [phone]   Fax:  [phone]  Name: Mackinley Kiehn MRN: [id number] Date of Birth: 01-Dec-2000

## 2020-09-18 ENCOUNTER — Ambulatory Visit: Payer: BLUE CROSS/BLUE SHIELD | Attending: Physical Medicine and Rehabilitation | Admitting: Physical Therapy

## 2020-09-18 ENCOUNTER — Encounter: Payer: Self-pay | Admitting: Physical Therapy

## 2020-09-18 ENCOUNTER — Other Ambulatory Visit: Payer: Self-pay

## 2020-09-18 DIAGNOSIS — M6281 Muscle weakness (generalized): Secondary | ICD-10-CM | POA: Diagnosis present

## 2020-09-18 DIAGNOSIS — R2689 Other abnormalities of gait and mobility: Secondary | ICD-10-CM | POA: Insufficient documentation

## 2020-09-18 DIAGNOSIS — G8221 Paraplegia, complete: Secondary | ICD-10-CM | POA: Diagnosis present

## 2020-09-18 DIAGNOSIS — R2681 Unsteadiness on feet: Secondary | ICD-10-CM | POA: Insufficient documentation

## 2020-09-18 NOTE — Therapy (Signed)
[facility] [address] [place], [place], [address] Phone: [phone]   Fax:  [phone]  Physical Therapy Treatment  Patient Details  Name: Luke Velasquez MRN: [id number] Date of Birth: [birth year] Referring Provider (PT): Curlene Dolphin, MD (Physician in [place]; care has been transferred to Genice Rouge, MD   Encounter Date: 09/18/2020   PT End of Session - 09/18/20 1548    Visit Number 23    Number of Visits 32    Date for PT Re-Evaluation 09/29/20    Authorization Type BCBS    Authorization Time Period VL: 60 PT/OT/SLP combined    Authorization - Visit Number 10   for the new year 2022   Authorization - Number of Visits 60    PT Start Time 1545   pt late for appt today   PT Stop Time 1608   ended early due to orthotist here for casting of RGO brace   PT Time Calculation (min) 23 min    Activity Tolerance Patient tolerated treatment well    Behavior During Therapy [facility] for tasks assessed/performed           History reviewed. No pertinent past medical history.  History reviewed. No pertinent surgical history.  There were no vitals filed for this visit.   Subjective Assessment - 09/18/20 1546    Subjective No new complaints. Stood once at the walker at home with KAFO's. Had trouble sitting down as he was by himself. Was able to get one leg forward good, the other one he had to keep widggling until it shot out.    Patient is accompained by: Family member    Pertinent History paraplegia, colostomy, spasticity, neurogenic bowel and bladder, chronic pain, pressure ulcers    Patient Stated Goals To be able to walk around the house with KAFO's.  Pt owns a standard walker.    Currently in Pain? No/denies    Pain Score 0-No pain                 [facility] Adult PT Treatment/Exercise - 09/18/20 1555      Transfers   Transfers Lateral/Scoot Transfers    Lateral/Scoot Transfers 6: Modified independent  (Device/Increase time)      Therapeutic Activites    Therapeutic Activities Other Therapeutic Activities    Other Therapeutic Activities worked at edge of mat with use of wedges due to pt with increased pressure on left buttock from fixed scoliosis. with wedge on left side pt pushed down into wedge more with no correction of posture. on right pt was tipped further toward left with increased scoliotic curve      Neuro Re-ed    Neuro Re-ed Details  for strengthening/muscler re-ed: with 2# weighted ball modified curl ups for 10 reps with emphasis on midline and slow, controlled movements.                  PT Short Term Goals - 07/31/20 1625      PT SHORT TERM GOAL #1   Title = LTG             PT Long Term Goals - 07/31/20 1625      PT LONG TERM GOAL #1   Title Pt will demonstrate independence with final HEP    Time 8    Period Weeks    Status Revised    Target Date 09/29/20      PT LONG TERM GOAL #2   Title Pt will  participate in w/c and seating evaluation for more appropriate manual wheelchair    Time 8    Period Weeks    Status Revised    Target Date 09/29/20      PT LONG TERM GOAL #3   Title Pt will perform sit > stand with KAFO's with supervision and stand > sit with min A to unlock KAFO's    Baseline min-mod A    Time 8    Period Weeks    Status Revised    Target Date 09/29/20      PT LONG TERM GOAL #4   Title Pt will ambulate x 230' with KAFO's and LRAD over level, indoor surfaces and min A    Time 8    Period Weeks    Status Revised    Target Date 09/29/20      PT LONG TERM GOAL #5   Title Pt will participate in full assessment for fabrication of bilat RGO    Time 8    Period Weeks    Status New    Target Date 09/29/20                 Plan - 09/18/20 1548    Clinical Impression Statement Today's skilled session initially fcused on use of wedges for seating with new cushion in attempt to offload pressure on left side. All wedges  excerbated the issues. Discussed with primary PT who will notify wheelchair rep. Worked on core strengthening with remainder of session. Session ended early due to arrival of orthotist for casting of pt's new RGO. The pt is progressing toward goals and should benefit from continued PT to progress toward unmet goals.    Personal Factors and Comorbidities Comorbidity 3+;Past/Current Experience;Transportation    Comorbidities paraplegia, colostomy, spasticity, neurogenic bowel and bladder, chronic pain, pressure ulcers    Examination-Activity Limitations Locomotion Level;Stand    Examination-Participation Restrictions Community Activity;Driving    Stability/Clinical Decision Making Evolving/Moderate complexity    Rehab Potential Good    PT Frequency 2x / week    PT Duration 8 weeks    PT Treatment/Interventions ADLs/Self Care Home Management;DME Instruction;Gait training;Functional mobility training;Therapeutic activities;Therapeutic exercise;Balance training;Neuromuscular re-education;Patient/family education;Orthotic Fit/Training;Wheelchair mobility training    PT Next Visit Plan gait with KAFO's. Pt going on hold after next session until he gets his RGO (~6 weeks).    Consulted and Agree with Plan of Care Patient;Family member/caregiver    Family Member Consulted Mother - [name]           Patient will benefit from skilled therapeutic intervention in order to improve the following deficits and impairments:  Abnormal gait,Decreased balance,Decreased strength,Difficulty walking,Impaired sensation,Impaired tone  Visit Diagnosis: Other abnormalities of gait and mobility  Muscle weakness (generalized)  Paraplegia, complete (HCC)  Unsteadiness on feet     Problem List Patient Active Problem List   Diagnosis Date Noted  . 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 Paraplegia (HCC) 05/16/2020  . Spasticity 05/16/2020  . Neurogenic bladder 05/16/2020  . Wheelchair dependence 05/16/2020     Sallyanne Kuster, PTA, [facility] Outpatient Neuro [facility] [address] [place], [place] [address] [phone] 09/19/20, 8:41 AM   Name: Luke Velasquez MRN: [id number] Date of Birth: [birth year]

## 2020-09-19 ENCOUNTER — Ambulatory Visit: Payer: BLUE CROSS/BLUE SHIELD

## 2020-09-19 DIAGNOSIS — G8221 Paraplegia, complete: Secondary | ICD-10-CM

## 2020-09-19 DIAGNOSIS — R2689 Other abnormalities of gait and mobility: Secondary | ICD-10-CM | POA: Diagnosis not present

## 2020-09-19 DIAGNOSIS — M6281 Muscle weakness (generalized): Secondary | ICD-10-CM

## 2020-09-19 NOTE — Therapy (Signed)
[facility] [address] [place], [place], [address] Phone: [phone]   Fax:  [phone]  Physical Therapy Treatment  Patient Details  Name: Luke Velasquez MRN: [id number] Date of Birth: Jan 03, 2001 Referring Provider (PT): Glennie [place], MD (Physician in [place]; care has been transferred to Courtney Heys, MD   Encounter Date: 09/19/2020   PT End of Session - 09/19/20 1545    Visit Number 24    Number of Visits 32    Date for PT Re-Evaluation 09/29/20    Authorization Type BCBS    Authorization Time Period VL: 60 PT/OT/SLP combined    Authorization - Visit Number 11   for the new year 2022   Authorization - Number of Visits 60    PT Start Time 1157   pt arrived late   PT Stop Time 1609    PT Time Calculation (min) 27 min    Activity Tolerance Patient tolerated treatment well    Behavior During Therapy [facility] for tasks assessed/performed           History reviewed. No pertinent past medical history.  History reviewed. No pertinent surgical history.  There were no vitals filed for this visit.   Subjective Assessment - 09/19/20 1546    Subjective Pt did not bring his KAFOs with him. Reports that orthotist said it would be 4-6 weeks to get RGOs.    Patient is accompained by: Family member    Pertinent History paraplegia, colostomy, spasticity, neurogenic bowel and bladder, chronic pain, pressure ulcers    Patient Stated Goals To be able to walk around the house with KAFO's.  Pt owns a standard walker.    Currently in Pain? Yes    Pain Score 5     Pain Location Back    Pain Orientation Mid;Lower    Pain Descriptors / Indicators Aching    Pain Onset More than a month ago    Pain Frequency Intermittent    Aggravating Factors  sitting longer    Pain Relieving Factors pressure relief             Exercises Long Sitting Tricep Dip - 1 x daily - 7 x weekly - 2 sets - 10 reps  -performed x 10 Seated  Shoulder Row with Anchored Resistance - 1 x daily - 7 x weekly - 2 sets - 12 reps  -performed x 10 with instruction to anchor black theraband in doorjam Alternating Shoulder Press on [employer] - 1 x daily - 7 x weekly - 2 sets - 10 reps  -discussed continuing to perform Seated Shoulder External Rotation with Resistance - 1 x daily - 7 x weekly - 2 sets - 10 reps  -performed x 10 with black theraband anchored in doorjam Seated Shoulder Internal Rotation with Anchored Resistance - 1 x daily - 7 x weekly - 2 sets - 10 reps  -performed x 10 with black theraband anchored in doorjam Seated Single Arm Bicep Curls Supinated with Dumbbell - 1 x daily - 7 x weekly - 2 sets - 12 reps  -discussed continuing to perform PT also demonstrated lat pull down with theraband and pt performed x 10  Prone Scapular Retraction Y - 1 x daily - 7 x weekly - 2 sets - 10 reps  -performed x 10 Prone Scapular Slide with Shoulder Extension - 1 x daily - 7 x weekly - 2 sets - 10 reps  -performed with 10# weights with scapular retraction instead Prone  Shoulder Horizontal Abduction - 1 x daily - 7 x weekly - 2 sets - 10 reps  -performed x 10                  [facility] Adult PT Treatment/Exercise - 09/19/20 1548      Exercises   Exercises Other Exercises    Other Exercises  PT reviewed medbridge HEP as noted below.                  PT Education - 09/19/20 1913    Education Details Discussed going on hold until RGOs received. PT again educated pt on being importance of not standing in KAFOs by himself due to risk of injury with going to sit with legs locked out.    Person(s) Educated Patient    Methods Explanation    Comprehension Verbalized understanding            PT Short Term Goals - 07/31/20 1625      PT SHORT TERM GOAL #1   Title = LTG             PT Long Term Goals - 09/19/20 1914      PT LONG TERM GOAL #1   Title Pt will demonstrate independence with final HEP    Baseline Pt  is able to perform current HEP.    Time 8    Period Weeks    Status Achieved      PT LONG TERM GOAL #2   Title Pt will participate in w/c and seating evaluation for more appropriate manual wheelchair    Baseline w/c evaluation has been completed    Time 8    Period Weeks    Status Achieved      PT LONG TERM GOAL #3   Title Pt will perform sit > stand with KAFO's with supervision and stand > sit with min A to unlock KAFO's    Baseline Unable to reassess as pt did not bring KAFOs but was still needing min/mod assist to sit in KAFOs    Time 8    Period Weeks    Status Not Met      PT LONG TERM GOAL #4   Title Pt will ambulate x 230' with KAFO's and LRAD over level, indoor surfaces and min A    Baseline 09/19/20 unable to reassess as pt did not bring KAFOs. Was mod assist 4' with KAFOs and walker last assessed    Time 8    Period Weeks    Status Not Met      PT LONG TERM GOAL #5   Title Pt will participate in full assessment for fabrication of bilat RGO    Baseline Pt has had RGO casting    Time 8    Period Weeks    Status Achieved                 Plan - 09/19/20 1918    Clinical Impression Statement PT reviewed HEP with patient today. He has met this goal as well as having completed his w/c evaluation and casting for RGOs. PT was unable to further assess gait with KAFOs as he did not bring them to session. Plan to put pt on hold until he receives RGOs and will reassess at that time.    Personal Factors and Comorbidities Comorbidity 3+;Past/Current Experience;Transportation    Comorbidities paraplegia, colostomy, spasticity, neurogenic bowel and bladder, chronic pain, pressure ulcers    Examination-Activity Limitations Locomotion Level;Stand  Examination-Participation Restrictions Community Activity;Driving    Stability/Clinical Decision Making Evolving/Moderate complexity    Rehab Potential Good    PT Frequency 2x / week    PT Duration 8 weeks    PT  Treatment/Interventions ADLs/Self Care Home Management;DME Instruction;Gait training;Functional mobility training;Therapeutic activities;Therapeutic exercise;Balance training;Neuromuscular re-education;Patient/family education;Orthotic Fit/Training;Wheelchair mobility training    PT Next Visit Plan Pt going on hold until he receives RGOs in 4-6 weeks. Reassess and update goals at that time.    Consulted and Agree with Plan of Care Patient;Family member/caregiver    Family Member Consulted Mother - [name]           Patient will benefit from skilled therapeutic intervention in order to improve the following deficits and impairments:  Abnormal gait,Decreased balance,Decreased strength,Difficulty walking,Impaired sensation,Impaired tone  Visit Diagnosis: Paraplegia, complete ([place])  Muscle weakness (generalized)     Problem List Patient Active Problem List   Diagnosis Date Noted  . 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 Paraplegia ([place]) 05/16/2020  . Spasticity 05/16/2020  . Neurogenic bladder 05/16/2020  . Wheelchair dependence 05/16/2020    Electa Sniff, PT, DPT, NCS 09/19/2020, 7:24 PM  [facility] [address] [place], [place], [address] Phone: [phone]   Fax:  [phone]  Name: Jonavan Vanhorn MRN: [id number] Date of Birth: [birth year]

## 2020-09-19 NOTE — Patient Instructions (Signed)
Access Code: [email or web address] URL: [email or web address] Date: 09/19/2020 Prepared by: Elmer Bales  Exercises Long Sitting Tricep Dip - 1 x daily - 7 x weekly - 2 sets - 10 reps Seated Shoulder Row with Anchored Resistance - 1 x daily - 7 x weekly - 2 sets - 12 reps Alternating Shoulder Press on Swiss Ball - 1 x daily - 7 x weekly - 2 sets - 10 reps Seated Shoulder External Rotation with Resistance - 1 x daily - 7 x weekly - 2 sets - 10 reps Seated Shoulder Internal Rotation with Anchored Resistance - 1 x daily - 7 x weekly - 2 sets - 10 reps Seated Single Arm Bicep Curls Supinated with Dumbbell - 1 x daily - 7 x weekly - 2 sets - 12 reps Access Code: [email or web address] URL: [email or web address] Date: 09/19/2020 Prepared by: Elmer Bales  Exercises Prone Scapular Retraction Y - 1 x daily - 7 x weekly - 2 sets - 10 reps Prone Scapular Slide with Shoulder Extension - 1 x daily - 7 x weekly - 2 sets - 10 reps Prone Shoulder Horizontal Abduction - 1 x daily - 7 x weekly - 2 sets - 10 reps

## 2021-01-15 ENCOUNTER — Other Ambulatory Visit: Payer: Self-pay

## 2021-01-15 ENCOUNTER — Encounter: Payer: Self-pay | Admitting: Physical Therapy

## 2021-01-15 ENCOUNTER — Ambulatory Visit: Payer: BLUE CROSS/BLUE SHIELD | Attending: Physical Medicine and Rehabilitation | Admitting: Physical Therapy

## 2021-01-15 DIAGNOSIS — R208 Other disturbances of skin sensation: Secondary | ICD-10-CM | POA: Diagnosis present

## 2021-01-15 DIAGNOSIS — G8221 Paraplegia, complete: Secondary | ICD-10-CM | POA: Diagnosis not present

## 2021-01-15 DIAGNOSIS — M6281 Muscle weakness (generalized): Secondary | ICD-10-CM | POA: Insufficient documentation

## 2021-01-15 DIAGNOSIS — R2689 Other abnormalities of gait and mobility: Secondary | ICD-10-CM | POA: Insufficient documentation

## 2021-01-15 DIAGNOSIS — R2681 Unsteadiness on feet: Secondary | ICD-10-CM | POA: Insufficient documentation

## 2021-01-15 DIAGNOSIS — R29818 Other symptoms and signs involving the nervous system: Secondary | ICD-10-CM | POA: Diagnosis present

## 2021-01-15 NOTE — Therapy (Signed)
[facility] [address] [place], [place], [address] Phone: [phone]   Fax:  [phone]  Physical Therapy Treatment  Patient Details  Name: Luke Velasquez MRN: [id number] Date of Birth: 10-Jan-2001 Referring Provider (PT): Curlene Dolphin, MD (Physician in [place]; care has been transferred to Genice Rouge, MD   Encounter Date: 01/15/2021   PT End of Session - 01/15/21 1434     Visit Number 25    Number of Visits 41    Date for PT Re-Evaluation 03/16/21    Authorization Type BCBS    Authorization Time Period VL: 60 PT/OT/SLP combined    Authorization - Visit Number 12   for the new year 2022   Authorization - Number of Visits 60    PT Start Time 1155   arrived late   PT Stop Time 1230    PT Time Calculation (min) 35 min    Equipment Utilized During Treatment Other (comment)   RGO   Activity Tolerance Patient tolerated treatment well    Behavior During Therapy [facility] for tasks assessed/performed             History reviewed. No pertinent past medical history.  History reviewed. No pertinent surgical history.  There were no vitals filed for this visit.   Subjective Assessment - 01/15/21 1426     Subjective Doing well, no longer having LE edema.  Had another w/c evaluation at [facility] with [employer]; is going with [employer] for new w/c and cushion.  Brought in [employer]; orthotist not able to be present for assessment today    Patient is accompained by: Family member    Pertinent History paraplegia, colostomy, spasticity, neurogenic bowel and bladder, chronic pain, pressure ulcers    Patient Stated Goals To be able to walk around the house with KAFO's.  Pt owns a standard walker.    Currently in Pain? No/denies    Pain Onset More than a month ago                [facility] PT Assessment - 01/15/21 1427       Assessment   Medical Diagnosis C8/T1 Paraplegia    Referring Provider (PT) Curlene Dolphin, MD  (Physician in [place]; care has been transferred to Genice Rouge, MD    Onset Date/Surgical Date 05/07/20    Prior Therapy yes      Precautions   Precautions Other (comment)    Precaution Comments paraplegia, colostomy, spasticity, neurogenic bowel and bladder, chronic pain, pressure ulcers      Prior Function   Level of Independence Independent with household mobility with device;Independent with community mobility with device;Independent with transfers    [employer] working on [employer]    Leisure none currently      Transfers   Transfers Sit to [employer] to [employer];Lateral/Scoot Transfers    Sit to Stand 3: Mod assist    Sit to Stand Details (indicate cue type and reason) once RGO donned, sit > stand from mat with UE support on RW and min A.  Once standing, required mod A to fully extend hips and knees to lock hips and knees into extension.    Stand to Sit 2: Max assist    Stand to Sit Details pt able to disengage hip lock but required assistance to disengage knee locks and to control stand > sit.  On second stand > sit hip locks did not fully disengage and required +2 to  sit safely on mat with LE in extension    Stand Pivot Transfers 3: Mod assist    Stand Pivot Transfer Details (indicate cue type and reason) with RW and RGO; assistance for weight shifting and LE clearance    Lateral/Scoot Transfers 6: Modified independent (Device/Increase time)      Ambulation/Gait   Ambulation/Gait Yes    Ambulation/Gait Assistance 1: +2 Total assist    Ambulation/Gait Assistance Details +2 for safety only.  PTA present to assist with RW if needed.  PT facilitated diagonal weight shifting to stance LE and trunk extension to initiate contralateral hip flexion; pt continues to have difficulty initiating with trunk extension and utilizes lifting through UE    Ambulation Distance (Feet) 115 Feet    Assistive device Rolling walker;Other (Comment)   RGO    Gait Pattern Step-through pattern;Trunk flexed;Poor foot clearance - left;Poor foot clearance - right    Ambulation Surface Level;Indoor                           [facility] Adult PT Treatment/Exercise - 01/15/21 1427       Therapeutic Activites    Therapeutic Activities Other Therapeutic Activities    Other Therapeutic Activities Demonstrated to pt how to unlock hip to ABD KAFOS for donning and how to unlock hips and knees into flexion for donning.  Donned RGO in sitting at edge of mat, increased space noted around calves and back even with straps tightened.  Discussed how to disengage hip and knee locks when ready to sit.                    PT Education - 01/15/21 1434     Education Details see TA for initial RGO education    Person(s) Educated Patient    Methods Explanation;Demonstration    Comprehension Need further instruction              PT Short Term Goals - 07/31/20 1625       PT SHORT TERM GOAL #1   Title = LTG               PT Long Term Goals - 01/15/21 1438       PT LONG TERM GOAL #1   Title Pt will demonstrate ability to set up and don RGO with min A    Baseline max-total A    Time 8    Period Weeks    Status New    Target Date 03/16/21      PT LONG TERM GOAL #2   Title Pt will engage hip lock and perform sit <> stand from elevated mat with RW and CGA    Baseline mod A    Time 8    Period Weeks    Status New    Target Date 03/16/21      PT LONG TERM GOAL #3   Title Pt will perform gait x 230' over indoor surfaces with RW, RGO and CGA    Baseline +2 115'    Time 8    Period Weeks    Status New    Target Date 03/16/21      PT LONG TERM GOAL #4   Title Pt will be able to maintain balance and fully disengage both hips and one knee to sit with improved control (with one knee in extension) with CGA.    Baseline +2 assist    Time 8  Period Weeks    Status New    Target Date 03/16/21                    Plan - 01/15/21 1436     Clinical Impression Statement Patient returns with new RGO, orthotist not present due to scheduling conflicts.  Performed initial set up/donning training, education on how to engage and disengage hip and knee locks, sit <> stand training and initial gait training with +2 for safety.  Pt continues to overutilize UE to initiate hip flexion but RGO may require re-fitting due to changes in LE size and weight loss.  Will contact orthotist and have him present for next session to assess fit.    Personal Factors and Comorbidities Comorbidity 3+;Past/Current Experience;Transportation    Comorbidities paraplegia, colostomy, spasticity, neurogenic bowel and bladder, chronic pain, pressure ulcers    Examination-Activity Limitations Locomotion Level;Stand    Examination-Participation Restrictions Community Activity;Driving    Stability/Clinical Decision Making Evolving/Moderate complexity    Rehab Potential Good    PT Frequency 2x / week    PT Duration 8 weeks    PT Treatment/Interventions ADLs/Self Care Home Management;DME Instruction;Gait training;Functional mobility training;Therapeutic activities;Therapeutic exercise;Balance training;Neuromuscular re-education;Patient/family education;Orthotic Fit/Training    PT Next Visit Plan RGO donning, sit <> stand training, gait    Consulted and Agree with Plan of Care Patient             Patient will benefit from skilled therapeutic intervention in order to improve the following deficits and impairments:  Abnormal gait, Decreased balance, Decreased strength, Difficulty walking, Impaired sensation, Impaired tone  Visit Diagnosis: Paraplegia, complete (HCC)  Muscle weakness (generalized)  Other abnormalities of gait and mobility  Unsteadiness on feet  Other disturbances of skin sensation  Other symptoms and signs involving the nervous system     Problem List Patient Active Problem List   Diagnosis Date Noted   Colostomy  in place [facility]) 05/16/2020   Paraplegia (HCC) 05/16/2020   Spasticity 05/16/2020   Neurogenic bladder 05/16/2020   Wheelchair dependence 05/16/2020    Dierdre Highman, PT, DPT 01/15/21    2:42 PM    [facility] [address] [place], [place], [address] Phone: [phone]   Fax:  [phone]  Name: Sherill Wegener MRN: [id number] Date of Birth: 2001/05/05

## 2021-01-28 ENCOUNTER — Encounter: Payer: Self-pay | Admitting: Physical Therapy

## 2021-01-28 ENCOUNTER — Ambulatory Visit: Payer: BLUE CROSS/BLUE SHIELD | Attending: Physical Medicine and Rehabilitation | Admitting: Physical Therapy

## 2021-01-28 ENCOUNTER — Other Ambulatory Visit: Payer: Self-pay

## 2021-01-28 DIAGNOSIS — R2681 Unsteadiness on feet: Secondary | ICD-10-CM | POA: Insufficient documentation

## 2021-01-28 DIAGNOSIS — G8221 Paraplegia, complete: Secondary | ICD-10-CM | POA: Insufficient documentation

## 2021-01-28 DIAGNOSIS — R2689 Other abnormalities of gait and mobility: Secondary | ICD-10-CM | POA: Diagnosis present

## 2021-01-28 DIAGNOSIS — R29818 Other symptoms and signs involving the nervous system: Secondary | ICD-10-CM | POA: Diagnosis present

## 2021-01-28 DIAGNOSIS — M6281 Muscle weakness (generalized): Secondary | ICD-10-CM | POA: Diagnosis present

## 2021-01-29 NOTE — Therapy (Signed)
[facility] [address] [place], [place], [address] Phone: [phone]   Fax:  [phone]  Physical Therapy Treatment  Patient Details  Name: Luke Velasquez MRN: [id number] Date of Birth: 2001-02-19 Referring Provider (PT): Curlene Dolphin, MD (Physician in [place]; care has been transferred to Genice Rouge, MD   Encounter Date: 01/28/2021   PT End of Session - 01/28/21 1412     Visit Number 26    Number of Visits 41    Date for PT Re-Evaluation 03/16/21    Authorization Type BCBS    Authorization Time Period VL: 60 PT/OT/SLP combined    Authorization - Visit Number 13   for the new year 2022   Authorization - Number of Visits 60    PT Start Time 1410   pt running late for appt   PT Stop Time 1445    PT Time Calculation (min) 35 min    Equipment Utilized During Treatment Other (comment)   RGO   Activity Tolerance Patient tolerated treatment well    Behavior During Therapy [facility] for tasks assessed/performed             History reviewed. No pertinent past medical history.  History reviewed. No pertinent surgical history.  There were no vitals filed for this visit.   Subjective Assessment - 01/28/21 1411     Subjective No new complaints. No falls or pain to report. Felt okay after last session, no issues    Patient is accompained by: Family member   mom   Pertinent History paraplegia, colostomy, spasticity, neurogenic bowel and bladder, chronic pain, pressure ulcers    Patient Stated Goals To be able to walk around the house with KAFO's.  Pt owns a standard walker.    Currently in Pain? No/denies    Pain Score 0-No pain              01/28/21 1412  Transfers  Transfers Sit to Stand;Stand to [employer];Lateral/Scoot Transfers  Sit to Stand 3: Mod assist  Sit to Stand Details (indicate cue type and reason) once RGO was donned, pt used RW to stand with min assist, then mod assist in  standing to fully engage RGO locks.  Stand to Sit 3: Mod assist  Stand to Sit Details after gait pt was able to self disengage both hip locks, then needed total assist on bil knee locks with mod assist for controlled descent to mat table, pt's hands on RW.  Lateral/Scoot Transfers 6: Modified independent (Device/Increase time)  Lateral/Scoot Transfer Details (indicate cue type and reason) wheelchair to mat table  Ambulation/Gait  Ambulation/Gait Yes  Ambulation/Gait Assistance 1: +2 Total assist  Ambulation/Gait Assistance Details PTA hands on to assist with weight shifting along with cues for LE advancement. Rehab tech mostly stand by for safety with occasional assist for walker position as it was pushed too far forward and for balance on 2 occasions. pt with difficulty advacning right > left LE far enough for reciprocal component of brace to function fully, needing assist at times to fully advance LE's for PTA.  Ambulation Distance (Feet) 115 Feet  Assistive device Rolling walker;Other (Comment) (wide RW, RGO)  Gait Pattern Step-through pattern;Trunk flexed;Poor foot clearance - left;Poor foot clearance - right  Ambulation Surface Level;Indoor  Gait Comments mod/max assist to don RGO in sitting and then remove it after gait.            PT Short Term Goals - 07/31/20 1625  PT SHORT TERM GOAL #1   Title = LTG               PT Long Term Goals - 01/15/21 1438       PT LONG TERM GOAL #1   Title Pt will demonstrate ability to set up and don RGO with min A    Baseline max-total A    Time 8    Period Weeks    Status New    Target Date 03/16/21      PT LONG TERM GOAL #2   Title Pt will engage hip lock and perform sit <> stand from elevated mat with RW and CGA    Baseline mod A    Time 8    Period Weeks    Status New    Target Date 03/16/21      PT LONG TERM GOAL #3   Title Pt will perform gait x 230' over indoor surfaces with RW, RGO and CGA    Baseline +2 115'     Time 8    Period Weeks    Status New    Target Date 03/16/21      PT LONG TERM GOAL #4   Title Pt will be able to maintain balance and fully disengage both hips and one knee to sit with improved control (with one knee in extension) with CGA.    Baseline +2 assist    Time 8    Period Weeks    Status New    Target Date 03/16/21                   Plan - 01/28/21 1412     Clinical Impression Statement Today's skilled session continued to focus on use of RGO with wide RW for gait with 2 person assist (2cd person stand by to min assist for safety). Continued to work on donning/doffing while seated at edge of mat with mom present today and education on how parts work to engage and disengage locks. Pt with improved sitting after gait this session, able to fully self unlock hips, then assisted with unlocking knees and for controlled descent with sitting with 2 person assist. The orthotist is planning to be a the next session to assess fit of RGO. The pt should benefit from continued PT to progress toward unmet goals.    Personal Factors and Comorbidities Comorbidity 3+;Past/Current Experience;Transportation    Comorbidities paraplegia, colostomy, spasticity, neurogenic bowel and bladder, chronic pain, pressure ulcers    Examination-Activity Limitations Locomotion Level;Stand    Examination-Participation Restrictions Community Activity;Driving    Stability/Clinical Decision Making Evolving/Moderate complexity    Rehab Potential Good    PT Frequency 2x / week    PT Duration 8 weeks    PT Treatment/Interventions ADLs/Self Care Home Management;DME Instruction;Gait training;Functional mobility training;Therapeutic activities;Therapeutic exercise;Balance training;Neuromuscular re-education;Patient/family education;Orthotic Fit/Training    PT Next Visit Plan RGO donning, sit <> stand training, gait    Consulted and Agree with Plan of Care Patient             Patient will benefit from  skilled therapeutic intervention in order to improve the following deficits and impairments:  Abnormal gait, Decreased balance, Decreased strength, Difficulty walking, Impaired sensation, Impaired tone  Visit Diagnosis: Paraplegia, complete (HCC)  Muscle weakness (generalized)  Other abnormalities of gait and mobility  Unsteadiness on feet     Problem List Patient Active Problem List   Diagnosis Date Noted   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

## 2021-01-30 ENCOUNTER — Other Ambulatory Visit: Payer: Self-pay

## 2021-01-30 ENCOUNTER — Ambulatory Visit: Payer: BLUE CROSS/BLUE SHIELD | Admitting: Physical Therapy

## 2021-01-30 DIAGNOSIS — G8221 Paraplegia, complete: Secondary | ICD-10-CM

## 2021-01-30 DIAGNOSIS — R2689 Other abnormalities of gait and mobility: Secondary | ICD-10-CM

## 2021-01-30 DIAGNOSIS — M6281 Muscle weakness (generalized): Secondary | ICD-10-CM

## 2021-01-30 DIAGNOSIS — R2681 Unsteadiness on feet: Secondary | ICD-10-CM

## 2021-01-30 DIAGNOSIS — R29818 Other symptoms and signs involving the nervous system: Secondary | ICD-10-CM

## 2021-01-31 NOTE — Therapy (Signed)
[facility] [address] [place], [place], [address] Phone: [phone]   Fax:  [phone]  Physical Therapy Treatment  Patient Details  Name: Luke Velasquez MRN: [id number] Date of Birth: [birth year] Referring Provider (PT): Curlene Dolphin, MD (Physician in [place]; care has been transferred to Genice Rouge, MD   Encounter Date: 01/30/2021   PT End of Session - 01/31/21 1343     Visit Number 27    Number of Visits 41    Date for PT Re-Evaluation 03/16/21    Authorization Type BCBS    Authorization Time Period VL: 60 PT/OT/SLP combined    Authorization - Visit Number 14   for the new year 2022   Authorization - Number of Visits 60    PT Start Time 1445    PT Stop Time 1530    PT Time Calculation (min) 45 min    Equipment Utilized During Treatment Other (comment)   RGO   Activity Tolerance Patient tolerated treatment well    Behavior During Therapy [facility] for tasks assessed/performed             No past medical history on file.  No past surgical history on file.  There were no vitals filed for this visit.   Subjective Assessment - 01/31/21 1352     Subjective Orthotist present this session to assess fit and function of RGO.  Mother reports they decided to go with w/c company that performed assessment at [facility] for new wheelchair; PT to alert [employer] to cancel w/c order from [employer].    Patient is accompained by: Family member   mom   Pertinent History paraplegia, colostomy, spasticity, neurogenic bowel and bladder, chronic pain, pressure ulcers    Patient Stated Goals To be able to walk around the house with KAFO's.  Pt owns a standard walker.    Currently in Pain? No/denies            Donned RGO edge of mat with pt performing 50%.  Performed sit > stand from mat with UE support on RW with min A.    Performed ambulation x 40' with RW, RGO and mod A from PT with pt demonstrating improved use of trunk  extension and diagonal weight shifting to initiate LE advancement; required increased assistance for weight shift to initiate swing and clear LLE.  Required 2 sitting rest breaks due to UE fatigue.  Only able to ambulate x 15' during second rep due to UE fatigue and greater difficulty using trunk extension to advance each LE.  While sitting, standing and ambulating orthotist assessed fit and function of RGO.    Orthotist to make modifications to lower leg to decrease pressure on fibular head, padding to lower leg to encourage knee extension but prevent hyperextension due to increased space now, adjust height of thoracic back support on trunk and add padding to improve use of trunk extension but to also take pressure off of sacral wound.  Orthotist to return RGO on Monday.  Advised pt and mother to make more appointments to ensure 2x/week frequency.   PT Education - 01/31/21 1354     Education Details RGO modifications to be performed.  Need to schedule more visits    Person(s) Educated Patient;Parent(s)    Methods Explanation    Comprehension Verbalized understanding              PT Short Term Goals - 07/31/20 1625       PT SHORT TERM GOAL #  1   Title = LTG               PT Long Term Goals - 01/15/21 1438       PT LONG TERM GOAL #1   Title Pt will demonstrate ability to set up and don RGO with min A    Baseline max-total A    Time 8    Period Weeks    Status New    Target Date 03/16/21      PT LONG TERM GOAL #2   Title Pt will engage hip lock and perform sit <> stand from elevated mat with RW and CGA    Baseline mod A    Time 8    Period Weeks    Status New    Target Date 03/16/21      PT LONG TERM GOAL #3   Title Pt will perform gait x 230' over indoor surfaces with RW, RGO and CGA    Baseline +2 115'    Time 8    Period Weeks    Status New    Target Date 03/16/21      PT LONG TERM GOAL #4   Title Pt will be able to maintain balance and fully disengage both  hips and one knee to sit with improved control (with one knee in extension) with CGA.    Baseline +2 assist    Time 8    Period Weeks    Status New    Target Date 03/16/21                   Plan - 01/31/21 1344     Clinical Impression Statement Continued to focus on use of RGO - pt continues to require mod-max A to don at edge of mat and cues to engage hips prior to standing.  Continues to require assistance to unlock knees prior to sitting.  Orthotist present to assess fit and function of RGO - due to reduction in LE edema, atrophy and weight loss and presence of sacral wound orthotist to provide extra padding in LE and back as well as trim down thoracic back brace to improve ability to extend and engage springs and remove pressure, friction and shear from wound.  With appropriate extension and weight shifting today pt able to advance each LE with decreased assistance; pt does continue to experience UE fatigue and required increased sitting rest breaks today.    Personal Factors and Comorbidities Comorbidity 3+;Past/Current Experience;Transportation    Comorbidities paraplegia, colostomy, spasticity, neurogenic bowel and bladder, chronic pain, pressure ulcers    Examination-Activity Limitations Locomotion Level;Stand    Examination-Participation Restrictions Community Activity;Driving    Stability/Clinical Decision Making Evolving/Moderate complexity    Rehab Potential Good    PT Frequency 2x / week    PT Duration 8 weeks    PT Treatment/Interventions ADLs/Self Care Home Management;DME Instruction;Gait training;Functional mobility training;Therapeutic activities;Therapeutic exercise;Balance training;Neuromuscular re-education;Patient/family education;Orthotic Fit/Training    PT Next Visit Plan RGO donning, sit <> stand training, gait    Consulted and Agree with Plan of Care Patient;Family member/caregiver    Family Member Consulted Mother - [name]             Patient will  benefit from skilled therapeutic intervention in order to improve the following deficits and impairments:  Abnormal gait, Decreased balance, Decreased strength, Difficulty walking, Impaired sensation, Impaired tone  Visit Diagnosis: Paraplegia, complete (HCC)  Muscle weakness (generalized)  Other abnormalities of gait and mobility  Unsteadiness on feet  Other symptoms and signs involving the nervous system     Problem List Patient Active Problem List   Diagnosis Date Noted   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

## 2021-04-03 ENCOUNTER — Ambulatory Visit: Payer: BLUE CROSS/BLUE SHIELD | Attending: Physical Medicine and Rehabilitation | Admitting: Physical Therapy

## 2021-04-03 ENCOUNTER — Other Ambulatory Visit: Payer: Self-pay

## 2021-04-03 DIAGNOSIS — R29818 Other symptoms and signs involving the nervous system: Secondary | ICD-10-CM

## 2021-04-03 DIAGNOSIS — M6281 Muscle weakness (generalized): Secondary | ICD-10-CM | POA: Diagnosis present

## 2021-04-03 DIAGNOSIS — R2681 Unsteadiness on feet: Secondary | ICD-10-CM | POA: Diagnosis present

## 2021-04-03 DIAGNOSIS — G8221 Paraplegia, complete: Secondary | ICD-10-CM

## 2021-04-03 DIAGNOSIS — R208 Other disturbances of skin sensation: Secondary | ICD-10-CM | POA: Diagnosis present

## 2021-04-03 DIAGNOSIS — R2689 Other abnormalities of gait and mobility: Secondary | ICD-10-CM

## 2021-04-05 NOTE — Therapy (Signed)
[place] [address] [place], [place], [address] Phone: [phone]   Fax:  [phone]  Physical Therapy Treatment  Patient Details  Name: Luke Velasquez MRN: [id number] Date of Birth: 10-31-2000 Referring Provider (PT): Glennie Isle, MD (Physician in [place]; care has been transferred to Courtney Heys, MD   Encounter Date: 04/03/2021   PT End of Session - 04/05/21 1747     Visit Number 28    Number of Visits 36    Date for PT Re-Evaluation 06/04/21    Authorization Type BCBS    Authorization Time Period VL: 60 PT/OT/SLP combined    Authorization - Visit Number 15   for the new year 2022   Authorization - Number of Visits 60    PT Start Time 1150    PT Stop Time 1235    PT Time Calculation (min) 45 min    Equipment Utilized During Treatment Other (comment)   RGO   Activity Tolerance Patient tolerated treatment well    Behavior During Therapy [facility] for tasks assessed/performed             No past medical history on file.  No past surgical history on file.  There were no vitals filed for this visit.    04/03/21 1155  Symptoms/Limitations  Subjective Pt reports wound just closed up.  Has new manual wheelchair with ROHO cushion.  Is interested in pursuing a standing frame to help with pressure relief.  Reports the [employer] has one he can bring for trial, to just let him know.  Patient is accompained by: Family member (mom)  Pertinent History paraplegia, colostomy, spasticity, neurogenic bowel and bladder, chronic pain, pressure ulcers  Patient Stated Goals To be able to walk around the house with KAFO's.  Pt owns a standard walker.  Pain Assessment  Currently in Pain? No/denies       [facility] PT Assessment - 04/05/21 1753       Assessment   Medical Diagnosis C8/T1 Paraplegia    Referring Provider (PT) Glennie Isle, MD (Physician in [place]; care has been transferred to Courtney Heys, MD    Onset  Date/Surgical Date 05/07/20    Prior Therapy yes      Precautions   Precautions Other (comment)    Precaution Comments paraplegia, colostomy, spasticity, neurogenic bowel and bladder, chronic pain, pressure ulcers    Required Braces or Orthoses Other Brace/Splint    Other Brace/Splint bilat RGO      Prior Function   Level of Independence Independent with household mobility with device;Independent with community mobility with device;Independent with transfers             Pt arrived in new manual wheelchair with ROHO cushion.  Pt reporting improved pressure relief and pressure would healing.  Transferred to mat and initiated donning of RGO to assess fit since adjustments made.  Pt continues to require max A to set up RGO and initiate donning on LE.  Able to position L pelvis back in brace and LLE in KAFO portion without difficulty.  When attempting to position R pelvis back, pelvis blocked by padding at iliac crest and rubbing along R greater trochanter.  Contacted orthotist who arrived at clinic to assess fit.  Noted that R femur protruded >4 inches out past knee hinge in sitting and unable to secure lower strap around tibia/fibula.  With assistance of orthotist, transitioned into standing with therapist assisting to engage knee extension lock once in standing.  Able  to reposition RLE in brace once standing and pt demonstrated improved ability to engage hip flexion spring with trunk extension and weight shift but pt reporting rubbing along R hip with LE advancement.  Returned to sitting with +2 assistance; orthotist made notes and markings to adjust padding in back brace and to flare R side of back brace.  Orthotist to have RGO returned by next week.  Therapist assisted pt in setting up appointments for the next 4 weeks and contacted [employer] to discuss trial of standing frame during therapy session.  LVM with [employer].     PT Education - 04/05/21 1754     Education Details PT to reach out to w/c  vendor to set up trial for standing frame; further adjustments to RGO required, scheduling more visits    Person(s) Educated Patient;Parent(s)    Methods Explanation    Comprehension Verbalized understanding              PT Short Term Goals - 07/31/20 1625       PT SHORT TERM GOAL #1   Title = LTG               PT Long Term Goals - 04/05/21 1748       PT LONG TERM GOAL #1   Title Pt will demonstrate ability to set up and don RGO with min A    Baseline max-total A    Time 8    Period Weeks    Status On-going    Target Date 06/04/21      PT LONG TERM GOAL #2   Title Pt will engage hip lock and perform sit <> stand from elevated mat with RW and CGA    Baseline Min A to stand; min-mod A to engage knee extension lock    Time 8    Period Weeks    Status On-going    Target Date 06/04/21      PT LONG TERM GOAL #3   Title Pt will perform gait x 230' over indoor surfaces with RW, RGO and CGA    Baseline +2 115' with RW    Time 8    Period Weeks    Status On-going    Target Date 06/04/21      PT LONG TERM GOAL #4   Title Pt will be able to maintain balance and fully disengage both hips and one knee to sit with improved control (with one knee in extension) with CGA.    Baseline +2 assist    Time 8    Period Weeks    Status On-going    Target Date 06/04/21      PT LONG TERM GOAL #5   Title Pt will participate in assessment for standing frame with w/c vendor to assist with pressure relief and joint ROM    Time 8    Period Weeks    Status New    Target Date 06/04/21                   Plan - 04/05/21 1752     Clinical Impression Statement Performed assessment of fit and function of RGO following adjustments made by orthotist.  Padding in calf portion assisted with maintaining knee extension moment and padding in trunk brace did assist with reducing gap between patient's back and brace but due to increased padding on R side and narrowing of brace around  hip pt unable to position hips and pelvis all the way back in  brace causing R femur to protrude fowards past knee hinge >4inches.  Orthotist present to problem solve fit.  In standing able to reposition pelvis and LE in brace but unable to ambulate due to rubbing against hip/greater trochanter.  No shear/friction noted on sacral wound today.  Orthotist to take RGO and perform adjustments to padding in back and flare out R side of back brace.  Pt to return next week to trial new fit and restart gait training.  No goals met; all goals ongoing for new certification period.    Personal Factors and Comorbidities Comorbidity 3+;Past/Current Experience;Transportation    Comorbidities paraplegia, colostomy, spasticity, neurogenic bowel and bladder, chronic pain, pressure ulcers    Examination-Activity Limitations Locomotion Level;Stand    Examination-Participation Restrictions Community Activity;Driving    Rehab Potential Good    PT Frequency 1x / week    PT Duration 8 weeks    PT Treatment/Interventions ADLs/Self Care Home Management;DME Instruction;Gait training;Functional mobility training;Therapeutic activities;Therapeutic exercise;Balance training;Neuromuscular re-education;Patient/family education;Orthotic Fit/Training    PT Next Visit Plan How does RGO fit in back?  RGO donning, sit <> stand training, gait.  Trial standing frame with [employer].    Consulted and Agree with Plan of Care Patient;Family member/caregiver    Family Member Consulted Mother - [name]             Patient will benefit from skilled therapeutic intervention in order to improve the following deficits and impairments:  Abnormal gait, Decreased balance, Decreased strength, Difficulty walking, Impaired sensation, Impaired tone  Visit Diagnosis: Paraplegia, complete (HCC)  Muscle weakness (generalized)  Unsteadiness on feet  Other abnormalities of gait and mobility  Other symptoms and signs involving the nervous  system  Other disturbances of skin sensation     Problem List Patient Active Problem List   Diagnosis Date Noted   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

## 2021-04-10 ENCOUNTER — Ambulatory Visit: Payer: BLUE CROSS/BLUE SHIELD | Admitting: Physical Therapy

## 2021-04-10 ENCOUNTER — Encounter: Payer: Self-pay | Admitting: Physical Therapy

## 2021-04-10 ENCOUNTER — Other Ambulatory Visit: Payer: Self-pay

## 2021-04-10 DIAGNOSIS — R2681 Unsteadiness on feet: Secondary | ICD-10-CM

## 2021-04-10 DIAGNOSIS — M6281 Muscle weakness (generalized): Secondary | ICD-10-CM

## 2021-04-10 DIAGNOSIS — R2689 Other abnormalities of gait and mobility: Secondary | ICD-10-CM

## 2021-04-10 DIAGNOSIS — R29818 Other symptoms and signs involving the nervous system: Secondary | ICD-10-CM

## 2021-04-10 DIAGNOSIS — G8221 Paraplegia, complete: Secondary | ICD-10-CM

## 2021-04-10 NOTE — Therapy (Signed)
[facility] [address] [place], [place], [address] Phone: [phone]   Fax:  [phone]  Physical Therapy Treatment  Patient Details  Name: Luke Velasquez MRN: [id number] Date of Birth: 2000-07-26 Referring Provider (PT): Curlene Dolphin, MD (Physician in [place]; care has been transferred to Genice Rouge, MD   Encounter Date: 04/10/2021   PT End of Session - 04/10/21 1238     Visit Number 29    Number of Visits 36    Date for PT Re-Evaluation 06/04/21    Authorization Type BCBS    Authorization Time Period VL: 60 PT/OT/SLP combined    Authorization - Visit Number 16   for the new year 2022   Authorization - Number of Visits 60    PT Start Time 1233    PT Stop Time 1315    PT Time Calculation (min) 42 min    Equipment Utilized During Treatment Other (comment)   RGO   Activity Tolerance Patient tolerated treatment well    Behavior During Therapy [facility] for tasks assessed/performed             History reviewed. No pertinent past medical history.  History reviewed. No pertinent surgical history.  There were no vitals filed for this visit.   Subjective Assessment - 04/10/21 1235     Subjective No new complaints. Continues with right shoulder blade pain.    Pertinent History paraplegia, colostomy, spasticity, neurogenic bowel and bladder, chronic pain, pressure ulcers    Patient Stated Goals To be able to walk around the house with KAFO's.  Pt owns a standard walker.    Currently in Pain? Yes    Pain Score 4     Pain Location Scapula    Pain Orientation Right    Pain Descriptors / Indicators Aching;Sore    Pain Type Chronic pain    Pain Onset More than a month ago    Pain Frequency Intermittent    Aggravating Factors  certain movements    Pain Relieving Factors unsure, "maybe stretching"                   [facility] Adult PT Treatment/Exercise - 04/10/21 1931       Transfers   Transfers Sit  to Stand;Stand to Sit    Sit to Stand 3: Mod assist;From elevated surface;With upper extremity assist;From bed    Sit to Stand Details Verbal cues for sequencing;Verbal cues for technique;Verbal cues for safe use of DME/AE    Sit to Stand Details (indicate cue type and reason) once RGO was fully donned pt had both hand on RW to stand from mat with left LE locked into extension. once in standing mod assist to fully engage all locks at hips and right LE.    Stand to Sit 3: Mod assist;With upper extremity assist;To bed    Stand to Sit Details (indicate cue type and reason) Verbal cues for sequencing;Verbal cues for technique;Verbal cues for safe use of DME/AE    Stand to Sit Details at edge of mat in standing pt able to self undo pelvic locks with single UE support on walker, pt then advanced left LE out while staying locked, and PTA unlocked right LE, mod assist for controlled descent with bil UE's on walker.    Lateral/Scoot Transfers 6: Modified independent (Device/Increase time)    Lateral/Scoot Transfer Details (indicate cue type and reason) wheelchair <> mat table    Number of Reps --   sit<>sttand performed  x 2 reps.     Ambulation/Gait   Ambulation/Gait Yes    Ambulation/Gait Assistance 1: +2 Total assist    Ambulation/Gait Assistance Details PTA hands on at pt's trunk/pelvis to assist with weight shifting and postural control to assist with LE advancement. Assist needed at times due to shoes scuffing on floor to fully complete advancement. 2cd therapist in front to assist with walker management and balance assistance as needed. As gait progressed pt's weight shifting progressed with less assistance needed with LE advancement.    Ambulation Distance (Feet) 115 Feet   x2 reps   Assistive device Rolling walker;Other (Comment)   RGO   Gait Pattern Step-through pattern;Trunk flexed;Poor foot clearance - left;Poor foot clearance - right    Ambulation Surface Level;Indoor      Therapeutic  Activites    Therapeutic Activities Other Therapeutic Activities    Other Therapeutic Activities pt able to assist with donning RGO while seated edge of mat. Pt able to engage bil hip locks both times prior to standing. Total assist to don/doff shoes over RGO.                       PT Short Term Goals - 07/31/20 1625       PT SHORT TERM GOAL #1   Title = LTG               PT Long Term Goals - 04/05/21 1748       PT LONG TERM GOAL #1   Title Pt will demonstrate ability to set up and don RGO with min A    Baseline max-total A    Time 8    Period Weeks    Status On-going    Target Date 06/04/21      PT LONG TERM GOAL #2   Title Pt will engage hip lock and perform sit <> stand from elevated mat with RW and CGA    Baseline Min A to stand; min-mod A to engage knee extension lock    Time 8    Period Weeks    Status On-going    Target Date 06/04/21      PT LONG TERM GOAL #3   Title Pt will perform gait x 230' over indoor surfaces with RW, RGO and CGA    Baseline +2 115' with RW    Time 8    Period Weeks    Status On-going    Target Date 06/04/21      PT LONG TERM GOAL #4   Title Pt will be able to maintain balance and fully disengage both hips and one knee to sit with improved control (with one knee in extension) with CGA.    Baseline +2 assist    Time 8    Period Weeks    Status On-going    Target Date 06/04/21      PT LONG TERM GOAL #5   Title Pt will participate in assessment for standing frame with w/c vendor to assist with pressure relief and joint ROM    Time 8    Period Weeks    Status New    Target Date 06/04/21                   Plan - 04/10/21 1238     Clinical Impression Statement Today's skilled session continued to focus on use of RGO with transfers and gait. As gait progresses pt demo'd improved ability to weight shift with  trunk to advance LE's with RGO. The RGO still is pushing into pt's right greater trochanter area. Will  call othotist and notify him. The pt is making progress and should benefit from continued PT to progress toward unmet goals.    Personal Factors and Comorbidities Comorbidity 3+;Past/Current Experience;Transportation    Comorbidities paraplegia, colostomy, spasticity, neurogenic bowel and bladder, chronic pain, pressure ulcers    Examination-Activity Limitations Locomotion Level;Stand    Examination-Participation Restrictions Community Activity;Driving    Rehab Potential Good    PT Frequency 1x / week    PT Duration 8 weeks    PT Treatment/Interventions ADLs/Self Care Home Management;DME Instruction;Gait training;Functional mobility training;Therapeutic activities;Therapeutic exercise;Balance training;Neuromuscular re-education;Patient/family education;Orthotic Fit/Training    PT Next Visit Plan RGO donning, sit <> stand training, gait.  Trial standing frame with [employer] is scheduled for 04/30/21 session.    Consulted and Agree with Plan of Care Patient;Family member/caregiver    Family Member Consulted Mother - [name]             Patient will benefit from skilled therapeutic intervention in order to improve the following deficits and impairments:  Abnormal gait, Decreased balance, Decreased strength, Difficulty walking, Impaired sensation, Impaired tone  Visit Diagnosis: Paraplegia, complete (HCC)  Muscle weakness (generalized)  Unsteadiness on feet  Other abnormalities of gait and mobility  Other symptoms and signs involving the nervous system     Problem List Patient Active Problem List   Diagnosis Date Noted   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 [place], [place], [address] Phone: [phone]   Fax:   [phone]  Name: Luke Velasquez MRN: [id number] Date of Birth: 25-Jan-2001

## 2021-04-17 ENCOUNTER — Ambulatory Visit: Payer: BLUE CROSS/BLUE SHIELD | Admitting: Physical Therapy

## 2021-04-24 ENCOUNTER — Ambulatory Visit: Payer: BLUE CROSS/BLUE SHIELD | Attending: Physical Medicine and Rehabilitation | Admitting: Physical Therapy

## 2021-04-24 ENCOUNTER — Other Ambulatory Visit: Payer: Self-pay

## 2021-04-24 ENCOUNTER — Encounter: Payer: Self-pay | Admitting: Physical Therapy

## 2021-04-24 DIAGNOSIS — R29818 Other symptoms and signs involving the nervous system: Secondary | ICD-10-CM | POA: Diagnosis present

## 2021-04-24 DIAGNOSIS — R2689 Other abnormalities of gait and mobility: Secondary | ICD-10-CM | POA: Diagnosis present

## 2021-04-24 DIAGNOSIS — G8221 Paraplegia, complete: Secondary | ICD-10-CM | POA: Insufficient documentation

## 2021-04-24 DIAGNOSIS — M6281 Muscle weakness (generalized): Secondary | ICD-10-CM | POA: Insufficient documentation

## 2021-04-24 DIAGNOSIS — R2681 Unsteadiness on feet: Secondary | ICD-10-CM | POA: Insufficient documentation

## 2021-04-24 DIAGNOSIS — R208 Other disturbances of skin sensation: Secondary | ICD-10-CM | POA: Diagnosis present

## 2021-04-25 NOTE — Therapy (Signed)
[facility] [address] [place], [place], [address] Phone: [phone]   Fax:  [phone]  Physical Therapy Treatment  Patient Details  Name: Luke Velasquez MRN: [id number] Date of Birth: [birth year] Referring Provider (PT): Curlene Dolphin, MD (Physician in [place]; care has been transferred to Genice Rouge, MD   Encounter Date: 04/24/2021   PT End of Session - 04/24/21 1539     Visit Number 30    Number of Visits 36    Date for PT Re-Evaluation 06/04/21    Authorization Type BCBS    Authorization Time Period VL: 60 PT/OT/SLP combined    Authorization - Visit Number 17   for the new year 2022   Authorization - Number of Visits 60    PT Start Time 1536   pt running late today   PT Stop Time 1615    PT Time Calculation (min) 39 min    Equipment Utilized During Treatment Other (comment)   RGO   Activity Tolerance Patient tolerated treatment well    Behavior During Therapy [facility] for tasks assessed/performed             History reviewed. No pertinent past medical history.  History reviewed. No pertinent surgical history.  There were no vitals filed for this visit.   Subjective Assessment - 04/24/21 1538     Subjective No new complains. Continues with back pain and right shoulder blade pain.    Patient is accompained by: Family member   mom   Pertinent History paraplegia, colostomy, spasticity, neurogenic bowel and bladder, chronic pain, pressure ulcers    Patient Stated Goals To be able to walk around the house with KAFO's.  Pt owns a standard walker.    Currently in Pain? Yes    Pain Score 5     Pain Location Generalized    Pain Descriptors / Indicators Aching;Sore    Pain Type Chronic pain    Pain Onset More than a month ago    Pain Frequency Intermittent    Aggravating Factors  certain movements    Pain Relieving Factors unsure, "maybe stretching"                  [facility] Adult PT  Treatment/Exercise - 04/24/21 1540       Transfers   Transfers Sit to Stand;Stand to Sit    Sit to Stand 3: Mod assist;From elevated surface;With upper extremity assist;From bed    Sit to Stand Details (indicate cue type and reason) had left LE locked out into extension with pt hands on the walker. once standing bil hips and right LE locks engaged    Stand to Sit 3: Mod assist;With upper extremity assist;To bed    Stand to Sit Details once next to mat pt was able to self unlock bil hip locks, with left LE out PTA unlocked right side and assisted with controlled descent with UE's on walker. pt then self unlocked the left Le.      Ambulation/Gait   Ambulation/Gait Yes    Ambulation/Gait Assistance 3: Mod assist;2: Max assist   plus 2cd person providing min guard assist for safety.   Ambulation/Gait Assistance Details PTA behind pt with hands at trunk portion of RGO to assist with facilitation of weight shifting to advance LE's. Pt with improved trunk movements to self advance LE's as gait progressed. occasional assistance needed due to walker getting too far away with 2cd person assisting with this.    Ambulation Distance (  Feet) 115 Feet   x2 reps   Assistive device Rolling walker;Other (Comment)    Gait Pattern Step-through pattern;Trunk flexed;Poor foot clearance - left;Poor foot clearance - right    Ambulation Surface Level;Indoor      Therapeutic Activites    Therapeutic Activities Other Therapeutic Activities    Other Therapeutic Activities pt able to assist with donning RGO while seated edge of mat. Pt able to engage bil hip locks both times prior to standing. Total assist to don/doff shoes over RGO.                       PT Short Term Goals - 07/31/20 1625       PT SHORT TERM GOAL #1   Title = LTG               PT Long Term Goals - 04/05/21 1748       PT LONG TERM GOAL #1   Title Pt will demonstrate ability to set up and don RGO with min A    Baseline  max-total A    Time 8    Period Weeks    Status On-going    Target Date 06/04/21      PT LONG TERM GOAL #2   Title Pt will engage hip lock and perform sit <> stand from elevated mat with RW and CGA    Baseline Min A to stand; min-mod A to engage knee extension lock    Time 8    Period Weeks    Status On-going    Target Date 06/04/21      PT LONG TERM GOAL #3   Title Pt will perform gait x 230' over indoor surfaces with RW, RGO and CGA    Baseline +2 115' with RW    Time 8    Period Weeks    Status On-going    Target Date 06/04/21      PT LONG TERM GOAL #4   Title Pt will be able to maintain balance and fully disengage both hips and one knee to sit with improved control (with one knee in extension) with CGA.    Baseline +2 assist    Time 8    Period Weeks    Status On-going    Target Date 06/04/21      PT LONG TERM GOAL #5   Title Pt will participate in assessment for standing frame with w/c vendor to assist with pressure relief and joint ROM    Time 8    Period Weeks    Status New    Target Date 06/04/21                   Plan - 04/24/21 1540     Clinical Impression Statement Today's skilled session continued to focus on use of RGO with transfers and gait. Pt required less overall assistance with gait this session as 2cd person was standy by for safety. The pt is making steady progress toward goals and should benefit from continued PT to progress toward unmet goals.    Personal Factors and Comorbidities Comorbidity 3+;Past/Current Experience;Transportation    Comorbidities paraplegia, colostomy, spasticity, neurogenic bowel and bladder, chronic pain, pressure ulcers    Examination-Activity Limitations Locomotion Level;Stand    Examination-Participation Restrictions Community Activity;Driving    Rehab Potential Good    PT Frequency 1x / week    PT Duration 8 weeks    PT Treatment/Interventions ADLs/Self Care Home Management;DME Instruction;Gait  training;Functional mobility training;Therapeutic activities;Therapeutic exercise;Balance training;Neuromuscular re-education;Patient/family education;Orthotic Fit/Training    PT Next Visit Plan RGO donning, sit <> stand training, gait.  Trial standing frame with [employer] is scheduled for 04/30/21 session.    Consulted and Agree with Plan of Care Patient;Family member/caregiver    Family Member Consulted Mother - [name]             Patient will benefit from skilled therapeutic intervention in order to improve the following deficits and impairments:  Abnormal gait, Decreased balance, Decreased strength, Difficulty walking, Impaired sensation, Impaired tone  Visit Diagnosis: Paraplegia, complete (HCC)  Muscle weakness (generalized)  Unsteadiness on feet  Other abnormalities of gait and mobility  Other symptoms and signs involving the nervous system     Problem List Patient Active Problem List   Diagnosis Date Noted   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

## 2021-04-30 ENCOUNTER — Ambulatory Visit: Payer: BLUE CROSS/BLUE SHIELD | Admitting: Physical Therapy

## 2021-04-30 ENCOUNTER — Encounter: Payer: Self-pay | Admitting: Physical Therapy

## 2021-04-30 ENCOUNTER — Other Ambulatory Visit: Payer: Self-pay

## 2021-04-30 DIAGNOSIS — R2689 Other abnormalities of gait and mobility: Secondary | ICD-10-CM

## 2021-04-30 DIAGNOSIS — G8221 Paraplegia, complete: Secondary | ICD-10-CM | POA: Diagnosis not present

## 2021-04-30 DIAGNOSIS — R2681 Unsteadiness on feet: Secondary | ICD-10-CM

## 2021-04-30 DIAGNOSIS — M6281 Muscle weakness (generalized): Secondary | ICD-10-CM

## 2021-04-30 NOTE — Therapy (Signed)
[facility] [address] [place], [place], [address] Phone: [phone]   Fax:  [phone]  Physical Therapy Treatment  Patient Details  Name: Luke Velasquez MRN: [id number] Date of Birth: [birth year] Referring Provider (PT): Curlene Dolphin, MD (Physician in [place]; care has been transferred to Genice Rouge, MD   Encounter Date: 04/30/2021   PT End of Session - 04/30/21 1537     Visit Number 31    Number of Visits 36    Date for PT Re-Evaluation 06/04/21    Authorization Type BCBS    Authorization Time Period VL: 60 PT/OT/SLP combined    Authorization - Visit Number 18   for the new year 2022   Authorization - Number of Visits 60    PT Start Time 1533    PT Stop Time 1615    PT Time Calculation (min) 42 min    Equipment Utilized During Treatment Other (comment)   RGO   Activity Tolerance Patient tolerated treatment well    Behavior During Therapy [facility] for tasks assessed/performed             History reviewed. No pertinent past medical history.  History reviewed. No pertinent surgical history.  There were no vitals filed for this visit.   Subjective Assessment - 04/30/21 1536     Subjective No new complaitns. No falls.    Pertinent History paraplegia, colostomy, spasticity, neurogenic bowel and bladder, chronic pain, pressure ulcers    Patient Stated Goals To be able to walk around the house with KAFO's.  Pt owns a standard walker.    Currently in Pain? Yes    Pain Location Generalized   back and rib areas   Pain Orientation Right    Pain Descriptors / Indicators Aching                [facility] PT Assessment - 04/30/21 1555       PROM   Overall PROM Comments bil LE'S: PROM right LE: hip extension -3 degrees from neutral, knee extension 0 degrees, knee flexion 130 degrees and ankle DF 10 degrees. Left LE: hip extension -7 degrees from neutral, knee extension -5 degrees from neutral, knee flexion  133 degrees, and ankle DF 5 degrees.               [facility] Adult PT Treatment/Exercise - 04/30/21 1555       Transfers   Transfers Sit to Stand;Stand to Sit;Lateral/Scoot Transfers    Sit to Stand 3: Mod assist;From elevated surface;With upper extremity assist;From bed    Stand to Sit 3: Mod assist;With upper extremity assist;To bed    Lateral/Scoot Transfers 6: Modified independent (Device/Increase time)    Lateral/Scoot Transfer Details (indicate cue type and reason) wheelchair<>mat table      Therapeutic Activites    Therapeutic Activities Other Therapeutic Activities    Other Therapeutic Activities use of Easystand Strap Stand in clinic today for trial of standing frame for home use. Pt required one person to assist with keeping stand strap in place and to move the level to elevate pt to standing and then at the end of standing to lower pt back down. Pt able to stand for 10-12 minutes in standing frame with no issues, VSS. On discussion with representative from [employer] would look to get pt the standing frame with a seat  ([employer] model) so that he will be better supported and have the ability to use it on his own safety. Pt mod  I for sitting edge of mat with limited to no UE support for set up of use of frame and removal of frame, including using UE's to lift feet onto/off of foot plate.                  PT Short Term Goals - 07/31/20 1625       PT SHORT TERM GOAL #1   Title = LTG               PT Long Term Goals - 04/05/21 1748       PT LONG TERM GOAL #1   Title Pt will demonstrate ability to set up and don RGO with min A    Baseline max-total A    Time 8    Period Weeks    Status On-going    Target Date 06/04/21      PT LONG TERM GOAL #2   Title Pt will engage hip lock and perform sit <> stand from elevated mat with RW and CGA    Baseline Min A to stand; min-mod A to engage knee extension lock    Time 8    Period Weeks    Status On-going    Target Date  06/04/21      PT LONG TERM GOAL #3   Title Pt will perform gait x 230' over indoor surfaces with RW, RGO and CGA    Baseline +2 115' with RW    Time 8    Period Weeks    Status On-going    Target Date 06/04/21      PT LONG TERM GOAL #4   Title Pt will be able to maintain balance and fully disengage both hips and one knee to sit with improved control (with one knee in extension) with CGA.    Baseline +2 assist    Time 8    Period Weeks    Status On-going    Target Date 06/04/21      PT LONG TERM GOAL #5   Title Pt will participate in assessment for standing frame with w/c vendor to assist with pressure relief and joint ROM    Time 8    Period Weeks    Status New    Target Date 06/04/21              04/30/21 1537  Plan  Clinical Impression Statement Today's skilled sesssion focused on standing frame trial with representative from [employer]. No issues noted with standing in the frame this session. Pt would benefit from use of standing frame at home for bone density, stretching of muscles/joints and pressure relief for skin integreity. Will plan to continue with use of RGO next session.  Personal Factors and Comorbidities Comorbidity 3+;Past/Current Experience;Transportation  Comorbidities paraplegia, colostomy, spasticity, neurogenic bowel and bladder, chronic pain, pressure ulcers  Examination-Activity Limitations Locomotion Level;Stand  Examination-Participation Restrictions Community Activity;Driving  Pt will benefit from skilled therapeutic intervention in order to improve on the following deficits Abnormal gait;Decreased balance;Decreased strength;Difficulty walking;Impaired sensation;Impaired tone  Rehab Potential Good  PT Frequency 1x / week  PT Duration 8 weeks  PT Treatment/Interventions ADLs/Self Care Home Management;DME Instruction;Gait training;Functional mobility training;Therapeutic activities;Therapeutic exercise;Balance training;Neuromuscular  re-education;Patient/family education;Orthotic Fit/Training  PT Next Visit Plan RGO donning, sit <> stand training, gait.  Consulted and Agree with Plan of Care Patient;Family member/caregiver  Family Member Consulted Mother Frederik Pear         Plan - 04/30/21 1537     Personal Factors  and Comorbidities Comorbidity 3+;Past/Current Experience;Transportation    Comorbidities paraplegia, colostomy, spasticity, neurogenic bowel and bladder, chronic pain, pressure ulcers    Examination-Activity Limitations Locomotion Level;Stand    Examination-Participation Restrictions Community Activity;Driving    Rehab Potential Good    PT Frequency 1x / week    PT Duration 8 weeks    PT Treatment/Interventions ADLs/Self Care Home Management;DME Instruction;Gait training;Functional mobility training;Therapeutic activities;Therapeutic exercise;Balance training;Neuromuscular re-education;Patient/family education;Orthotic Fit/Training    PT Next Visit Plan RGO donning, sit <> stand training, gait.  Trial standing frame with [employer] is scheduled for 04/30/21 session.    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

## 2021-05-05 ENCOUNTER — Ambulatory Visit: Payer: BLUE CROSS/BLUE SHIELD | Admitting: Physical Therapy

## 2021-05-05 ENCOUNTER — Other Ambulatory Visit: Payer: Self-pay

## 2021-05-05 DIAGNOSIS — R2689 Other abnormalities of gait and mobility: Secondary | ICD-10-CM

## 2021-05-05 DIAGNOSIS — G8221 Paraplegia, complete: Secondary | ICD-10-CM

## 2021-05-05 DIAGNOSIS — M6281 Muscle weakness (generalized): Secondary | ICD-10-CM

## 2021-05-05 DIAGNOSIS — R29818 Other symptoms and signs involving the nervous system: Secondary | ICD-10-CM

## 2021-05-05 DIAGNOSIS — R208 Other disturbances of skin sensation: Secondary | ICD-10-CM

## 2021-05-05 DIAGNOSIS — R2681 Unsteadiness on feet: Secondary | ICD-10-CM

## 2021-05-06 NOTE — Therapy (Signed)
[facility] [address] [place], [place], [address] Phone: [phone]   Fax:  [phone]  Physical Therapy Treatment  Patient Details  Name: Luke Velasquez MRN: [id number] Date of Birth: June 06, 2001 Referring Provider (PT): Curlene Dolphin, MD (Physician in [place]; care has been transferred to Genice Rouge, MD   Encounter Date: 05/05/2021   PT End of Session - 05/06/21 1119     Visit Number 32    Number of Visits 36    Date for PT Re-Evaluation 06/04/21    Authorization Type BCBS    Authorization Time Period VL: 60 PT/OT/SLP combined    Authorization - Visit Number 19   for the new year 2022   Authorization - Number of Visits 60    PT Start Time 1324    PT Stop Time 1400    PT Time Calculation (min) 36 min    Equipment Utilized During Treatment Other (comment)   RGO   Activity Tolerance Patient tolerated treatment well    Behavior During Therapy [facility] for tasks assessed/performed             No past medical history on file.  No past surgical history on file.  There were no vitals filed for this visit.   Subjective Assessment - 05/05/21 1327     Subjective Doing well, some middle back pain.  Decided to go with Melissa Noon with [employer].    Pertinent History paraplegia, colostomy, spasticity, neurogenic bowel and bladder, chronic pain, pressure ulcers    Patient Stated Goals To be able to walk around the house with KAFO's.  Pt owns a standard walker.    Currently in Pain? Yes             Reviewed possible method for pt to don RGO more independently at home: reviewed how to flex hip joint and ABD hip joint of RGO to allow pt to sit edge of bed/mat and don LE portion.  Pt's R hip continues to catch on the side of the back brace and prevents the RLE from being fully seated in brace.  When attempting to reposition hips more to the L, unable due to scoliosis.  PT to contact orthotist about adjusting  back brace again.  Provided pt with prop for foot and pt able to don L shoe with supervision, 25% assistance needed for R shoe due to hip rubbing on back brace.    Performed sit > stand from elevated mat with RW and supervision with cues to prepare hip lock and then how to engage knee locks and hip lock once in standing.  Performed gait training around gym x 2 (230') with therapist behind pt providing min-mod A overall for weight shift and trunk extension to initiate hip flexion.  Pt noted to be using UE less to lift body or hop forwards and pt reported less fatigue in UE, more fatigue in LE.  Pt reports mild rubbing of R hip against back brace while ambulating.    Returned to mat and pt required min A to disengage hip on each side to prepare for stand > sit; also cued pt how to back up to mat and use mat to disengage knee lock for flexion moment when ready to sit.  Once seated and out of brace, assessed R hip for shear or friction, none noted.       PT Education - 05/06/21 1117     Education Details Donning RGO seated edge of mat, will  reach out to orthotist about fit of back brace    Person(s) Educated Patient;Parent(s)    Methods Explanation    Comprehension Verbalized understanding;Need further instruction;Returned demonstration              PT Short Term Goals - 07/31/20 1625       PT SHORT TERM GOAL #1   Title = LTG               PT Long Term Goals - 04/05/21 1748       PT LONG TERM GOAL #1   Title Pt will demonstrate ability to set up and don RGO with min A    Baseline max-total A    Time 8    Period Weeks    Status On-going    Target Date 06/04/21      PT LONG TERM GOAL #2   Title Pt will engage hip lock and perform sit <> stand from elevated mat with RW and CGA    Baseline Min A to stand; min-mod A to engage knee extension lock    Time 8    Period Weeks    Status On-going    Target Date 06/04/21      PT LONG TERM GOAL #3   Title Pt will perform gait x  230' over indoor surfaces with RW, RGO and CGA    Baseline +2 115' with RW    Time 8    Period Weeks    Status On-going    Target Date 06/04/21      PT LONG TERM GOAL #4   Title Pt will be able to maintain balance and fully disengage both hips and one knee to sit with improved control (with one knee in extension) with CGA.    Baseline +2 assist    Time 8    Period Weeks    Status On-going    Target Date 06/04/21      PT LONG TERM GOAL #5   Title Pt will participate in assessment for standing frame with w/c vendor to assist with pressure relief and joint ROM    Time 8    Period Weeks    Status New    Target Date 06/04/21                   Plan - 05/06/21 1120     Clinical Impression Statement Returned to training with RGO including education and training for how to don more independently.  Pt demonstrated progress with ability to perform sit > stand with min A but continues to require min cues and assistance to lock knees in extension and to engage hip extension.  Pt demonstrated progress with gait today and was able to ambulate x 230' with one person min-mod A with improved weight shifting and activation of hip flexion.  Continues to require min-mod A for safe stand > sit.    Personal Factors and Comorbidities Comorbidity 3+;Past/Current Experience;Transportation    Comorbidities paraplegia, colostomy, spasticity, neurogenic bowel and bladder, chronic pain, pressure ulcers    Examination-Activity Limitations Locomotion Level;Stand    Examination-Participation Restrictions Community Activity;Driving    Rehab Potential Good    PT Frequency 1x / week    PT Duration 8 weeks    PT Treatment/Interventions ADLs/Self Care Home Management;DME Instruction;Gait training;Functional mobility training;Therapeutic activities;Therapeutic exercise;Balance training;Neuromuscular re-education;Patient/family education;Orthotic Fit/Training    PT Next Visit Plan Jeff flare R edge of hip.  RGO  donning edge of mat, sit <> stand  training, gait - one person assist now - use heel wedges under AFO to improve WB and initiation of swing phase.    Consulted and Agree with Plan of Care Patient;Family member/caregiver    Family Member Consulted Mother - [name]             Patient will benefit from skilled therapeutic intervention in order to improve the following deficits and impairments:  Abnormal gait, Decreased balance, Decreased strength, Difficulty walking, Impaired sensation, Impaired tone  Visit Diagnosis: Paraplegia, complete (HCC)  Muscle weakness (generalized)  Unsteadiness on feet  Other abnormalities of gait and mobility  Other symptoms and signs involving the nervous system  Other disturbances of skin sensation     Problem List Patient Active Problem List   Diagnosis Date Noted   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 [place], [place], [address] Phone: [phone]   Fax:  [phone]  Name: Luke Velasquez MRN: [id number] Date of Birth: Jan 30, 2001

## 2021-05-22 ENCOUNTER — Ambulatory Visit: Payer: BLUE CROSS/BLUE SHIELD | Attending: Physical Medicine and Rehabilitation | Admitting: Physical Therapy

## 2021-05-22 ENCOUNTER — Other Ambulatory Visit: Payer: Self-pay

## 2021-05-22 DIAGNOSIS — M6281 Muscle weakness (generalized): Secondary | ICD-10-CM | POA: Diagnosis present

## 2021-05-22 DIAGNOSIS — R29818 Other symptoms and signs involving the nervous system: Secondary | ICD-10-CM | POA: Diagnosis present

## 2021-05-22 DIAGNOSIS — G8221 Paraplegia, complete: Secondary | ICD-10-CM | POA: Insufficient documentation

## 2021-05-22 DIAGNOSIS — R2689 Other abnormalities of gait and mobility: Secondary | ICD-10-CM | POA: Diagnosis present

## 2021-05-22 DIAGNOSIS — R2681 Unsteadiness on feet: Secondary | ICD-10-CM | POA: Diagnosis present

## 2021-05-22 DIAGNOSIS — R208 Other disturbances of skin sensation: Secondary | ICD-10-CM | POA: Insufficient documentation

## 2021-05-22 NOTE — Therapy (Signed)
[facility] [address] [place], [place], [address] Phone: [phone]   Fax:  [phone]  Physical Therapy Treatment  Patient Details  Name: Luke Velasquez MRN: [id number] Date of Birth: July 26, 2000 Referring Provider (PT): Curlene Dolphin, MD (Physician in [place]; care has been transferred to Genice Rouge, MD   Encounter Date: 05/22/2021   PT End of Session - 05/22/21 2202     Visit Number 33    Number of Visits 36    Date for PT Re-Evaluation 06/04/21    Authorization Type BCBS    Authorization Time Period VL: 60 PT/OT/SLP combined    Authorization - Visit Number 20   for the new year 2022   Authorization - Number of Visits 60    PT Start Time 1450    PT Stop Time 1530    PT Time Calculation (min) 40 min    Equipment Utilized During Treatment Other (comment)   RGO   Activity Tolerance Patient tolerated treatment well    Behavior During Therapy [facility] for tasks assessed/performed             No past medical history on file.  No past surgical history on file.  There were no vitals filed for this visit.   Subjective Assessment - 05/22/21 2156     Subjective Orthotist revised back brace by removing hard plastic to avoid rubbing against hip.  Pt asking about LMN for standing frame; alerted pt that LMN had been written and sent to [facility].    Pertinent History paraplegia, colostomy, spasticity, neurogenic bowel and bladder, chronic pain, pressure ulcers    Patient Stated Goals To be able to walk around the house with KAFO's.  Pt owns a standard walker.    Currently in Pain? No/denies            Continued to review how to set up RGO for donning (unlock hips and knees into flexion and how to unlock hips into ABD).  Discussed placing RGO at corner of bed/mat with one LE ABD 90 deg and then transfer from w/c to in between legs, don KAFO, shoes and then secure back brace.  Added heel wedge under AFO to tip tibia  forwards and improve anterior weight shift through stance LE.  Pt required assistance today to secure tibia in AFO and don shoes.  Cued pt to engage hip locks prior to standing.  Performed sit > stand from elevated mat with RW with supervision but once standing required assistance to fully lock out knees.   Performed gait around gym x 230' with RW and one person assist beginning with min guard for balance; intermittent min A for facilitation of weight shift and trunk extension as pt fatigued and began to rely more on UE to lift and advance each LE.  Pt demonstrated improved activation of hip flexion and LE advancement and demonstrated improved foot clearance and stride length.  Required two standing rest breaks to rest UE.    When preparing for stand > sit pt able to unlock hip hinge but required therapist assistance to unlock knees.  Assisted pt with doffing shoes and RGO.       PT Education - 05/22/21 2158     Education Details adjustments made to back brace, ongoing training for how to don RGO; rationale for using heel wedges under AFO    Person(s) Educated Patient    Methods Explanation;Demonstration    Comprehension Verbalized understanding;Returned demonstration  PT Short Term Goals - 07/31/20 1625       PT SHORT TERM GOAL #1   Title = LTG               PT Long Term Goals - 04/05/21 1748       PT LONG TERM GOAL #1   Title Pt will demonstrate ability to set up and don RGO with min A    Baseline max-total A    Time 8    Period Weeks    Status On-going    Target Date 06/04/21      PT LONG TERM GOAL #2   Title Pt will engage hip lock and perform sit <> stand from elevated mat with RW and CGA    Baseline Min A to stand; min-mod A to engage knee extension lock    Time 8    Period Weeks    Status On-going    Target Date 06/04/21      PT LONG TERM GOAL #3   Title Pt will perform gait x 230' over indoor surfaces with RW, RGO and CGA    Baseline +2 115'  with RW    Time 8    Period Weeks    Status On-going    Target Date 06/04/21      PT LONG TERM GOAL #4   Title Pt will be able to maintain balance and fully disengage both hips and one knee to sit with improved control (with one knee in extension) with CGA.    Baseline +2 assist    Time 8    Period Weeks    Status On-going    Target Date 06/04/21      PT LONG TERM GOAL #5   Title Pt will participate in assessment for standing frame with w/c vendor to assist with pressure relief and joint ROM    Time 8    Period Weeks    Status New    Target Date 06/04/21                   Plan - 05/22/21 2202     Clinical Impression Statement Pt demonstrates improved fit in back brace today with recent adjustments; continued to review ways to set up RGO for more independent donning and doffing and use of heel wedges under AFO to assist wtih weight shift forwards.  Pt required decreased assistance today to weight shift and initiate LE flexion/advancement; pt did report increased UE and shoulder tension during second lap and required two standing rest breaks to rest UE.  Still requires assistance during sit <> stand and cues to engage hip lock and to fully lock and unlock knees safely.  Will continue to address and progress towards greater independence.    Personal Factors and Comorbidities Comorbidity 3+;Past/Current Experience;Transportation    Comorbidities paraplegia, colostomy, spasticity, neurogenic bowel and bladder, chronic pain, pressure ulcers    Examination-Activity Limitations Locomotion Level;Stand    Examination-Participation Restrictions Community Activity;Driving    Rehab Potential Good    PT Frequency 1x / week    PT Duration 8 weeks    PT Treatment/Interventions ADLs/Self Care Home Management;DME Instruction;Gait training;Functional mobility training;Therapeutic activities;Therapeutic exercise;Balance training;Neuromuscular re-education;Patient/family education;Orthotic  Fit/Training    PT Next Visit Plan RGO donning edge of mat - have one LE ABD 90 deg off corner edge of mat - transfer onto mat between LE, sit <> stand training, gait - one person assist now - use heel wedges under AFO to  improve WB and initiation of swing phase.    Consulted and Agree with Plan of Care Patient;Family member/caregiver    Family Member Consulted Mother - [name]             Patient will benefit from skilled therapeutic intervention in order to improve the following deficits and impairments:  Abnormal gait, Decreased balance, Decreased strength, Difficulty walking, Impaired sensation, Impaired tone  Visit Diagnosis: Paraplegia, complete (HCC)  Muscle weakness (generalized)  Unsteadiness on feet  Other abnormalities of gait and mobility  Other symptoms and signs involving the nervous system  Other disturbances of skin sensation     Problem List Patient Active Problem List   Diagnosis Date Noted   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 [place], [place], [address] Phone: [phone]   Fax:  [phone]  Name: Rayshard Schirtzinger MRN: [id number] Date of Birth: [birth year]

## 2021-05-25 ENCOUNTER — Other Ambulatory Visit: Payer: Self-pay

## 2021-05-25 ENCOUNTER — Ambulatory Visit: Payer: BLUE CROSS/BLUE SHIELD | Admitting: Physical Therapy

## 2021-05-25 DIAGNOSIS — R208 Other disturbances of skin sensation: Secondary | ICD-10-CM

## 2021-05-25 DIAGNOSIS — R2689 Other abnormalities of gait and mobility: Secondary | ICD-10-CM

## 2021-05-25 DIAGNOSIS — G8221 Paraplegia, complete: Secondary | ICD-10-CM

## 2021-05-25 DIAGNOSIS — R29818 Other symptoms and signs involving the nervous system: Secondary | ICD-10-CM

## 2021-05-25 DIAGNOSIS — M6281 Muscle weakness (generalized): Secondary | ICD-10-CM

## 2021-05-25 DIAGNOSIS — R2681 Unsteadiness on feet: Secondary | ICD-10-CM

## 2021-05-25 NOTE — Therapy (Signed)
[facility] [address] [place], [place], [address] Phone: [phone]   Fax:  [phone]  Physical Therapy Treatment  Patient Details  Name: Luke Velasquez MRN: [id number] Date of Birth: [birth year] Referring Provider (PT): Curlene Dolphin, MD (Physician in [place]; care has been transferred to Genice Rouge, MD   Encounter Date: 05/25/2021   PT End of Session - 05/25/21 2029     Visit Number 34    Number of Visits 36    Date for PT Re-Evaluation 06/04/21    Authorization Type BCBS    Authorization Time Period VL: 60 PT/OT/SLP combined    Authorization - Visit Number 21   for the new year 2022   Authorization - Number of Visits 60    PT Start Time 1615    PT Stop Time 1700    PT Time Calculation (min) 45 min    Equipment Utilized During Treatment Other (comment)   RGO   Activity Tolerance Patient limited by pain    Behavior During Therapy [facility] for tasks assessed/performed             No past medical history on file.  No past surgical history on file.  There were no vitals filed for this visit.   Subjective Assessment - 05/25/21 2027     Subjective Nothing new to report.  Reports bed at home is really tall.    Pertinent History paraplegia, colostomy, spasticity, neurogenic bowel and bladder, chronic pain, pressure ulcers    Patient Stated Goals To be able to walk around the house with KAFO's.  Pt owns a standard walker.    Currently in Pain? No/denies            Raised mat to height of bed at home; pt able to abduct each leg of RGO and transfer uphill to the mat.  Donned RLE but due to feet not touching floor, not able to fully seat LE in KAFO.  Discussed other options for donning RGO; pt states he could use the couch.  Lowered mat for feet to touch and continued to don KAFO portion.  Discussed ways for pt to don shoes; attempted flexing forward in long sitting with LE extended on chair in front;  changed to half long sitting - pt able to don shoes with intermittent min A to raise or lower LE.    Performed sit > stand from lower mat with supervision but required min-mod A to engage hips once standing and to fully lock out bilat knees.  Performed gait around gym x 230' with RGO and RW with pt initially requiring min guard for intermittent assistance to fully weight shift to R and cues for trunk extension.  During second lap pt required increased standing rest breaks due to pain in L shoulder with trigger point noted in L levator mm.  Also noted to be compensating with increased trunk flexion and lifting body with UE to swing LE through due to fatigue and shoulder pain.    Returned to mat and performed stand > sit with +2 assistance to unlock hips and knees with control.  After doffing RGO pt rested with moist heat applied to L shoulder until pain subsided for pt to perform transfer back to w/c.      PT Education - 05/25/21 2028     Education Details other methods for RGO set up and donning    Person(s) Educated Patient    Methods Explanation;Demonstration    Comprehension Need  further instruction              PT Short Term Goals - 07/31/20 1625       PT SHORT TERM GOAL #1   Title = LTG               PT Long Term Goals - 04/05/21 1748       PT LONG TERM GOAL #1   Title Pt will demonstrate ability to set up and don RGO with min A    Baseline max-total A    Time 8    Period Weeks    Status On-going    Target Date 06/04/21      PT LONG TERM GOAL #2   Title Pt will engage hip lock and perform sit <> stand from elevated mat with RW and CGA    Baseline Min A to stand; min-mod A to engage knee extension lock    Time 8    Period Weeks    Status On-going    Target Date 06/04/21      PT LONG TERM GOAL #3   Title Pt will perform gait x 230' over indoor surfaces with RW, RGO and CGA    Baseline +2 115' with RW    Time 8    Period Weeks    Status On-going    Target  Date 06/04/21      PT LONG TERM GOAL #4   Title Pt will be able to maintain balance and fully disengage both hips and one knee to sit with improved control (with one knee in extension) with CGA.    Baseline +2 assist    Time 8    Period Weeks    Status On-going    Target Date 06/04/21      PT LONG TERM GOAL #5   Title Pt will participate in assessment for standing frame with w/c vendor to assist with pressure relief and joint ROM    Time 8    Period Weeks    Status New    Target Date 06/04/21                   Plan - 05/25/21 2030     Clinical Impression Statement Continued to problem solve best way for pt to set up and don RGO at home; bed too tall and will likely don/doff on couch.  Pt reporting increased pain in L levator muscle today; required increased standing rest breaks during second lap.  Continues to require assistance for safe stand > sit.  Will continue to address in order to progress towards independent use of RGO at home.    Personal Factors and Comorbidities Comorbidity 3+;Past/Current Experience;Transportation    Comorbidities paraplegia, colostomy, spasticity, neurogenic bowel and bladder, chronic pain, pressure ulcers    Examination-Activity Limitations Locomotion Level;Stand    Examination-Participation Restrictions Community Activity;Driving    Rehab Potential Good    PT Frequency 1x / week    PT Duration 8 weeks    PT Treatment/Interventions ADLs/Self Care Home Management;DME Instruction;Gait training;Functional mobility training;Therapeutic activities;Therapeutic exercise;Balance training;Neuromuscular re-education;Patient/family education;Orthotic Fit/Training    PT Next Visit Plan Put heel wedges in each shoe under AFO to improve weight shift forwards; check LTG - send to me for recert.  Schedule 4-6 more visits.  Will likely don RGO on couch (bed too tall) sit RGO in middle of mat and fully ABD each LE; has to don shoes in half long sitting (one LE  extended on  a chair in front of him); continue to practice and problem solve independent sit <> stand.    Consulted and Agree with Plan of Care Patient;Family member/caregiver    Family Member Consulted Mother - [name]             Patient will benefit from skilled therapeutic intervention in order to improve the following deficits and impairments:  Abnormal gait, Decreased balance, Decreased strength, Difficulty walking, Impaired sensation, Impaired tone  Visit Diagnosis: Paraplegia, complete (HCC)  Muscle weakness (generalized)  Unsteadiness on feet  Other abnormalities of gait and mobility  Other symptoms and signs involving the nervous system  Other disturbances of skin sensation     Problem List Patient Active Problem List   Diagnosis Date Noted   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 [place], [place], [address] Phone: [phone]   Fax:  [phone]  Name: Luke Velasquez MRN: [id number] Date of Birth: 2000/09/02

## 2021-06-02 ENCOUNTER — Other Ambulatory Visit: Payer: Self-pay

## 2021-06-02 ENCOUNTER — Encounter: Payer: Self-pay | Admitting: Physical Therapy

## 2021-06-02 ENCOUNTER — Ambulatory Visit: Payer: BLUE CROSS/BLUE SHIELD | Admitting: Physical Therapy

## 2021-06-02 DIAGNOSIS — M6281 Muscle weakness (generalized): Secondary | ICD-10-CM

## 2021-06-02 DIAGNOSIS — R2681 Unsteadiness on feet: Secondary | ICD-10-CM

## 2021-06-02 DIAGNOSIS — G8221 Paraplegia, complete: Secondary | ICD-10-CM | POA: Diagnosis not present

## 2021-06-02 DIAGNOSIS — R2689 Other abnormalities of gait and mobility: Secondary | ICD-10-CM

## 2021-06-02 DIAGNOSIS — R29818 Other symptoms and signs involving the nervous system: Secondary | ICD-10-CM

## 2021-06-02 DIAGNOSIS — R208 Other disturbances of skin sensation: Secondary | ICD-10-CM

## 2021-06-03 NOTE — Therapy (Addendum)
[facility] [address] [place], [place], [address] Phone: [phone]   Fax:  [phone]  Physical Therapy Treatment  Patient Details  Name: Luke Velasquez MRN: [id number] Date of Birth: 03-Feb-2001 Referring Provider (PT): Glennie Isle, MD (Physician in [place]; care has been transferred to Courtney Heys, MD   Encounter Date: 06/02/2021   PT End of Session - 06/02/21 1546     Visit Number 35    Number of Visits 36    Date for PT Re-Evaluation 06/04/21    Authorization Type BCBS    Authorization Time Period VL: 60 PT/OT/SLP combined    Authorization - Visit Number 22   for the new year 2022   Authorization - Number of Visits 60    PT Start Time 2902   pt late for appt   PT Stop Time 1622    PT Time Calculation (min) 38 min    Equipment Utilized During Treatment Other (comment)   RGO   Activity Tolerance Patient limited by pain    Behavior During Therapy [facility] for tasks assessed/performed             History reviewed. No pertinent past medical history.  History reviewed. No pertinent surgical history.  There were no vitals filed for this visit.   Subjective Assessment - 06/02/21 1545     Subjective No new complaints. No falls. No pain, some soreness in back rib from using therabands at home.    Patient is accompained by: Family member   mom   Pertinent History paraplegia, colostomy, spasticity, neurogenic bowel and bladder, chronic pain, pressure ulcers    Patient Stated Goals To be able to walk around the house with KAFO's.  Pt owns a standard walker.    Currently in Pain? No/denies    Pain Score 0-No pain                     [facility] Adult PT Treatment/Exercise - 06/02/21 1549       Transfers   Transfers Sit to Stand;Stand to Sit;Lateral/Scoot Transfers    Sit to Stand 3: Mod assist;From elevated surface;With upper extremity assist;From bed    Sit to Stand Details (indicate cue type  and reason) seated edge if mat with left LE locked into extension. bil hands on RW to stand with assist to engage other LE and bil hip locks    Stand to Sit 3: Mod assist;With upper extremity assist;To bed    Stand to Sit Details once at edge of mat after gait, pt able to self unlock bil hip locks, however needed assist to unlock bil knee components with assit for controlled descent.    Lateral/Scoot Transfers 6: Modified independent (Device/Increase time)    Lateral/Scoot Transfer Details (indicate cue type and reason) wheelchair<>mat table      Ambulation/Gait   Ambulation/Gait Yes    Ambulation/Gait Assistance 4: Min assist;3: Mod assist   2cd person stand by for safety   Ambulation Distance (Feet) 230 Feet   x1   Assistive device Rolling walker;Other (Comment)    Gait Pattern Step-through pattern;Trunk flexed;Poor foot clearance - left;Poor foot clearance - right    Ambulation Surface Level;Indoor      Therapeutic Activites    Therapeutic Activities Other Therapeutic Activities    Other Therapeutic Activities at edge of mat continued to work on pt self donning RGO. with bil LE's in abduction assit needed to place trunk component behind pt's back. Pt then  able to bring bil LE components in so to place LE's into them with min assit. Pt able to self tighten thigh straps with assit needed for lower limb straps. Attempted to have pt set one leg out in extension for modified long sitting to don shoes, however pt having issues with holding balance and getting shoes over toes of foot. PTA ended up assisting with donning bil shoes.                       PT Short Term Goals - 07/31/20 1625       PT SHORT TERM GOAL #1   Title = LTG               PT Long Term Goals - 06/02/21 1548       PT LONG TERM GOAL #1   Title Pt will demonstrate ability to set up and don RGO with min A    Baseline 06/02/21: min to mod assist to set up and don RGO at edge of mat    Time --    Period  --    Status Partially Met      PT LONG TERM GOAL #2   Title Pt will engage hip lock and perform sit <> stand from elevated mat with RW and CGA    Baseline 06/02/21: min assist needed to lock 2cd knee locks once in standing with min guard to min assist to stand mat to RW    Time --    Period --    Status Partially Met      PT LONG TERM GOAL #3   Title Pt will perform gait x 230' over indoor surfaces with RW, RGO and CGA    Baseline 06/02/21: met the distance portion of goal, continues to need up to min/mod assist    Time --    Period --    Status Partially Met      PT LONG TERM GOAL #4   Title Pt will be able to maintain balance and fully disengage both hips and one knee to sit with improved control (with one knee in extension) with CGA.    Baseline 06/02/21: up to mod assist needed for pt to self unlock hip locks in standing, then min/mod assist to unlock knees to sit safely to mat surface, was +2 assist    Time --    Period --    Status Partially Met      PT LONG TERM GOAL #5   Title Pt will participate in assessment for standing frame with w/c vendor to assist with pressure relief and joint ROM    Baseline 06/02/21: has had consult with vendor. Just found out two days ago that insurance will not cover it.    Time 8    Status Achieved             New goals for recertification:  PT Long Term Goals - 06/10/21 1104       PT LONG TERM GOAL #1   Title Pt will demonstrate ability to set up and don RGO with supervision to simulate donning on couch at home    Baseline 06/02/21: min to mod assist to set up and don RGO at edge of mat    Time 8    Period Weeks    Status Revised    Target Date 08/26/21      PT LONG TERM GOAL #2   Title Pt will engage hip  lock and perform sit > stand from low mat with RW and supervision    Baseline 06/02/21: min assist needed to lock 2nd knee locks once in standing with min guard to min assist to stand mat to RW    Time 8    Period Weeks     Status Revised    Target Date 08/26/21      PT LONG TERM GOAL #3   Title Pt will perform gait x 115' over indoor surfaces with RW, RGO and supervision    Baseline 06/02/21: met the distance portion of goal, continues to need up to min/mod assist    Time 8    Period Weeks    Status Revised    Target Date 08/26/21      PT LONG TERM GOAL #4   Title Pt will be able to maintain balance and fully disengage both hips and one knee to perform stand > sit with increased control with supervision    Baseline 06/02/21: up to mod assist needed for pt to self unlock hip locks in standing, then min/mod assist to unlock knees to sit safely to mat surface, was +2 assist    Time 8    Period Weeks    Status Revised    Target Date 08/26/21            Rico Junker, PT, DPT 06/10/21    11:11 AM          Plan - 06/02/21 1547     Clinical Impression Statement Today's skilled session focused on progress toward LTGs for recert with goals partially to fully met this session. Did have one episode of right hip hinge unlocking during gait after locking mechanisim bumped into walker just right to press button needing mod/max assist for stability/balance while re-engaging hip lock. Otherwise pt demo'd great trunk shifting to facilitate weight shifitng to reciprocally advance LE's. The pt is making steady progress toward goals and should benefit from continued PT to progress toward unmet goals.    Personal Factors and Comorbidities Comorbidity 3+;Past/Current Experience;Transportation    Comorbidities paraplegia, colostomy, spasticity, neurogenic bowel and bladder, chronic pain, pressure ulcers    Examination-Activity Limitations Locomotion Level;Stand    Examination-Participation Restrictions Community Activity;Driving    Rehab Potential Good    PT Frequency 1x / week    PT Duration 8 weeks    PT Treatment/Interventions ADLs/Self Care Home Management;DME Instruction;Gait training;Functional mobility  training;Therapeutic activities;Therapeutic exercise;Balance training;Neuromuscular re-education;Patient/family education;Orthotic Fit/Training    PT Next Visit Plan Put heel wedges in each shoe under AFO to improve weight shift forwards; .  Will likely don RGO on couch (bed too tall) sit RGO in middle of mat and fully ABD each LE; has to don shoes in half long sitting (one LE extended on a chair in front of him)- unable to do this on 11/15 visist; continue to practice and problem solve independent sit <> stand.    Consulted and Agree with Plan of Care Patient;Family member/caregiver    Family Member Consulted Mother - [name]             Patient will benefit from skilled therapeutic intervention in order to improve the following deficits and impairments:  Abnormal gait, Decreased balance, Decreased strength, Difficulty walking, Impaired sensation, Impaired tone  Visit Diagnosis: Paraplegia, complete (HCC)  Muscle weakness (generalized)  Unsteadiness on feet  Other abnormalities of gait and mobility  Other symptoms and signs involving the nervous system  Other disturbances of skin sensation  Problem List Patient Active Problem List   Diagnosis Date Noted   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

## 2021-06-10 NOTE — Addendum Note (Signed)
Addended by: Bufford Lope F on: 06/10/2021 11:20 AM   Modules accepted: Orders

## 2021-06-10 NOTE — Addendum Note (Signed)
Addended by: Bufford Lope F on: 06/10/2021 11:29 AM   Modules accepted: Orders

## 2021-06-30 ENCOUNTER — Other Ambulatory Visit: Payer: Self-pay

## 2021-06-30 ENCOUNTER — Ambulatory Visit: Payer: BLUE CROSS/BLUE SHIELD | Attending: Physical Medicine and Rehabilitation | Admitting: Physical Therapy

## 2021-06-30 ENCOUNTER — Encounter: Payer: Self-pay | Admitting: Physical Therapy

## 2021-06-30 DIAGNOSIS — R2681 Unsteadiness on feet: Secondary | ICD-10-CM | POA: Diagnosis present

## 2021-06-30 DIAGNOSIS — R29818 Other symptoms and signs involving the nervous system: Secondary | ICD-10-CM | POA: Diagnosis present

## 2021-06-30 DIAGNOSIS — G8221 Paraplegia, complete: Secondary | ICD-10-CM | POA: Diagnosis present

## 2021-06-30 DIAGNOSIS — R2689 Other abnormalities of gait and mobility: Secondary | ICD-10-CM | POA: Insufficient documentation

## 2021-06-30 DIAGNOSIS — M6281 Muscle weakness (generalized): Secondary | ICD-10-CM | POA: Insufficient documentation

## 2021-07-01 NOTE — Therapy (Signed)
[facility] [address] [place], [place], [address] Phone: [phone]   Fax:  [phone]  Physical Therapy Treatment  Patient Details  Name: Luke Velasquez MRN: [id number] Date of Birth: 2001/03/27 Referring Provider (PT): Glennie Isle, MD (Physician in [place]; care has been transferred to Courtney Heys, MD   Encounter Date: 06/30/2021   PT End of Session - 06/30/21 1547     Visit Number 36    Number of Visits 43    Date for PT Re-Evaluation 08/26/21    Authorization Type BCBS    Authorization Time Period VL: 60 PT/OT/SLP combined    Authorization - Visit Number 23   for the new year 2022   Authorization - Number of Visits 60    PT Start Time 4270   pt late for session today   PT Stop Time 1625    PT Time Calculation (min) 42 min    Equipment Utilized During Treatment Other (comment)   RGO   Activity Tolerance Patient tolerated treatment well;Patient limited by fatigue    Behavior During Therapy [facility] for tasks assessed/performed             History reviewed. No pertinent past medical history.  History reviewed. No pertinent surgical history.  There were no vitals filed for this visit.   Subjective Assessment - 06/30/21 1546     Subjective No new complaints. No falls. Was started on a blood thinner 2 days ago. Will bring in name and dosage next session.    Pertinent History paraplegia, colostomy, spasticity, neurogenic bowel and bladder, chronic pain, pressure ulcers    Patient Stated Goals To be able to walk around the house with KAFO's.  Pt owns a standard walker.    Currently in Pain? No/denies                    [facility] Adult PT Treatment/Exercise - 06/30/21 1559       Transfers   Transfers Sit to Stand;Stand to Sit;Lateral/Scoot Transfers    Sit to Stand 3: Mod assist;From elevated surface;With upper extremity assist;From bed    Sit to Stand Details (indicate cue type and reason)  seated edge if mat with left LE locked into extension. bil hands on RW to stand with assist to engage other LE and bil hip locks    Stand to Sit 2: Max assist;With upper extremity assist;To bed   with 2cd person to assist with LE management   Stand to Sit Details once at edge of mat after gait, pt able to self unlock bil hip locks, however needed assist to unlock bil knee components with assit for controlled descent.      Ambulation/Gait   Ambulation/Gait Yes    Ambulation/Gait Assistance 4: Min assist;3: Mod assist   2cd person standby in gym   Ambulation/Gait Assistance Details increased assistance as gait distance progressed for balance and weight shifting. one episode of left hip lock disengaging due to pt's hand pressing against button during gait with max assist to reengage lock. several stops needed to re-set gait pattern/weight shifting.    Ambulation Distance (Feet) 230 Feet    Assistive device Rolling walker;Other (Comment)   RGO   Gait Pattern Step-through pattern;Trunk flexed;Poor foot clearance - left;Poor foot clearance - right    Ambulation Surface Level;Indoor      Therapeutic Activites    Therapeutic Activities Other Therapeutic Activities    Other Therapeutic Activities at edge of mat continued to  work on pt Photographer. with bil LE's in abduction assit needed to place trunk component behind pt's back. Pt then able to bring right sided component in and place leg in. pt able to fasten all straps. then pt was able to prop right foot on chair seat placed in front of him (with LE in hip/knee flexion) to don shoe with min assist. then pt brought in left side and donned it and the left shoe in the same way with min assist. pt then needed min assist to get trunk component in correct place and strap fully tightened.                  PT Short Term Goals - 07/31/20 1625       PT SHORT TERM GOAL #1   Title = LTG               PT Long Term Goals - 06/10/21 1104        PT LONG TERM GOAL #1   Title Pt will demonstrate ability to set up and don RGO with supervision to simulate donning on couch at home    Baseline 06/02/21: min to mod assist to set up and don RGO at edge of mat    Time 8    Period Weeks    Status Revised    Target Date 08/26/21      PT LONG TERM GOAL #2   Title Pt will engage hip lock and perform sit > stand from low mat with RW and supervision    Baseline 06/02/21: min assist needed to lock 2nd knee locks once in standing with min guard to min assist to stand mat to RW    Time 8    Period Weeks    Status Revised    Target Date 08/26/21      PT LONG TERM GOAL #3   Title Pt will perform gait x 115' over indoor surfaces with RW, RGO and supervision    Baseline 06/02/21: met the distance portion of goal, continues to need up to min/mod assist    Time 8    Period Weeks    Status Revised    Target Date 08/26/21      PT LONG TERM GOAL #4   Title Pt will be able to maintain balance and fully disengage both hips and one knee to perform stand > sit with increased control with supervision    Baseline 06/02/21: up to mod assist needed for pt to self unlock hip locks in standing, then min/mod assist to unlock knees to sit safely to mat surface, was +2 assist    Time 8    Period Weeks    Status Revised    Target Date 08/26/21                   Plan - 06/30/21 1547     Clinical Impression Statement Today's skilled session continued to address pt self donning/doffing RGO and transfers/gait with RGO. Increased assitance needed today with transfers/gait. Pt did report at end of session he was already tired and not feeling well prior to starting therapy today. The pt did need less assistance with donning RGO this session. The pt is making steady progress toward goals and should benefit from continued PT to progress toward unmet goals.    Personal Factors and Comorbidities Comorbidity 3+;Past/Current Experience;Transportation     Comorbidities paraplegia, colostomy, spasticity, neurogenic bowel and bladder, chronic pain, pressure ulcers  Examination-Activity Limitations Locomotion Level;Stand    Examination-Participation Restrictions Community Activity;Driving    Rehab Potential Good    PT Frequency 1x / week    PT Duration 8 weeks    PT Treatment/Interventions ADLs/Self Care Home Management;DME Instruction;Gait training;Functional mobility training;Therapeutic activities;Therapeutic exercise;Balance training;Neuromuscular re-education;Patient/family education;Orthotic Fit/Training    PT Next Visit Plan Put heel wedges in each shoe under AFO to improve weight shift forwards; .  Will likely don RGO on couch (bed too tall) sit RGO in middle of mat and fully ABD each LE; has to don shoes by propping foot on chair in front of him with hip/knee flexion, other foot on floor. continue to practice and problem solve independent sit <> stand.    Consulted and Agree with Plan of Care Patient;Family member/caregiver    Family Member Consulted Mother - [name]             Patient will benefit from skilled therapeutic intervention in order to improve the following deficits and impairments:  Abnormal gait, Decreased balance, Decreased strength, Difficulty walking, Impaired sensation, Impaired tone  Visit Diagnosis: Paraplegia, complete (HCC)  Muscle weakness (generalized)  Unsteadiness on feet  Other abnormalities of gait and mobility  Other symptoms and signs involving the nervous system     Problem List Patient Active Problem List   Diagnosis Date Noted   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

## 2021-07-09 ENCOUNTER — Ambulatory Visit: Payer: BLUE CROSS/BLUE SHIELD | Admitting: Physical Therapy

## 2021-07-13 ENCOUNTER — Other Ambulatory Visit: Payer: Self-pay

## 2021-07-13 ENCOUNTER — Emergency Department (HOSPITAL_COMMUNITY): Payer: BLUE CROSS/BLUE SHIELD

## 2021-07-13 ENCOUNTER — Emergency Department (HOSPITAL_COMMUNITY)
Admission: EM | Admit: 2021-07-13 | Discharge: 2021-07-14 | Disposition: A | Discharge status: Home / Self Care | Payer: BLUE CROSS/BLUE SHIELD | Attending: Emergency Medicine | Admitting: Emergency Medicine

## 2021-07-13 ENCOUNTER — Encounter (HOSPITAL_COMMUNITY): Payer: Self-pay | Admitting: *Deleted

## 2021-07-13 DIAGNOSIS — N50812 Left testicular pain: Secondary | ICD-10-CM | POA: Diagnosis not present

## 2021-07-13 DIAGNOSIS — Z5321 Procedure and treatment not carried out due to patient leaving prior to being seen by health care provider: Secondary | ICD-10-CM | POA: Insufficient documentation

## 2021-07-13 DIAGNOSIS — N492 Inflammatory disorders of scrotum: Secondary | ICD-10-CM | POA: Diagnosis not present

## 2021-07-13 HISTORY — DX: Acute embolism and thrombosis of unspecified deep veins of unspecified lower extremity: I82.409

## 2021-07-13 LAB — URINALYSIS, ROUTINE W REFLEX MICROSCOPIC
Bilirubin Urine: NEGATIVE
Glucose, UA: NEGATIVE mg/dL
Hgb urine dipstick: NEGATIVE
Ketones, ur: NEGATIVE mg/dL
Nitrite: POSITIVE — AB
Protein, ur: NEGATIVE mg/dL
Specific Gravity, Urine: 1.02 (ref 1.005–1.030)
pH: 7 (ref 5.0–8.0)

## 2021-07-13 LAB — CBC WITH DIFFERENTIAL/PLATELET
Abs Immature Granulocytes: 0.04 10*3/uL (ref 0.00–0.07)
Basophils Absolute: 0 10*3/uL (ref 0.0–0.1)
Basophils Relative: 0 %
Eosinophils Absolute: 0.2 10*3/uL (ref 0.0–0.5)
Eosinophils Relative: 3 %
HCT: 40.1 % (ref 39.0–52.0)
Hemoglobin: 12.5 g/dL — ABNORMAL LOW (ref 13.0–17.0)
Immature Granulocytes: 0 %
Lymphocytes Relative: 18 %
Lymphs Abs: 1.6 10*3/uL (ref 0.7–4.0)
MCH: 27.2 pg (ref 26.0–34.0)
MCHC: 31.2 g/dL (ref 30.0–36.0)
MCV: 87.2 fL (ref 80.0–100.0)
Monocytes Absolute: 0.6 10*3/uL (ref 0.1–1.0)
Monocytes Relative: 7 %
Neutro Abs: 6.7 10*3/uL (ref 1.7–7.7)
Neutrophils Relative %: 72 %
Platelets: 294 10*3/uL (ref 150–400)
RBC: 4.6 MIL/uL (ref 4.22–5.81)
RDW: 14 % (ref 11.5–15.5)
WBC: 9.2 10*3/uL (ref 4.0–10.5)
nRBC: 0 % (ref 0.0–0.2)

## 2021-07-13 LAB — BASIC METABOLIC PANEL
Anion gap: 6 (ref 5–15)
BUN: 12 mg/dL (ref 6–20)
CO2: 29 mmol/L (ref 22–32)
Calcium: 9.4 mg/dL (ref 8.9–10.3)
Chloride: 103 mmol/L (ref 98–111)
Creatinine, Ser: 0.67 mg/dL (ref 0.61–1.24)
GFR, Estimated: >60 mL/min (ref >60)
Glucose, Bld: 93 mg/dL (ref 70–99)
Potassium: 3.9 mmol/L (ref 3.5–5.1)
Sodium: 138 mmol/L (ref 135–145)

## 2021-07-13 LAB — URINALYSIS, MICROSCOPIC (REFLEX)

## 2021-07-13 NOTE — ED Provider Notes (Signed)
Emergency Medicine Provider Triage Evaluation Note  Luke Velasquez , a 20 y.o. male  was evaluated in triage.  Pt complains of left testicular swelling has been ongoing for the last 2 to 3 days.  He there is some moderate pain with it.  No penile discharge, dysuria, hematuria, frequency, urinary urgency.  Mother does state that his urine has been foul-smelling.  No fever or chills.  Review of Systems  Positive:  Negative: See above   Physical Exam  BP 103/74 (BP Location: Right Arm)    Pulse 78    Temp 98.6 F (37 C) (Oral)    Resp 14    SpO2 100%  Gen:   Awake, no distress   Resp:  Normal effort  MSK:   Moves extremities without difficulty  Other:  Left testicle is swollen but with normal lie.  There is tenderness to palpation over the testicle and epididymis.  Medical Decision Making  Medically screening exam initiated at 6:18 PM.  Appropriate orders placed.  Luke Velasquez was informed that the remainder of the evaluation will be completed by another provider, this initial triage assessment does not replace that evaluation, and the importance of remaining in the ED until their evaluation is complete.  Epididymitis/orchitis versus large varicocele.   Honor Loh [place], PA-C 07/13/21 1819    Eber Hong, MD 07/14/21 [phone]

## 2021-07-13 NOTE — ED Triage Notes (Signed)
Pt reports having left testicle swelling and discomfort x 2 days.

## 2021-07-14 NOTE — ED Notes (Signed)
Patient states she will follow up with doctor and is leaving

## 2021-07-16 ENCOUNTER — Ambulatory Visit: Payer: BLUE CROSS/BLUE SHIELD | Admitting: Physical Therapy

## 2021-07-21 ENCOUNTER — Other Ambulatory Visit: Payer: Self-pay

## 2021-07-21 ENCOUNTER — Encounter: Payer: Self-pay | Admitting: Physical Therapy

## 2021-07-21 ENCOUNTER — Ambulatory Visit: Payer: BLUE CROSS/BLUE SHIELD | Attending: Physical Medicine and Rehabilitation | Admitting: Physical Therapy

## 2021-07-21 DIAGNOSIS — R208 Other disturbances of skin sensation: Secondary | ICD-10-CM | POA: Insufficient documentation

## 2021-07-21 DIAGNOSIS — R29818 Other symptoms and signs involving the nervous system: Secondary | ICD-10-CM | POA: Insufficient documentation

## 2021-07-21 DIAGNOSIS — M6281 Muscle weakness (generalized): Secondary | ICD-10-CM | POA: Diagnosis present

## 2021-07-21 DIAGNOSIS — R2681 Unsteadiness on feet: Secondary | ICD-10-CM | POA: Diagnosis present

## 2021-07-21 DIAGNOSIS — G8221 Paraplegia, complete: Secondary | ICD-10-CM | POA: Diagnosis not present

## 2021-07-21 DIAGNOSIS — R2689 Other abnormalities of gait and mobility: Secondary | ICD-10-CM | POA: Insufficient documentation

## 2021-07-21 NOTE — Therapy (Signed)
[facility] [address] [place], [place], [address] Phone: [phone]   Fax:  [phone]  Physical Therapy Treatment  Patient Details  Name: Luke Velasquez MRN: [id number] Date of Birth: [birth year] Referring Provider (PT): Luke Isle, MD (Physician in [place]; care has been transferred to Luke Heys, MD   Encounter Date: 07/21/2021   PT End of Session - 07/21/21 1538     Visit Number 37    Number of Visits 43    Date for PT Re-Evaluation 08/26/21    Authorization Type BCBS    Authorization Time Period VL: 60 PT/OT/SLP combined    Authorization - Visit Number 1   for the new year 2023   Authorization - Number of Visits 60    PT Start Time 1533    PT Stop Time 1615    PT Time Calculation (min) 42 min    Equipment Utilized During Treatment Other (comment)   RGO   Activity Tolerance Patient tolerated treatment well;Patient limited by fatigue    Behavior During Therapy [facility] for tasks assessed/performed             Past Medical History:  Diagnosis Date   DVT (deep venous thrombosis) ([place])     History reviewed. No pertinent surgical history.  There were no vitals filed for this visit.   Subjective Assessment - 07/21/21 1537     Subjective No new complaints. No falls. Having some tenderness/soreness in left lower rib cage/lat muscles and left upper traps, mostly form having to use arms to transfer.    Pertinent History paraplegia, colostomy, spasticity, neurogenic bowel and bladder, chronic pain, pressure ulcers    Patient Stated Goals To be able to walk around the house with KAFO's.  Pt owns a standard walker.    Currently in Pain? No/denies    Pain Score 0-No pain                     [facility] Adult PT Treatment/Exercise - 07/21/21 1539       Transfers   Transfers Sit to Stand;Stand to Sit;Lateral/Scoot Transfers    Sit to Stand 3: Mod assist;From elevated surface;With upper extremity  assist;From bed    Sit to Stand Details (indicate cue type and reason) seated edge if mat with left LE locked into extension. bil hands on RW to stand with assist to engage other LE and bil hip locks    Stand to Sit 2: Max assist;With upper extremity assist;To bed    Stand to Sit Details once at edge of mat after gait, pt able to self unlock bil hip locks, however needed assist to unlock bil knees. attempted to move left LE forward and unlock right before sitting, however pt with uncontrolled descent with need to unlock other side as well before sitting on mat for safety.  will need to further problem solve sitting where no assist is needed for use in home.      Ambulation/Gait   Ambulation/Gait Yes    Ambulation/Gait Assistance 4: Min assist;3: Mod assist    Ambulation/Gait Assistance Details pt with good trunk/pelvic motion this session to self advance LE"s with gait.    Ambulation Distance (Feet) 230 Feet    Assistive device Rolling walker;Other (Comment)    Gait Pattern Step-through pattern;Trunk flexed;Poor foot clearance - left;Poor foot clearance - right    Ambulation Surface Level;Indoor      Therapeutic Activites    Therapeutic Activities Other Therapeutic Activities  Other Therapeutic Activities at edge of mat continued to work on pt self donning RGO. with bil LE's in abduction assit needed to place trunk component behind pt's back. Pt then able to bring right sided component in and place leg in. pt able to fasten all straps. then pt was able to prop right foot on chair seat placed in front of him (with LE in hip/knee flexion) to don shoe with min assist. then pt brought in left side and donned it and the left shoe in the same way with min assist. pt then needed min assist to get trunk component in correct place and strap fully tightened.                       PT Short Term Goals - 07/31/20 1625       PT SHORT TERM GOAL #1   Title = LTG               PT Long  Term Goals - 06/10/21 1104       PT LONG TERM GOAL #1   Title Pt will demonstrate ability to set up and don RGO with supervision to simulate donning on couch at home    Baseline 06/02/21: min to mod assist to set up and don RGO at edge of mat    Time 8    Period Weeks    Status Revised    Target Date 08/26/21      PT LONG TERM GOAL #2   Title Pt will engage hip lock and perform sit > stand from low mat with RW and supervision    Baseline 06/02/21: min assist needed to lock 2nd knee locks once in standing with min guard to min assist to stand mat to RW    Time 8    Period Weeks    Status Revised    Target Date 08/26/21      PT LONG TERM GOAL #3   Title Pt will perform gait x 115' over indoor surfaces with RW, RGO and supervision    Baseline 06/02/21: met the distance portion of goal, continues to need up to min/mod assist    Time 8    Period Weeks    Status Revised    Target Date 08/26/21      PT LONG TERM GOAL #4   Title Pt will be able to maintain balance and fully disengage both hips and one knee to perform stand > sit with increased control with supervision    Baseline 06/02/21: up to mod assist needed for pt to self unlock hip locks in standing, then min/mod assist to unlock knees to sit safely to mat surface, was +2 assist    Time 8    Period Weeks    Status Revised    Target Date 08/26/21                   Plan - 07/21/21 1539     Clinical Impression Statement Today's skilled session continued to focus on self donning/doffing of RGO at edge of mat.    Personal Factors and Comorbidities Comorbidity 3+;Past/Current Experience;Transportation    Comorbidities paraplegia, colostomy, spasticity, neurogenic bowel and bladder, chronic pain, pressure ulcers    Examination-Activity Limitations Locomotion Level;Stand    Examination-Participation Restrictions Community Activity;Driving    Rehab Potential Good    PT Frequency 1x / week    PT Duration 8 weeks    PT  Treatment/Interventions ADLs/Self Care  Home Management;DME Instruction;Gait training;Functional mobility training;Therapeutic activities;Therapeutic exercise;Balance training;Neuromuscular re-education;Patient/family education;Orthotic Fit/Training    PT Next Visit Plan Put heel wedges in each shoe under AFO to improve weight shift forwards; .  Will likely don RGO on couch (bed too tall) sit RGO in middle of mat and fully ABD each LE; has to don shoes by propping foot on chair in front of him with hip/knee flexion, other foot on floor. continue to practice and problem solve independent sit <> stand.    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 [address] [phone] 07/21/21, 4:26 PM   Name: Luke Velasquez MRN: [id number] Date of Birth: 07/28/2000

## 2021-07-28 ENCOUNTER — Ambulatory Visit: Payer: BLUE CROSS/BLUE SHIELD | Admitting: Physical Therapy

## 2021-07-28 ENCOUNTER — Other Ambulatory Visit: Payer: Self-pay

## 2021-07-28 ENCOUNTER — Encounter: Payer: Self-pay | Admitting: Physical Therapy

## 2021-07-28 DIAGNOSIS — R2681 Unsteadiness on feet: Secondary | ICD-10-CM

## 2021-07-28 DIAGNOSIS — R208 Other disturbances of skin sensation: Secondary | ICD-10-CM

## 2021-07-28 DIAGNOSIS — R29818 Other symptoms and signs involving the nervous system: Secondary | ICD-10-CM

## 2021-07-28 DIAGNOSIS — G8221 Paraplegia, complete: Secondary | ICD-10-CM

## 2021-07-28 DIAGNOSIS — R2689 Other abnormalities of gait and mobility: Secondary | ICD-10-CM

## 2021-07-28 DIAGNOSIS — M6281 Muscle weakness (generalized): Secondary | ICD-10-CM

## 2021-07-29 NOTE — Therapy (Signed)
[facility] [address] [place], [place], [address] Phone: [phone]   Fax:  [phone]  Physical Therapy Treatment  Patient Details  Name: Luke Velasquez MRN: [id number] Date of Birth: Mar 21, 2001 Referring Provider (PT): Glennie Isle, MD (Physician in [place]; care has been transferred to Courtney Heys, MD   Encounter Date: 07/28/2021   PT End of Session - 07/29/21 1632     Visit Number 38    Number of Visits 43    Date for PT Re-Evaluation 08/26/21    Authorization Type BCBS    Authorization Time Period VL: 60 PT/OT/SLP combined    Authorization - Visit Number 2   for the new year 2023   Authorization - Number of Visits 60    PT Start Time 1535    PT Stop Time 1615    PT Time Calculation (min) 40 min    Equipment Utilized During Treatment Other (comment)   RGO   Activity Tolerance Patient tolerated treatment well    Behavior During Therapy WFL for tasks assessed/performed             Past Medical History:  Diagnosis Date   DVT (deep venous thrombosis) ([place])     History reviewed. No pertinent surgical history.  There were no vitals filed for this visit.   Subjective Assessment - 07/29/21 1615     Subjective Upper trap continues to be sore but has been doing stretches for it.  No other issues to report.    Pertinent History paraplegia, colostomy, spasticity, neurogenic bowel and bladder, chronic pain, pressure ulcers    Patient Stated Goals To be able to walk around the house with KAFO's.  Pt owns a standard walker.    Currently in Pain? No/denies              [facility] Adult PT Treatment/Exercise - 07/29/21 1618       Transfers   Transfers Sit to Stand;Stand to Sit    Sit to Stand 4: Min assist;3: Mod assist    Sit to Stand Details (indicate cue type and reason) from low mat to simulate standing from couch; pt requires min A initially to stand but mod A once standing for  balance/stability, to prevent fall until hips and knees lock into extension.  Intermittent assistance to lock 1 knee or cues to lock hips    Stand to Sit 2: Max assist    Stand to Sit Details significant time spent today practicing 1 technique for stand > sit.  Focused on sequencing of unlocking hips for flexion, moving hand to middle of RW, placing L foot forwards, and then bending down to unlock one knee while sliding other LE forwards to sit.  With practice pt able to hinge at the hips to reach R knee to unlock but required increased assistance to advance LLE forwards to complete transition to sitting.  Each time L knee would unlock when handle engaged by edge of mat.      Ambulation/Gait   Ambulation/Gait Yes    Ambulation/Gait Assistance 4: Min guard;4: Min assist    Ambulation/Gait Assistance Details Continues to require intermittent verbal cues for weight shifting and trunk extension to advance each LE; at end of lap pt reporting pain in posterior LLE causing pt to utilize UE on RW more.  PT provided intermittent assistance to complete full anterior/lateral weight shift.    Ambulation Distance (Feet) 115 Feet    Assistive device Rolling walker;Other (Comment)  RGO   Gait Pattern Step-through pattern;Poor foot clearance - left;Poor foot clearance - right    Ambulation Surface Level;Indoor      Therapeutic Activites    Therapeutic Activities Other Therapeutic Activities    Other Therapeutic Activities at edge of mat continued to work on pt self donning RGO. Continued to ABD bilat hip hinges to allow pt to transfer from w/c > mat and begin to position self in back brace.  Pt able to bring right and left sided components in and place leg in and fasten all straps. For shoe donning pt continues to to prop each foot on chair seat placed in front of him (with LE in hip/knee flexion) to don shoe with min assist. Pt demonstrated increased independence with donning trunk component in correct place and  strap fully tightened.                     PT Education - 07/29/21 1631     Education Details sit <> stand training    Person(s) Educated Patient    Methods Explanation    Comprehension Verbalized understanding              PT Short Term Goals - 07/31/20 1625       PT SHORT TERM GOAL #1   Title = LTG               PT Long Term Goals - 06/10/21 1104       PT LONG TERM GOAL #1   Title Pt will demonstrate ability to set up and don RGO with supervision to simulate donning on couch at home    Baseline 06/02/21: min to mod assist to set up and don RGO at edge of mat    Time 8    Period Weeks    Status Revised    Target Date 08/26/21      PT LONG TERM GOAL #2   Title Pt will engage hip lock and perform sit > stand from low mat with RW and supervision    Baseline 06/02/21: min assist needed to lock 2nd knee locks once in standing with min guard to min assist to stand mat to RW    Time 8    Period Weeks    Status Revised    Target Date 08/26/21      PT LONG TERM GOAL #3   Title Pt will perform gait x 115' over indoor surfaces with RW, RGO and supervision    Baseline 06/02/21: met the distance portion of goal, continues to need up to min/mod assist    Time 8    Period Weeks    Status Revised    Target Date 08/26/21      PT LONG TERM GOAL #4   Title Pt will be able to maintain balance and fully disengage both hips and one knee to perform stand > sit with increased control with supervision    Baseline 06/02/21: up to mod assist needed for pt to self unlock hip locks in standing, then min/mod assist to unlock knees to sit safely to mat surface, was +2 assist    Time 8    Period Weeks    Status Revised    Target Date 08/26/21                   Plan - 07/29/21 1633     Clinical Impression Statement Pt continues to require decreased cues and assistance for donning RGO  and shoes edge of mat to simulate donning at home.  Continues to require  min-mod A during initial standing to ensure all joints lock into extension and to prevent fall; also continues to require min A for safe ambulation over level surfaces and >75% assistance for safe stand > sit.  Will continue to address in order to progress towards greater independence with RGO use for home.    Personal Factors and Comorbidities Comorbidity 3+;Past/Current Experience;Transportation    Comorbidities paraplegia, colostomy, spasticity, neurogenic bowel and bladder, chronic pain, pressure ulcers    Examination-Activity Limitations Locomotion Level;Stand    Examination-Participation Restrictions Community Activity;Driving    Rehab Potential Good    PT Frequency 1x / week    PT Duration 8 weeks    PT Treatment/Interventions ADLs/Self Care Home Management;DME Instruction;Gait training;Functional mobility training;Therapeutic activities;Therapeutic exercise;Balance training;Neuromuscular re-education;Patient/family education;Orthotic Fit/Training    PT Next Visit Plan Put heel wedges in each shoe under AFO to improve weight shift forwards; .  Will likely don RGO on couch (bed too tall) sit RGO in middle of mat and fully ABD each LE; has to don shoes by propping foot on chair in front of him with hip/knee flexion, other foot on floor. continue to practice and problem solve independent sit <> stand.  Decreasing assistance during gait.  Begin to add obstacles to simulate home and problem solving what to do if one hip accidentally unlocks.    Consulted and Agree with Plan of Care Patient    Family Member Consulted --             Patient will benefit from skilled therapeutic intervention in order to improve the following deficits and impairments:  Abnormal gait, Decreased balance, Decreased strength, Difficulty walking, Impaired sensation, Impaired tone  Visit Diagnosis: Paraplegia, complete (HCC)  Muscle weakness (generalized)  Unsteadiness on feet  Other abnormalities of gait and  mobility  Other symptoms and signs involving the nervous system  Other disturbances of skin sensation     Problem List Patient Active Problem List   Diagnosis Date Noted   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

## 2021-08-04 ENCOUNTER — Other Ambulatory Visit: Payer: Self-pay

## 2021-08-04 ENCOUNTER — Ambulatory Visit: Payer: BLUE CROSS/BLUE SHIELD | Admitting: Physical Therapy

## 2021-08-04 ENCOUNTER — Encounter: Payer: Self-pay | Admitting: Physical Therapy

## 2021-08-04 DIAGNOSIS — R2681 Unsteadiness on feet: Secondary | ICD-10-CM

## 2021-08-04 DIAGNOSIS — M6281 Muscle weakness (generalized): Secondary | ICD-10-CM

## 2021-08-04 DIAGNOSIS — R2689 Other abnormalities of gait and mobility: Secondary | ICD-10-CM

## 2021-08-04 DIAGNOSIS — G8221 Paraplegia, complete: Secondary | ICD-10-CM

## 2021-08-05 NOTE — Therapy (Signed)
[facility] [address] [place], [place], [address] Phone: [phone]   Fax:  [phone]  Physical Therapy Treatment  Patient Details  Name: Luke Velasquez MRN: [id number] Date of Birth: [birth year] Referring Provider (PT): Glennie Isle, MD (Physician in [place]; care has been transferred to Courtney Heys, MD   Encounter Date: 08/04/2021   PT End of Session - 08/04/21 1536     Visit Number 39    Number of Visits 43    Date for PT Re-Evaluation 08/26/21    Authorization Type BCBS    Authorization Time Period VL: 60 PT/OT/SLP combined    Authorization - Visit Number 3   for the new year 2023   Authorization - Number of Visits 60    PT Start Time 1534    PT Stop Time 1615    PT Time Calculation (min) 41 min    Equipment Utilized During Treatment Other (comment)   RGO   Activity Tolerance Patient tolerated treatment well    Behavior During Therapy WFL for tasks assessed/performed             Past Medical History:  Diagnosis Date   DVT (deep venous thrombosis) ([place])     History reviewed. No pertinent surgical history.  There were no vitals filed for this visit.   Subjective Assessment - 08/04/21 1535     Subjective No new complatins. No falls. Still with upper trap soreness.    Pertinent History paraplegia, colostomy, spasticity, neurogenic bowel and bladder, chronic pain, pressure ulcers    Patient Stated Goals To be able to walk around the house with KAFO's.  Pt owns a standard walker.    Currently in Pain? No/denies    Pain Score 0-No pain                               [facility] Adult PT Treatment/Exercise - 08/04/21 1538       Transfers   Transfers Sit to Stand;Stand to Sit    Sit to Stand 4: Min assist;From elevated surface;With upper extremity assist;From bed    Stand to Sit 3: Mod assist;With upper extremity assist;To bed      Ambulation/Gait   Ambulation/Gait Yes     Ambulation/Gait Assistance 4: Min guard;4: Min assist    Ambulation Distance (Feet) 230 Feet    Assistive device Rolling walker;Other (Comment)    Gait Pattern Step-through pattern;Poor foot clearance - left;Poor foot clearance - right    Ambulation Surface Level;Indoor      Therapeutic Activites    Therapeutic Activities Other Therapeutic Activities                       PT Short Term Goals - 07/31/20 1625       PT SHORT TERM GOAL #1   Title = LTG               PT Long Term Goals - 06/10/21 1104       PT LONG TERM GOAL #1   Title Pt will demonstrate ability to set up and don RGO with supervision to simulate donning on couch at home    Baseline 06/02/21: min to mod assist to set up and don RGO at edge of mat    Time 8    Period Weeks    Status Revised    Target Date 08/26/21      PT  LONG TERM GOAL #2   Title Pt will engage hip lock and perform sit > stand from low mat with RW and supervision    Baseline 06/02/21: min assist needed to lock 2nd knee locks once in standing with min guard to min assist to stand mat to RW    Time 8    Period Weeks    Status Revised    Target Date 08/26/21      PT LONG TERM GOAL #3   Title Pt will perform gait x 115' over indoor surfaces with RW, RGO and supervision    Baseline 06/02/21: met the distance portion of goal, continues to need up to min/mod assist    Time 8    Period Weeks    Status Revised    Target Date 08/26/21      PT LONG TERM GOAL #4   Title Pt will be able to maintain balance and fully disengage both hips and one knee to perform stand > sit with increased control with supervision    Baseline 06/02/21: up to mod assist needed for pt to self unlock hip locks in standing, then min/mod assist to unlock knees to sit safely to mat surface, was +2 assist    Time 8    Period Weeks    Status Revised    Target Date 08/26/21                   Plan - 08/04/21 1537     Clinical Impression Statement  Today's skilled session continued with pt self donning/doffing RGO/shoes with increased assitance needed today as pt reported "not feeling that great". Continued to work on gait with RGO/wide RW with addition of turns around mats, including narrow spaces incorporated on 2cd lap with min assist needed for balance. Was able to have better control with sitting to mat after gait with assist to get left LE extended out as pt unlocked right knee after unlocking bil hips. The pt is making progress toward goals and should benefit from continued PT to progress toward unmet goals.    Personal Factors and Comorbidities Comorbidity 3+;Past/Current Experience;Transportation    Comorbidities paraplegia, colostomy, spasticity, neurogenic bowel and bladder, chronic pain, pressure ulcers    Examination-Activity Limitations Locomotion Level;Stand    Examination-Participation Restrictions Community Activity;Driving    Rehab Potential Good    PT Frequency 1x / week    PT Duration 8 weeks    PT Treatment/Interventions ADLs/Self Care Home Management;DME Instruction;Gait training;Functional mobility training;Therapeutic activities;Therapeutic exercise;Balance training;Neuromuscular re-education;Patient/family education;Orthotic Fit/Training    PT Next Visit Plan Put heel wedges in each shoe under AFO to improve weight shift forwards; .  Will likely don RGO on couch (bed too tall) sit RGO in middle of mat and fully ABD each LE; has to don shoes by propping foot on chair in front of him with hip/knee flexion, other foot on floor. continue to practice and problem solve independent sit <> stand.  Decreasing assistance during gait.  Begin to add obstacles to simulate home and problem solving what to do if one hip accidentally unlocks.    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 [address] [phone] 08/05/21, 9:17 AM   Name: Luke Velasquez MRN: [id number] Date of Birth: [birth year]

## 2021-08-11 ENCOUNTER — Other Ambulatory Visit: Payer: Self-pay

## 2021-08-11 ENCOUNTER — Ambulatory Visit: Payer: BLUE CROSS/BLUE SHIELD | Admitting: Physical Therapy

## 2021-08-11 DIAGNOSIS — R29818 Other symptoms and signs involving the nervous system: Secondary | ICD-10-CM

## 2021-08-11 DIAGNOSIS — G8221 Paraplegia, complete: Secondary | ICD-10-CM | POA: Diagnosis not present

## 2021-08-11 DIAGNOSIS — R2689 Other abnormalities of gait and mobility: Secondary | ICD-10-CM

## 2021-08-11 DIAGNOSIS — M6281 Muscle weakness (generalized): Secondary | ICD-10-CM

## 2021-08-11 DIAGNOSIS — R2681 Unsteadiness on feet: Secondary | ICD-10-CM

## 2021-08-12 NOTE — Therapy (Signed)
[facility] [address] [place], [place], [address] Phone: [phone]   Fax:  [phone]  Physical Therapy Treatment  Patient Details  Name: Luke Velasquez MRN: [id number] Date of Birth: 10/09/2000 Referring Provider (PT): Glennie Isle, MD (Physician in [place]; care has been transferred to Courtney Heys, MD   Encounter Date: 08/11/2021   PT End of Session - 08/12/21 1057     Visit Number 40    Number of Visits 43    Date for PT Re-Evaluation 08/26/21    Authorization Type BCBS    Authorization Time Period VL: 60 PT/OT/SLP combined    Authorization - Visit Number 4   for the new year 2023   Authorization - Number of Visits 60    PT Start Time 1539    PT Stop Time 1620    PT Time Calculation (min) 41 min    Equipment Utilized During Treatment Other (comment)   RGO   Activity Tolerance Patient tolerated treatment well    Behavior During Therapy WFL for tasks assessed/performed             Past Medical History:  Diagnosis Date   DVT (deep venous thrombosis) ([place])     No past surgical history on file.  There were no vitals filed for this visit.   Subjective Assessment - 08/12/21 1046     Subjective Pain in "all the usual places."  Sitting down went better last session.  Thinks he is close to being able to perform ambulation with RGO at home; thinks the sit <> stand will be the most difficult    Pertinent History paraplegia, colostomy, spasticity, neurogenic bowel and bladder, chronic pain, pressure ulcers    Patient Stated Goals To be able to walk around the house with KAFO's.  Pt owns a standard walker.    Currently in Pain? Yes               [facility] Adult PT Treatment/Exercise - 08/12/21 1048       Transfers   Transfers Sit to Stand;Stand to Sit    Sit to Stand 4: Min assist    Sit to Stand Details (indicate cue type and reason) From low mat with UE support on RW; performed x 3 reps with  intermittent assistance needed to lock hips and knees into extension fully    Stand to Sit 4: Min assist    Stand to Sit Details PT provided assistance to stabilize RW as pt positioned feet in staggered, unlocked hips, and transitioned to leaning forwards to unlock R knee; min A to control descent until L knee unlocked      Ambulation/Gait   Ambulation/Gait Yes    Ambulation/Gait Assistance 4: Min assist    Ambulation/Gait Assistance Details Pt focusing on trunk extension to initiate hip flexion with PT providing min A for full anterior-lateral weight shift over stance LE.    Ambulation Distance (Feet) 230 Feet    Assistive device Rolling walker;Other (Comment)    Gait Pattern Step-through pattern;Poor foot clearance - left;Poor foot clearance - right    Ambulation Surface Level;Indoor      Therapeutic Activites    Therapeutic Activities Other Therapeutic Activities    Other Therapeutic Activities RGO set up on mat with hips unlocked and abducted.  Pt able to transfer into position and don each LE with min A to ADD hips fully into locked position.  Pt still having slight difficulty with positioning trunk inside back brace.  PT positioned self behind patient to simulate sitting on couch with back support to allow pt to lean back to improve positioning of trunk and strap across chest.  PT assisted with shoe donning today.              PT Short Term Goals - 07/31/20 1625       PT SHORT TERM GOAL #1   Title = LTG               PT Long Term Goals - 06/10/21 1104       PT LONG TERM GOAL #1   Title Pt will demonstrate ability to set up and don RGO with supervision to simulate donning on couch at home    Baseline 06/02/21: min to mod assist to set up and don RGO at edge of mat    Time 8    Period Weeks    Status Revised    Target Date 08/26/21      PT LONG TERM GOAL #2   Title Pt will engage hip lock and perform sit > stand from low mat with RW and supervision    Baseline  06/02/21: min assist needed to lock 2nd knee locks once in standing with min guard to min assist to stand mat to RW    Time 8    Period Weeks    Status Revised    Target Date 08/26/21      PT LONG TERM GOAL #3   Title Pt will perform gait x 115' over indoor surfaces with RW, RGO and supervision    Baseline 06/02/21: met the distance portion of goal, continues to need up to min/mod assist    Time 8    Period Weeks    Status Revised    Target Date 08/26/21      PT LONG TERM GOAL #4   Title Pt will be able to maintain balance and fully disengage both hips and one knee to perform stand > sit with increased control with supervision    Baseline 06/02/21: up to mod assist needed for pt to self unlock hip locks in standing, then min/mod assist to unlock knees to sit safely to mat surface, was +2 assist    Time 8    Period Weeks    Status Revised    Target Date 08/26/21                   Plan - 08/12/21 1058     Clinical Impression Statement Pt demonstrated significant improvement in ability to sequence and perform sit <> stand with decreased assistance from PT.  Pt also continues to require decreased assistance from PT during gait to advance and clear each LE.  Will continue to progress towards more independent use of RGO.    Personal Factors and Comorbidities Comorbidity 3+;Past/Current Experience;Transportation    Comorbidities paraplegia, colostomy, spasticity, neurogenic bowel and bladder, chronic pain, pressure ulcers    Examination-Activity Limitations Locomotion Level;Stand    Examination-Participation Restrictions Community Activity;Driving    Rehab Potential Good    PT Frequency 1x / week    PT Duration 8 weeks    PT Treatment/Interventions ADLs/Self Care Home Management;DME Instruction;Gait training;Functional mobility training;Therapeutic activities;Therapeutic exercise;Balance training;Neuromuscular re-education;Patient/family education;Orthotic Fit/Training    PT Next  Visit Plan Will likely don RGO on couch; sit RGO in middle of mat and fully ABD each LE; when strapping back brace - have pt lean back against support to position trunk and allow for  tighter velcro adjustment.   Has to don shoes by propping foot on chair in front of him with hip/knee flexion, other foot on floor. continue to practice independent sit <> stand.  Begin to add obstacles to simulate home and problem solving what to do if one hip accidentally unlocks.    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 [place], [place], [address] Phone: [phone]   Fax:  [phone]  Name: Luke Velasquez MRN: [id number] Date of Birth: 2000/09/16

## 2021-08-20 ENCOUNTER — Encounter: Payer: Self-pay | Admitting: Physical Therapy

## 2021-08-20 ENCOUNTER — Ambulatory Visit: Payer: BLUE CROSS/BLUE SHIELD | Attending: Physical Medicine and Rehabilitation | Admitting: Physical Therapy

## 2021-08-20 ENCOUNTER — Other Ambulatory Visit: Payer: Self-pay

## 2021-08-20 DIAGNOSIS — R208 Other disturbances of skin sensation: Secondary | ICD-10-CM | POA: Insufficient documentation

## 2021-08-20 DIAGNOSIS — R29818 Other symptoms and signs involving the nervous system: Secondary | ICD-10-CM | POA: Diagnosis present

## 2021-08-20 DIAGNOSIS — M6281 Muscle weakness (generalized): Secondary | ICD-10-CM | POA: Diagnosis present

## 2021-08-20 DIAGNOSIS — G8221 Paraplegia, complete: Secondary | ICD-10-CM | POA: Insufficient documentation

## 2021-08-20 DIAGNOSIS — R2689 Other abnormalities of gait and mobility: Secondary | ICD-10-CM | POA: Diagnosis present

## 2021-08-20 DIAGNOSIS — R2681 Unsteadiness on feet: Secondary | ICD-10-CM | POA: Insufficient documentation

## 2021-08-22 NOTE — Therapy (Signed)
[facility] [address] [place], [place], [address] Phone: [phone]   Fax:  [phone]  Physical Therapy Treatment  Patient Details  Name: Luke Velasquez MRN: [id number] Date of Birth: June 16, 2001 Referring Provider (PT): Glennie Isle, MD (Physician in [place]; care has been transferred to Courtney Heys, MD   Encounter Date: 08/20/2021    08/20/21 1536  PT Visits / Re-Eval  Visit Number 41  Number of Visits 43  Date for PT Re-Evaluation 08/26/21  Authorization  Authorization Type BCBS  Authorization Time Period VL: 60 PT/OT/SLP combined  Authorization - Visit Number 5 (for the new year 2023)  Authorization - Number of Visits 60  PT Time Calculation  PT Start Time 8786  PT Stop Time 1615  PT Time Calculation (min) 42 min  PT - End of Session  Equipment Utilized During Treatment Other (comment) (RGO)  Activity Tolerance Patient tolerated treatment well  Behavior During Therapy WFL for tasks assessed/performed    Past Medical History:  Diagnosis Date   DVT (deep venous thrombosis) ([place])     History reviewed. No pertinent surgical history.  There were no vitals filed for this visit.     08/20/21 1535  Symptoms/Limitations  Subjective No new complaints. No falls to report. Pain in the "usual spots".  Pertinent History paraplegia, colostomy, spasticity, neurogenic bowel and bladder, chronic pain, pressure ulcers  Patient Stated Goals To be able to walk around the house with KAFO's.  Pt owns a standard walker.  Pain Assessment  Currently in Pain? Yes  Pain Score 5  Pain Location Generalized (back and rib areas)  Pain Orientation Right  Pain Descriptors / Indicators Aching  Pain Type Chronic pain  Pain Onset More than a month ago  Pain Frequency Intermittent  Aggravating Factors  certain movements  Pain Relieving Factors unsure, stretching "sometimes"      08/20/21 1537  Transfers   Transfers Sit to Stand;Stand to Sit  Sit to Stand 4: Min guard;With upper extremity assist;From bed  Sit to Stand Details (indicate cue type and reason) from low mat wtih hip lockes engaged to lock upon standing. Pt used UE's on walker to stand with bil hip and knee locks engaging in standing without assitance  Stand to Sit 3: Mod assist;With upper extremity assist;To bed  Stand to Sit Details pt able to self unlock hips with feet in staggered stance, moving one UE to center of walker and reaching down with other UE to unlock right knee. pt unable to reach all the way down needing assistance to unlock bil knee locks.  Ambulation/Gait  Ambulation/Gait Yes  Ambulation/Gait Assistance 4: Min assist  Ambulation/Gait Assistance Details assitance/faciliation for weight shifting over stance leg while pt focused on trunk extension to initiate hip flexion for swing phase  Ambulation Distance (Feet) 230 Feet  Assistive device Rolling walker;Other (Comment)  Gait Pattern Step-through pattern;Poor foot clearance - left;Poor foot clearance - right  Ambulation Surface Level;Indoor  Therapeutic Activites   Therapeutic Activities Other Therapeutic Activities  Other Therapeutic Activities RGO set up on mat with hips unlocked and abducted.  Pt able to transfer into position Pt able to bring right leg componented into adduction  Pt then able to don this component with no assistance needed. assitance needed to fully lock right leg component at hip joint. same with left LE component- pt able to self don with assistance needed to lock at hip. then PTA behind pt to simulate sofa support while pt donned  chest component.         PT Short Term Goals - 07/31/20 1625       PT SHORT TERM GOAL #1   Title = LTG               PT Long Term Goals - 06/10/21 1104       PT LONG TERM GOAL #1   Title Pt will demonstrate ability to set up and don RGO with supervision to simulate donning on couch at home     Baseline 06/02/21: min to mod assist to set up and don RGO at edge of mat    Time 8    Period Weeks    Status Revised    Target Date 08/26/21      PT LONG TERM GOAL #2   Title Pt will engage hip lock and perform sit > stand from low mat with RW and supervision    Baseline 06/02/21: min assist needed to lock 2nd knee locks once in standing with min guard to min assist to stand mat to RW    Time 8    Period Weeks    Status Revised    Target Date 08/26/21      PT LONG TERM GOAL #3   Title Pt will perform gait x 115' over indoor surfaces with RW, RGO and supervision    Baseline 06/02/21: met the distance portion of goal, continues to need up to min/mod assist    Time 8    Period Weeks    Status Revised    Target Date 08/26/21      PT LONG TERM GOAL #4   Title Pt will be able to maintain balance and fully disengage both hips and one knee to perform stand > sit with increased control with supervision    Baseline 06/02/21: up to mod assist needed for pt to self unlock hip locks in standing, then min/mod assist to unlock knees to sit safely to mat surface, was +2 assist    Time 8    Period Weeks    Status Revised    Target Date 08/26/21              08/20/21 1536  Plan  Clinical Impression Statement Today's session continued to focus on use of RGO with focus on pt self donning, transfers ang gait for working towards use at home. No issues noted or reported in session. The pt should benefit from continued PT to progress toward unmet goals.  Personal Factors and Comorbidities Comorbidity 3+;Past/Current Experience;Transportation  Comorbidities paraplegia, colostomy, spasticity, neurogenic bowel and bladder, chronic pain, pressure ulcers  Examination-Activity Limitations Locomotion Level;Stand  Examination-Participation Restrictions Community Activity;Driving  Pt will benefit from skilled therapeutic intervention in order to improve on the following deficits Abnormal gait;Decreased  balance;Decreased strength;Difficulty walking;Impaired sensation;Impaired tone  Rehab Potential Good  PT Frequency 1x / week  PT Duration 8 weeks  PT Treatment/Interventions ADLs/Self Care Home Management;DME Instruction;Gait training;Functional mobility training;Therapeutic activities;Therapeutic exercise;Balance training;Neuromuscular re-education;Patient/family education;Orthotic Fit/Training  PT Next Visit Plan Will likely don RGO on couch; sit RGO in middle of mat and fully ABD each LE; when strapping back brace - have pt lean back against support to position trunk and allow for tighter velcro adjustment.   Has to don shoes by propping foot on chair in front of him with hip/knee flexion, other foot on floor. continue to practice independent sit <> stand.  Begin to add obstacles to simulate home and problem solving what to  do if one hip accidentally unlocks.  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

## 2021-08-25 ENCOUNTER — Other Ambulatory Visit: Payer: Self-pay

## 2021-08-25 ENCOUNTER — Ambulatory Visit: Payer: BLUE CROSS/BLUE SHIELD | Admitting: Physical Therapy

## 2021-08-25 ENCOUNTER — Encounter: Payer: Self-pay | Admitting: Physical Therapy

## 2021-08-25 DIAGNOSIS — R2689 Other abnormalities of gait and mobility: Secondary | ICD-10-CM

## 2021-08-25 DIAGNOSIS — G8221 Paraplegia, complete: Secondary | ICD-10-CM | POA: Diagnosis not present

## 2021-08-25 DIAGNOSIS — M6281 Muscle weakness (generalized): Secondary | ICD-10-CM

## 2021-08-25 DIAGNOSIS — R2681 Unsteadiness on feet: Secondary | ICD-10-CM

## 2021-08-25 DIAGNOSIS — R29818 Other symptoms and signs involving the nervous system: Secondary | ICD-10-CM

## 2021-08-26 NOTE — Therapy (Signed)
[facility] [address] [place], [place], [address] Phone: [phone]   Fax:  [phone]  Physical Therapy Treatment  Patient Details  Name: Luke Velasquez MRN: [id number] Date of Birth: November 05, 2000 Referring Provider (PT): Glennie [place], MD (Physician in [place]; care has been transferred to Courtney Heys, MD   Encounter Date: 08/25/2021   PT End of Session - 08/25/21 1559     Visit Number 42    Number of Visits 43    Date for PT Re-Evaluation 08/26/21    Authorization Type BCBS    Authorization Time Period VL: 60 PT/OT/SLP combined    Authorization - Visit Number 6   for the new year 2023   Authorization - Number of Visits 68    PT Start Time 1541   pt late for session   PT Stop Time 1615    PT Time Calculation (min) 34 min    Equipment Utilized During Treatment Other (comment)   RGO   Activity Tolerance Patient tolerated treatment well    Behavior During Therapy WFL for tasks assessed/performed             Past Medical History:  Diagnosis Date   DVT (deep venous thrombosis) ([place])     History reviewed. No pertinent surgical history.  There were no vitals filed for this visit.   Subjective Assessment - 08/25/21 1544     Subjective No falls. Pain is worse today. In bil shoulder's and sacrum due to new small wound. Did not take anything today.    Pertinent History paraplegia, colostomy, spasticity, neurogenic bowel and bladder, chronic pain, pressure ulcers    Patient Stated Goals To be able to walk around the house with KAFO's.  Pt owns a standard walker.    Currently in Pain? Yes    Pain Score 7     Pain Location Generalized                        [facility] Adult PT Treatment/Exercise - 08/25/21 1615       Transfers   Transfers Sit to Stand;Stand to Sit    Sit to Stand 4: Min guard;With upper extremity assist;From bed    Sit to Stand Details (indicate cue type and reason) from low  mat to RW with assitance needed to engaga bil hip and knee locks in standing with cues/faciliation for hip extension to lock hips and overpressure at knee to engage knee locks.    Stand to Sit 3: Mod assist;With upper extremity assist;To bed    Stand to Sit Details pt able to self unlock bil hip locks, then with left hand on center of RW pt able to reach down to unlock right knee. left knee lock not unlock on edge of mat in low position (same height of sofa at home) and PTA having to unlock left side. with need to problem solve how pt will do this at home since sofa will not be high enougth to engage lock with sitting.      Ambulation/Gait   Ambulation/Gait Yes    Ambulation/Gait Assistance 4: Min assist    Ambulation/Gait Assistance Details Pt with focus on trunk extension with PTA assiting with anterior<>lateral weight shifting. Couple of stops to rest UE's due to increased pain today.    Ambulation Distance (Feet) 230 Feet    Assistive device Rolling walker;Other (Comment)   RGO   Gait Pattern Step-through pattern;Poor foot clearance - left;Poor foot  clearance - right    Ambulation Surface Level;Indoor      Therapeutic Activites    Therapeutic Activities Other Therapeutic Activities    Other Therapeutic Activities RGO set up on mat with hips unlocked and abducted.  Pt able to transfer into position Pt able to bring right leg componented into adduction  Pt then able to don this component with no assistance needed. assitance needed to fully lock right leg component at hip joint. same with left LE component- pt able to self don with assistance needed to lock at hip. Pt able to get shoes onto feet, however unable to full pull them up over heels, total assist from PTA for this. Tried using long handle shoe horn with no success either.  then PTA behind pt to simulate sofa support while pt donned chest component.                       PT Short Term Goals - 07/31/20 1625       PT SHORT  TERM GOAL #1   Title = LTG               PT Long Term Goals - 06/10/21 1104       PT LONG TERM GOAL #1   Title Pt will demonstrate ability to set up and don RGO with supervision to simulate donning on couch at home    Baseline 06/02/21: min to mod assist to set up and don RGO at edge of mat    Time 8    Period Weeks    Status Revised    Target Date 08/26/21      PT LONG TERM GOAL #2   Title Pt will engage hip lock and perform sit > stand from low mat with RW and supervision    Baseline 06/02/21: min assist needed to lock 2nd knee locks once in standing with min guard to min assist to stand mat to RW    Time 8    Period Weeks    Status Revised    Target Date 08/26/21      PT LONG TERM GOAL #3   Title Pt will perform gait x 115' over indoor surfaces with RW, RGO and supervision    Baseline 06/02/21: met the distance portion of goal, continues to need up to min/mod assist    Time 8    Period Weeks    Status Revised    Target Date 08/26/21      PT LONG TERM GOAL #4   Title Pt will be able to maintain balance and fully disengage both hips and one knee to perform stand > sit with increased control with supervision    Baseline 06/02/21: up to mod assist needed for pt to self unlock hip locks in standing, then min/mod assist to unlock knees to sit safely to mat surface, was +2 assist    Time 8    Period Weeks    Status Revised    Target Date 08/26/21                   Plan - 08/25/21 1559     Clinical Impression Statement Today's skilled session continued to focus on pt self donning RGO and transfers/gait with RGO. Pt continues to need assistance to fully lock hip componets from abducted position prior to standing and for donning shoes. Today pt needed assist to engage all locks upon standing. May benefit from having family member present  for a session to learn how to assist pt at home. Will continue to progress pt toward goals.    Personal Factors and Comorbidities  Comorbidity 3+;Past/Current Experience;Transportation    Comorbidities paraplegia, colostomy, spasticity, neurogenic bowel and bladder, chronic pain, pressure ulcers    Examination-Activity Limitations Locomotion Level;Stand    Examination-Participation Restrictions Community Activity;Driving    Rehab Potential Good    PT Frequency 1x / week    PT Duration 8 weeks    PT Treatment/Interventions ADLs/Self Care Home Management;DME Instruction;Gait training;Functional mobility training;Therapeutic activities;Therapeutic exercise;Balance training;Neuromuscular re-education;Patient/family education;Orthotic Fit/Training    PT Next Visit Plan Will likely don RGO on couch; sit RGO in middle of mat and fully ABD each LE; when strapping back brace - have pt lean back against support to position trunk and allow for tighter velcro adjustment.   Has to don shoes by propping foot on chair in front of him with hip/knee flexion, other foot on floor. continue to practice independent sit <> stand.  Begin to add obstacles to simulate home and problem solving what to do if one hip accidentally unlocks.    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, [place], [place] [address] [phone] 08/26/21, 9:45 AM   Name: Luke Velasquez MRN: [id number] Date of Birth: 03/16/2001

## 2021-09-01 ENCOUNTER — Ambulatory Visit: Payer: BLUE CROSS/BLUE SHIELD | Admitting: Physical Therapy

## 2021-09-01 ENCOUNTER — Other Ambulatory Visit: Payer: Self-pay

## 2021-09-01 ENCOUNTER — Encounter: Payer: Self-pay | Admitting: Physical Therapy

## 2021-09-01 DIAGNOSIS — R2689 Other abnormalities of gait and mobility: Secondary | ICD-10-CM

## 2021-09-01 DIAGNOSIS — R29818 Other symptoms and signs involving the nervous system: Secondary | ICD-10-CM

## 2021-09-01 DIAGNOSIS — G8221 Paraplegia, complete: Secondary | ICD-10-CM | POA: Diagnosis not present

## 2021-09-01 DIAGNOSIS — M6281 Muscle weakness (generalized): Secondary | ICD-10-CM

## 2021-09-01 DIAGNOSIS — R2681 Unsteadiness on feet: Secondary | ICD-10-CM

## 2021-09-01 DIAGNOSIS — R208 Other disturbances of skin sensation: Secondary | ICD-10-CM

## 2021-09-01 NOTE — Therapy (Signed)
[facility] [address] [place], [place], [address] Phone: [phone]   Fax:  [phone]  Physical Therapy Treatment  Patient Details  Name: Luke Velasquez MRN: [id number] Date of Birth: [birth year] Referring Provider (PT): Glennie Isle, MD (Physician in [place]; care has been transferred to Courtney Heys, MD   Encounter Date: 09/01/2021   PT End of Session - 09/01/21 1957     Visit Number 43    Number of Visits 43    Date for PT Re-Evaluation 08/26/21    Authorization Type BCBS    Authorization Time Period VL: 60 PT/OT/SLP combined    Authorization - Visit Number 7   for the new year 2023   Authorization - Number of Visits 60    PT Start Time 1533    PT Stop Time 1615    PT Time Calculation (min) 42 min    Equipment Utilized During Treatment Other (comment)   RGO   Activity Tolerance Patient tolerated treatment well    Behavior During Therapy WFL for tasks assessed/performed             Past Medical History:  Diagnosis Date   DVT (deep venous thrombosis) ([place])     History reviewed. No pertinent surgical history.  There were no vitals filed for this visit.   Subjective Assessment - 09/01/21 1947     Subjective Continues to have pain in L shoulder.  Does not have a rolling walker at home.    Pertinent History paraplegia, colostomy, spasticity, neurogenic bowel and bladder, chronic pain, pressure ulcers    Patient Stated Goals To be able to walk around the house with KAFO's.  Pt owns a standard walker.    Currently in Pain? Yes               [facility] Adult PT Treatment/Exercise - 09/01/21 1949       Transfers   Transfers Sit to Stand;Stand to Sit    Sit to Stand 4: Min guard    Sit to Stand Details (indicate cue type and reason) from low mat with RGO donned with UE support on RW; able to lock R knee but requires min A to lock L knee; able to lock bilat hips.    Stand to Sit 4: Min assist     Stand to Sit Details pt able to unlock bilat hips and perform quick anterior lean to unlock R knee; as he descended pt used edge of mat to unlock L knee      Ambulation/Gait   Ambulation/Gait Yes    Ambulation/Gait Assistance 4: Min guard    Ambulation/Gait Assistance Details Performed around gym making R turns today; intermittent assistance from PT required to shift weight laterally and forwards to toes to unweight and allow spring to advance contralateral LE forwards.  Intermittent standing rest breaks to rest UE and shoulder.    Ambulation Distance (Feet) 230 Feet    Assistive device Rolling walker;Other (Comment)    Gait Pattern Step-through pattern;Decreased weight shift to right;Decreased weight shift to left;Poor foot clearance - left    Ambulation Surface Level;Indoor      Therapeutic Activites    Therapeutic Activities Other Therapeutic Activities    Other Therapeutic Activities RGO set up on mat with hips unlocked and abducted.  Inverted chair placed behind pt to simulate couch and intermittent back support for stability and positioning of back brace.  Pt able to transfer into position and don/strap each LE into brace.  Placed chair in front of patient: pt utilized hip flexion, knee flexion and hip ABD to bring each LE on to chair, don shoe and then return LE to floor to push heel into shoe.  Pt then locked bilat hips into ADD.              PT Education - 09/01/21 1956     Education Details will order patient wide, heavy duty RW for home; discussed plan for final two sessions and need to train someone to help with RGO at home and that pt is at the level where he could begin to practice every day at home with supervision-min A.    Person(s) Educated Patient    Methods Explanation    Comprehension Verbalized understanding              PT Short Term Goals - 07/31/20 1625       PT SHORT TERM GOAL #1   Title = LTG               PT Long Term Goals - 09/01/21 2041        PT LONG TERM GOAL #1   Title Pt will demonstrate ability to set up and don RGO with supervision to simulate donning on couch at home    Baseline min A    Time 8    Period Weeks    Status Partially Met    Target Date 08/26/21      PT LONG TERM GOAL #2   Title Pt will engage hip lock and perform sit > stand from low mat with RW and supervision    Baseline Min A to lock 2nd knee    Time 8    Period Weeks    Status Partially Met    Target Date 08/26/21      PT LONG TERM GOAL #3   Title Pt will perform gait x 115' over indoor surfaces with RW, RGO and supervision    Baseline 230' with min guard    Time 8    Period Weeks    Status Partially Met    Target Date 08/26/21      PT LONG TERM GOAL #4   Title Pt will be able to maintain balance and fully disengage both hips and one knee to perform stand > sit with increased control with supervision    Baseline Min A    Time 8    Period Weeks    Status Partially Met    Target Date 08/26/21            New goals for recertification:     Plan - 09/01/21 2043     Clinical Impression Statement Pt is making steady progress towards LTG and has partially met all LTG.  Pt requires supervision-min A overall for donning RGO, sit <> stand and gait over level surfaces with RW.  Patient will benefit from 2-3 more sessions to review donning, transfers, and gait with family in order for pt to transition to use at home.  Plan to renew for up to 4 more weeks if more than two visits needed to transition.    Personal Factors and Comorbidities Comorbidity 3+;Past/Current Experience;Transportation    Comorbidities paraplegia, colostomy, spasticity, neurogenic bowel and bladder, chronic pain, pressure ulcers    Examination-Activity Limitations Locomotion Level;Stand    Examination-Participation Restrictions Community Activity;Driving    Rehab Potential Good    PT Frequency 1x / week    PT Duration 4 weeks  PT Treatment/Interventions ADLs/Self  Care Home Management;DME Instruction;Gait training;Functional mobility training;Therapeutic activities;Therapeutic exercise;Balance training;Neuromuscular re-education;Patient/family education;Orthotic Fit/Training    PT Next Visit Plan Have Nehemyah explain full sequence for donning/transfers/gait as if explaining to family.  Need Mom to come to last session?  Will don RGO on couch; sit RGO in middle of mat and fully ABD each LE; when strapping back brace - have pt lean back against inverted chair for support to position trunk and allow for tighter velcro adjustment.   Has to don shoes by propping foot on chair in front of him with hip/knee flexion/hip ABD - other foot on floor. continue to practice independent sit <> stand.    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 [place], [place], [address] Phone: [phone]   Fax:  [phone]  Name: Luke Velasquez MRN: [id number] Date of Birth: 2001-04-12

## 2021-09-08 ENCOUNTER — Ambulatory Visit: Payer: BLUE CROSS/BLUE SHIELD | Admitting: Physical Therapy

## 2021-09-08 ENCOUNTER — Other Ambulatory Visit: Payer: Self-pay

## 2021-09-08 ENCOUNTER — Encounter: Payer: Self-pay | Admitting: Physical Therapy

## 2021-09-08 DIAGNOSIS — M6281 Muscle weakness (generalized): Secondary | ICD-10-CM

## 2021-09-08 DIAGNOSIS — R29818 Other symptoms and signs involving the nervous system: Secondary | ICD-10-CM

## 2021-09-08 DIAGNOSIS — R2681 Unsteadiness on feet: Secondary | ICD-10-CM

## 2021-09-08 DIAGNOSIS — R2689 Other abnormalities of gait and mobility: Secondary | ICD-10-CM

## 2021-09-08 DIAGNOSIS — G8221 Paraplegia, complete: Secondary | ICD-10-CM | POA: Diagnosis not present

## 2021-09-09 NOTE — Therapy (Signed)
[facility] [address] [place], [place], [address] Phone: [phone]   Fax:  [phone]  Physical Therapy Treatment  Patient Details  Name: Luke Velasquez MRN: [id number] Date of Birth: 2001-04-19 Referring Provider (PT): Curlene Dolphin, MD (Physician in [place]; care has been transferred to Genice Rouge, MD   Encounter Date: 09/08/2021   PT End of Session - 09/08/21 1538     Visit Number 44    Number of Visits 47    Date for PT Re-Evaluation 10/01/21    Authorization Type BCBS    Authorization Time Period VL: 60 PT/OT/SLP combined    Authorization - Visit Number 8   for the new year 2023   Authorization - Number of Visits 60    PT Start Time 1535    PT Stop Time 1615    PT Time Calculation (min) 40 min    Equipment Utilized During Treatment Other (comment)   RGO   Activity Tolerance Patient tolerated treatment well    Behavior During Therapy WFL for tasks assessed/performed             Past Medical History:  Diagnosis Date   DVT (deep venous thrombosis) (HCC)     History reviewed. No pertinent surgical history.  There were no vitals filed for this visit.   Subjective Assessment - 09/08/21 1536     Subjective No new complaints. No falls. Some pain in the neck and shoulder since last week.    Pertinent History paraplegia, colostomy, spasticity, neurogenic bowel and bladder, chronic pain, pressure ulcers    Patient Stated Goals To be able to walk around the house with KAFO's.  Pt owns a standard walker.    Currently in Pain? Yes    Pain Score 4     Pain Location Generalized    Pain Descriptors / Indicators Aching;Sore    Pain Type Chronic pain    Pain Onset More than a month ago    Pain Frequency Intermittent    Aggravating Factors  certain movements    Pain Relieving Factors unsure, "stretching sometimes"                   [facility] Adult PT Treatment/Exercise - 09/08/21 1544        Transfers   Transfers Sit to Stand;Stand to Sit    Sit to Stand 4: Min guard    Sit to Stand Details (indicate cue type and reason) froml low mat to RW. assist needed to engage bil knee locks and left hip lock in standing.    Stand to Sit 4: Min assist;3: Mod assist    Stand to Sit Details pt able to disengage bil hip locks and with one hand centered on walker reached down to unlock right knee. Left knee lock did not unlock with mat edge. pt attempted to try to tell simulated familily member/friend (another PT with clinic who has not worked with [employer]) what to do to assist however unable to fully articulate what was needed wtih PTA reaching over to unlock left knee.      Ambulation/Gait   Ambulation/Gait Yes    Ambulation/Gait Assistance 4: Min guard    Ambulation/Gait Assistance Details around track with during busy time with obstacles/people walking as well in random places needing pt to negotiate around them. Intermittent assistance needed for weight shifting and lateral balance/stability control with turns. pt's left LE circumduction at times with swing phase, this was improved when chest strap was  tightened.    Ambulation Distance (Feet) 230 Feet   x1   Assistive device Rolling walker;Other (Comment)   RGO   Gait Pattern Step-through pattern;Decreased weight shift to right;Decreased weight shift to left;Poor foot clearance - left    Ambulation Surface Level;Indoor      Therapeutic Activites    Therapeutic Activities Other Therapeutic Activities    Other Therapeutic Activities Pt seated at edge of mat with inverted chair behind him to simulate sofa back. Had another PT from clinic simulating family member/friend who is to assist pt so to have pt get used to instructing them how to assist with brace donning, transfers and gait. Pt walked PT through set up and donning of RGO with min cues/guiadance from PTA- cues to instruct in how hip locks work to lock/unlock, that he needed to don shoes prior to  standing, and to cue family/friends on how he stands. PT was good for asking leading questions on how brace works and technique/sequencing, not sure a family member/friend would ask these pertinent questions. Pt should practice again with mom present or another simulated situation with a non clinical staff member.                       PT Short Term Goals - 07/31/20 1625       PT SHORT TERM GOAL #1   Title = LTG               PT Long Term Goals - 09/01/21 2049       PT LONG TERM GOAL #1   Title Pt will demonstrate ability to set up and don RGO with supervision to simulate donning on couch at home    Baseline min A    Time 4    Period Weeks    Status Revised    Target Date 10/01/21      PT LONG TERM GOAL #2   Title Pt will engage hip lock and perform sit > stand from low mat with RW and supervision    Baseline Min A to lock 2nd knee    Time 4    Period Weeks    Status Revised    Target Date 10/01/21      PT LONG TERM GOAL #3   Title Pt will perform gait x 115' over indoor surfaces with RW, RGO and supervision    Baseline 230' with min guard    Time 4    Period Weeks    Status Revised    Target Date 10/01/21      PT LONG TERM GOAL #4   Title Pt will be able to maintain balance and fully disengage both hips and one knee to perform stand > sit with increased control with supervision    Baseline Min A    Time 4    Period Weeks    Status Revised    Target Date 10/01/21                   Plan - 09/08/21 1538     Clinical Impression Statement Today's session focused on pt's ability to lead someone through how to assist him with use of RGO brace with donning/doffing and transfers. Cues needed on directional cues and technical use of brace. Pt needs more practice to be more vocal in his directions for assistance of family/friends at home. PTA continued to assist with gait with min guard to min assist needed for balance/technique. Will need  to train  someone to assist pt for gait at home for safety at pt tends to need assistance at times.    Personal Factors and Comorbidities Comorbidity 3+;Past/Current Experience;Transportation    Comorbidities paraplegia, colostomy, spasticity, neurogenic bowel and bladder, chronic pain, pressure ulcers    Examination-Activity Limitations Locomotion Level;Stand    Examination-Participation Restrictions Community Activity;Driving    Rehab Potential Good    PT Frequency 1x / week    PT Duration 4 weeks    PT Treatment/Interventions ADLs/Self Care Home Management;DME Instruction;Gait training;Functional mobility training;Therapeutic activities;Therapeutic exercise;Balance training;Neuromuscular re-education;Patient/family education;Orthotic Fit/Training    PT Next Visit Plan Have Theron explain full sequence for donning/transfers/gait as if explaining to family- needs more practice- possibly with non clinical staff (front office vs rehab tech who is not familiar with braces/transfer techniques vs family such as mom).  Will don RGO on couch; sit RGO in middle of mat and fully ABD each LE; when strapping back brace - have pt lean back against inverted chair for support to position trunk and allow for tighter velcro adjustment.   Has to don shoes by propping foot on chair in front of him with hip/knee flexion/hip ABD - other foot on floor. continue to practice independent sit <> stand.    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 [address], [address] [place], [place] [address] [phone] 09/09/21, 8:19 PM   Name: Luke Velasquez MRN: [id number] Date of Birth: [birth year]

## 2021-09-15 ENCOUNTER — Ambulatory Visit: Payer: Self-pay | Admitting: Physical Therapy

## 2021-09-17 ENCOUNTER — Other Ambulatory Visit: Payer: Self-pay

## 2021-09-17 ENCOUNTER — Ambulatory Visit: Payer: BLUE CROSS/BLUE SHIELD | Attending: Physical Medicine and Rehabilitation | Admitting: Physical Therapy

## 2021-09-17 DIAGNOSIS — R29818 Other symptoms and signs involving the nervous system: Secondary | ICD-10-CM | POA: Diagnosis present

## 2021-09-17 DIAGNOSIS — R2681 Unsteadiness on feet: Secondary | ICD-10-CM | POA: Diagnosis present

## 2021-09-17 DIAGNOSIS — R208 Other disturbances of skin sensation: Secondary | ICD-10-CM | POA: Insufficient documentation

## 2021-09-17 DIAGNOSIS — G8221 Paraplegia, complete: Secondary | ICD-10-CM | POA: Insufficient documentation

## 2021-09-17 DIAGNOSIS — M6281 Muscle weakness (generalized): Secondary | ICD-10-CM | POA: Diagnosis present

## 2021-09-17 DIAGNOSIS — R2689 Other abnormalities of gait and mobility: Secondary | ICD-10-CM | POA: Diagnosis present

## 2021-09-17 NOTE — Therapy (Signed)
[facility] [address] [place], [place], [address] Phone: [phone]   Fax:  [phone]  Physical Therapy Treatment  Patient Details  Name: Luke Velasquez MRN: [id number] Date of Birth: February 28, 2001 Referring Provider (PT): Glennie Isle, MD (Physician in [place]; care has been transferred to Courtney Heys, MD   Encounter Date: 09/17/2021   PT End of Session - 09/17/21 2128     Visit Number 45    Number of Visits 24    Date for PT Re-Evaluation 10/01/21    Authorization Type [employer]    Authorization Time Period VL: 60 PT/OT/SLP combined    Authorization - Visit Number 9   for the new year 2023   Authorization - Number of Visits 60    PT Start Time 1540    PT Stop Time 1620    PT Time Calculation (min) 40 min    Equipment Utilized During Treatment Other (comment)   RGO   Activity Tolerance Patient tolerated treatment well    Behavior During Therapy [facility] for tasks assessed/performed             Past Medical History:  Diagnosis Date   DVT (deep venous thrombosis) ([place])     No past surgical history on file.  There were no vitals filed for this visit.   Subjective Assessment - 09/17/21 2045     Subjective Pt feels ready to take his RGO home and start using at home.    Pertinent History paraplegia, colostomy, spasticity, neurogenic bowel and bladder, chronic pain, pressure ulcers    Patient Stated Goals To be able to walk around the house with KAFO's.  Pt owns a standard walker.    Currently in Pain? No/denies    Pain Onset More than a month ago               [facility] Adult PT Treatment/Exercise - 09/17/21 2049       Transfers   Transfers Sit to Stand;Stand to Sit    Sit to Stand 4: Min assist    Sit to Stand Details (indicate cue type and reason) able to stand with supervision and lock R knee; required assistance to lock L knee and then pt able to lock bilat hips in standing.    Stand to Sit 4: Min  assist    Stand to Sit Details Able to lean forwards and unlock R knee but required assistance to unlock L and lower to mat - advised pt to position L foot forwards to R prior to leaning forwards to improve balance and ability to transition to sitting without increased pressure on L knee      Ambulation/Gait   Ambulation/Gait Yes    Ambulation/Gait Assistance 4: Min guard    Ambulation/Gait Assistance Details Intermittent standing rest breaks to rest UE and once to lower RW height.  When pt experienced L toe drag and found himself lifting his whole body with UE, pt able to stop and self-correct gait technique to increase anterior weight shift and trunk extension to active hip flexion and LE advancement.    Ambulation Distance (Feet) 115 Feet    Assistive device Rolling walker;Other (Comment)    Gait Pattern Step-through pattern;Decreased weight shift to right;Poor foot clearance - left    Ambulation Surface Level;Indoor      Therapeutic Activites    Therapeutic Activities Other Therapeutic Activities    Other Therapeutic Activities Continued to practice donning RGO seated on low mat with RGO LE  abducted and using inverted chair for back support to simulate couch at home.  Pt able to verbalize 90% of donning sequence but did require one cue to connect all velcro straps and intermittent assistance to tighten lower leg strap; pt requested assistance to don shoes today.      Exercises   Exercises Other Exercises    Other Exercises  Provided pt with and reviewed final HEP - added prone press ups, quadruped alternating UE reaching forwards and child's pose to stretch back and latissimus.               PT Education - 09/17/21 2129     Education Details set up height of new bariatric RW and added tennis balls to decrease friction; final HEP    Person(s) Educated Patient    Methods Explanation    Comprehension Verbalized understanding            Access Code: [email or web address] URL:  [email or web address] Date: 09/17/2021 Prepared by: Misty Stanley  Exercises Prone Scapular Retraction Y - 1 x daily - 7 x weekly - 2 sets - 10 reps Prone Scapular Slide with Shoulder Extension - 1 x daily - 7 x weekly - 2 sets - 10 reps Prone Shoulder Horizontal Abduction - 1 x daily - 7 x weekly - 2 sets - 10 reps Prone Press Up On Elbows - 1 x daily - 7 x weekly - 2 sets - 10 reps Quadruped Alternating Shoulder Flexion - 1 x daily - 7 x weekly - 2 sets - 10 reps Child's Pose Stretch - 1 x daily - 7 x weekly - 2 sets - 30 second hold    PT Short Term Goals - 07/31/20 1625       PT SHORT TERM GOAL #1   Title = LTG               PT Long Term Goals - 09/17/21 2130       PT LONG TERM GOAL #1   Title Pt will demonstrate ability to set up and don RGO with supervision to simulate donning on couch at home    Baseline supervision with intermittent cues    Time 4    Period Weeks    Status Achieved    Target Date 10/01/21      PT LONG TERM GOAL #2   Title Pt will engage hip lock and perform sit > stand from low mat with RW and supervision    Baseline Min A to lock 2nd knee    Time 4    Period Weeks    Status Partially Met    Target Date 10/01/21      PT LONG TERM GOAL #3   Title Pt will perform gait x 115' over indoor surfaces with RW, RGO and supervision    Baseline 230' with min guard    Time 4    Period Weeks    Status Partially Met    Target Date 10/01/21      PT LONG TERM GOAL #4   Title Pt will be able to maintain balance and fully disengage both hips and one knee to perform stand > sit with increased control with supervision    Baseline Min A    Time 4    Period Weeks    Status Partially Met    Target Date 10/01/21              Plan - 09/17/21 2132     Clinical Impression  Statement Final session focused on assessment of patient's ability to verbalize and demonstrate full set up/donning RGO to simulate donning at home with PT role-playing  friend or family member that will be providing supervision-min A at home.  Pt partially met all goals and continues to require consistent min guard and 10% to don all straps, assistance to lock both knees in standing and to safely transition from stand > sitting.  Advised pt to continue to practice at home only when he has supervision-min A from family or friends.  Also provided pt with final HEP and contact information for PT and orthotist in case of questions or issues with RGO.  Pt denies any further questions and feels ready to begin to use at home.   Pt to D/C from therapy at this time.    Personal Factors and Comorbidities Comorbidity 3+;Past/Current Experience;Transportation    Comorbidities paraplegia, colostomy, spasticity, neurogenic bowel and bladder, chronic pain, pressure ulcers    Examination-Activity Limitations Locomotion Level;Stand    Examination-Participation Restrictions Community Activity;Driving    Rehab Potential Good    PT Frequency 1x / week    PT Duration 4 weeks    PT Treatment/Interventions ADLs/Self Care Home Management;DME Instruction;Gait training;Functional mobility training;Therapeutic activities;Therapeutic exercise;Balance training;Neuromuscular re-education;Patient/family education;Orthotic Fit/Training    PT Next Visit Plan D/C    Consulted and Agree with Plan of Care Patient             Patient will benefit from skilled therapeutic intervention in order to improve the following deficits and impairments:  Abnormal gait, Decreased balance, Decreased strength, Difficulty walking, Impaired sensation, Impaired tone  Visit Diagnosis: Muscle weakness (generalized)  Other abnormalities of gait and mobility  Unsteadiness on feet  Other symptoms and signs involving the nervous system  Paraplegia, complete (HCC)  Other disturbances of skin sensation     Problem List Patient Active Problem List   Diagnosis Date Noted   Colostomy in place ([place])  05/16/2020   Paraplegia ([place]) 05/16/2020   Spasticity 05/16/2020   Neurogenic bladder 05/16/2020   Wheelchair dependence 05/16/2020    Rico Junker, PT, DPT 09/17/21    9:38 PM    PHYSICAL THERAPY DISCHARGE SUMMARY  Visits from Start of Care: 45  Current functional level related to goals / functional outcomes: See LTG achievement and impression statement above.   Remaining deficits: Transfers and gait with RGO - requires supervision-min guard   Education / Equipment: HEP, RGO, bariatric RW   Patient agrees to discharge. Patient goals were partially met. Patient is being discharged due to being pleased with the current functional level.   [facility] [address] [place], [place], [address] Phone: [phone]   Fax:  [phone]  Name: Chike Farrington MRN: [id number] Date of Birth: Apr 27, 2001

## 2021-09-17 NOTE — Patient Instructions (Signed)
Access Code: [email or web address] ?URL: [email or web address] ?Date: 09/17/2021 ?Prepared by: Bufford Lope ? ?Exercises ?Prone Scapular Retraction Y - 1 x daily - 7 x weekly - 2 sets - 10 reps ?Prone Scapular Slide with Shoulder Extension - 1 x daily - 7 x weekly - 2 sets - 10 reps ?Prone Shoulder Horizontal Abduction - 1 x daily - 7 x weekly - 2 sets - 10 reps ?Prone Press Up On Elbows - 1 x daily - 7 x weekly - 2 sets - 10 reps ?Quadruped Alternating Shoulder Flexion - 1 x daily - 7 x weekly - 2 sets - 10 reps ?Child's Pose Stretch - 1 x daily - 7 x weekly - 2 sets - 30 second hold ? ? ?

## 2022-03-10 DIAGNOSIS — Z789 Other specified health status: Secondary | ICD-10-CM | POA: Insufficient documentation

## 2022-08-05 ENCOUNTER — Other Ambulatory Visit: Payer: Self-pay

## 2022-08-05 ENCOUNTER — Emergency Department (HOSPITAL_COMMUNITY)
Admission: EM | Admit: 2022-08-05 | Discharge: 2022-08-06 | Disposition: A | Discharge status: Home / Self Care | Payer: 59 | Attending: Emergency Medicine | Admitting: Emergency Medicine

## 2022-08-05 DIAGNOSIS — F1910 Other psychoactive substance abuse, uncomplicated: Secondary | ICD-10-CM | POA: Diagnosis not present

## 2022-08-05 DIAGNOSIS — R451 Restlessness and agitation: Secondary | ICD-10-CM | POA: Insufficient documentation

## 2022-08-05 DIAGNOSIS — F29 Unspecified psychosis not due to a substance or known physiological condition: Secondary | ICD-10-CM | POA: Insufficient documentation

## 2022-08-05 MED ORDER — LORAZEPAM 1 MG PO TABS: 1.0000 mg | ORAL_TABLET | Freq: Four times a day (QID) | ORAL | Status: DC | PRN

## 2022-08-05 MED ORDER — ZOLPIDEM TARTRATE 5 MG PO TABS: 5.0000 mg | ORAL_TABLET | Freq: Every evening | ORAL | Status: DC | PRN

## 2022-08-05 MED ORDER — ALUM & MAG HYDROXIDE-SIMETH 200-200-20 MG/5ML PO SUSP: 30.0000 mL | Freq: Four times a day (QID) | ORAL | Status: DC | PRN

## 2022-08-05 MED ORDER — METOPROLOL TARTRATE 5 MG/5ML IV SOLN: 5.0000 mg | Freq: Once | INTRAVENOUS | Status: DC

## 2022-08-05 MED ORDER — LORAZEPAM 2 MG/ML IJ SOLN
2.0000 mg | Freq: Once | INTRAMUSCULAR | Status: AC
  Administered 2022-08-05: 2 mg via INTRAMUSCULAR
  Filled 2022-08-05: qty 1

## 2022-08-05 MED ORDER — ACETAMINOPHEN 325 MG PO TABS: 650.0000 mg | ORAL_TABLET | ORAL | Status: DC | PRN

## 2022-08-05 MED ORDER — ZIPRASIDONE MESYLATE 20 MG IM SOLR: 10.0000 mg | Freq: Four times a day (QID) | INTRAMUSCULAR | Status: DC | PRN

## 2022-08-05 MED ORDER — LORAZEPAM 1 MG PO TABS: 1.0000 mg | ORAL_TABLET | Freq: Once | ORAL | Status: DC
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
## 2022-08-05 NOTE — ED Notes (Signed)
ED Provider at bedside. 

## 2022-08-05 NOTE — ED Notes (Signed)
Patient refused to change into scrubs and Emergency room PD is checking patient for any harmful items before patient goes to secure ED unit.

## 2022-08-05 NOTE — ED Notes (Addendum)
Patient brought over from [facility] and begin getting loud and cussing at staff; Patient was refusing all care at this time and EDP in unit to [facility]
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
## 2022-08-06 DIAGNOSIS — F1910 Other psychoactive substance abuse, uncomplicated: Secondary | ICD-10-CM

## 2022-08-06 DIAGNOSIS — F29 Unspecified psychosis not due to a substance or known physiological condition: Secondary | ICD-10-CM | POA: Insufficient documentation

## 2022-08-06 LAB — CBC WITH DIFFERENTIAL/PLATELET
Abs Immature Granulocytes: 0.01 10*3/uL (ref 0.00–0.07)
Basophils Absolute: 0 10*3/uL (ref 0.0–0.1)
Basophils Relative: 0 %
Eosinophils Absolute: 0.1 10*3/uL (ref 0.0–0.5)
Eosinophils Relative: 1 %
HCT: 38 % — ABNORMAL LOW (ref 39.0–52.0)
Hemoglobin: 12.2 g/dL — ABNORMAL LOW (ref 13.0–17.0)
Immature Granulocytes: 0 %
Lymphocytes Relative: 25 %
Lymphs Abs: 1.8 10*3/uL (ref 0.7–4.0)
MCH: 27.7 pg (ref 26.0–34.0)
MCHC: 32.1 g/dL (ref 30.0–36.0)
MCV: 86.2 fL (ref 80.0–100.0)
Monocytes Absolute: 0.4 10*3/uL (ref 0.1–1.0)
Monocytes Relative: 6 %
Neutro Abs: 4.8 10*3/uL (ref 1.7–7.7)
Neutrophils Relative %: 68 %
Platelets: 281 10*3/uL (ref 150–400)
RBC: 4.41 MIL/uL (ref 4.22–5.81)
RDW: 13.3 % (ref 11.5–15.5)
WBC: 7 10*3/uL (ref 4.0–10.5)
nRBC: 0 % (ref 0.0–0.2)

## 2022-08-06 LAB — COMPREHENSIVE METABOLIC PANEL
ALT: 24 U/L (ref 0–44)
AST: 32 U/L (ref 15–41)
Albumin: 3.3 g/dL — ABNORMAL LOW (ref 3.5–5.0)
Alkaline Phosphatase: 111 U/L (ref 38–126)
Anion gap: 10 (ref 5–15)
BUN: 11 mg/dL (ref 6–20)
CO2: 26 mmol/L (ref 22–32)
Calcium: 9.2 mg/dL (ref 8.9–10.3)
Chloride: 103 mmol/L (ref 98–111)
Creatinine, Ser: 0.79 mg/dL (ref 0.61–1.24)
GFR, Estimated: >60 mL/min (ref >60)
Glucose, Bld: 113 mg/dL — ABNORMAL HIGH (ref 70–99)
Potassium: 3.3 mmol/L — ABNORMAL LOW (ref 3.5–5.1)
Sodium: 139 mmol/L (ref 135–145)
Total Bilirubin: 0.4 mg/dL (ref 0.3–1.2)
Total Protein: 6.8 g/dL (ref 6.5–8.1)

## 2022-08-06 LAB — RAPID URINE DRUG SCREEN, HOSP PERFORMED
Amphetamines: NOT DETECTED
Barbiturates: NOT DETECTED
Benzodiazepines: NOT DETECTED
Cocaine: NOT DETECTED
Opiates: NOT DETECTED
Tetrahydrocannabinol: POSITIVE — AB

## 2022-08-06 LAB — ETHANOL: Alcohol, Ethyl (B): <10 mg/dL (ref <10)

## 2022-08-06 NOTE — ED Notes (Addendum)
Pt given his belongings. Per pt's mother, the door will be unlocked for pt once he arrives to house and her father will be there. Went over pt discharge instructions with pt. Pt remains irritable, but cooperative at this time. Informed pt that we are waiting for [facility] to transport him home. Follow-up care and reading materials reviewed.
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
## 2022-08-06 NOTE — ED Notes (Addendum)
Patient left with mother, patient refused d/c vital signs. Patient's belongings returned to him.

## 2022-08-06 NOTE — ED Notes (Signed)
Spoke w/ pt's mom who reports she is currently at work. Explained that pt was being discharged and that the NP would be calling her to update her. Pt's mom to call this RN back when she has an ETA to pick up pt d/t she has to go to the house and get his wheelchair.

## 2022-08-06 NOTE — ED Notes (Signed)
[name] called and advised she would come pick patient up and she would be on her way now.  Currently awaiting patient's mother to come get him.

## 2022-08-06 NOTE — ED Notes (Signed)
Pt brought to desk via bed to make a phone call

## 2022-08-06 NOTE — ED Notes (Signed)
Paperwork rescinded copy made original in the the red folder

## 2022-08-06 NOTE — ED Notes (Signed)
Patient updated per request that he is 8th on the list for [facility].  Patient is irritable now, requesting someone to take him to the vending machine.  Patient made aware that he could not go to the vending machine at this time but this writer would be able to get him a snack.  Patient given a sprite per request.

## 2022-08-06 NOTE — ED Notes (Signed)
Pt becoming verbally aggressive about not being able to have his cell phones despite staff educating pt.

## 2022-08-06 NOTE — ED Notes (Signed)
Ptar called 
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
## 2022-08-06 NOTE — ED Notes (Signed)
Patient lying in bed, quiet and wake, watching TV.  Patient is still currently awaiting [facility] transport.

## 2022-08-06 NOTE — TOC CAGE-AID Note (Signed)
Transition of Care [facility]) - CAGE-AID Screening   Patient Details  Name: Luke Velasquez MRN: [id number] Date of Birth: September 16, 2000  [facility] of [facility]) CM/SW Contact:     C Tarpley-Carter, LCSWA Phone Number: 08/06/2022, 5:37 PM   Clinical Narrative: Pt participated in [place].  Pt stated he does use substance.  Pt was offered resources, due to usage of substance.   CSW provided resources.   Tarpley-Carter, MSW, LCSW-A Pronouns:  She/Her/Hers [facility]itions [facility] Clinical Social Worker Direct Number:  [phone] [email or web address]    CAGE-AID Screening:    Have You Ever Felt You Ought to Cut Down on Your Drinking or Drug Use?: No Have People Annoyed You By [employer] Your Drinking Or Drug Use?: No Have You Felt Bad Or Guilty About Your Drinking Or Drug Use?: No Have You Ever Had a Drink or Used Drugs First Thing In The Morning to Steady Your Nerves or to Get Rid of a Hangover?: No CAGE-AID Score: 0  Substance Abuse Education Offered: Yes  Substance abuse interventions: Scientist, clinical (histocompatibility and immunogenetics)

## 2022-08-09 IMAGING — US US SCROTUM W/ DOPPLER COMPLETE
1 series · 13 of 25 positions shown · non-contrast
Comparison: None.

CLINICAL DATA: Testicular swelling.  Rule out torsion.

EXAM:
SCROTAL ULTRASOUND
DOPPLER ULTRASOUND OF THE TESTICLES
TECHNIQUE: Complete ultrasound examination of the testicles, epididymis, and
other scrotal structures was performed. Color and spectral Doppler
ultrasound were also utilized to evaluate blood flow to the
testicles.

[Series 1: us scrotum · 45 acquisitions, 13 frames shown]
[im 1/45]
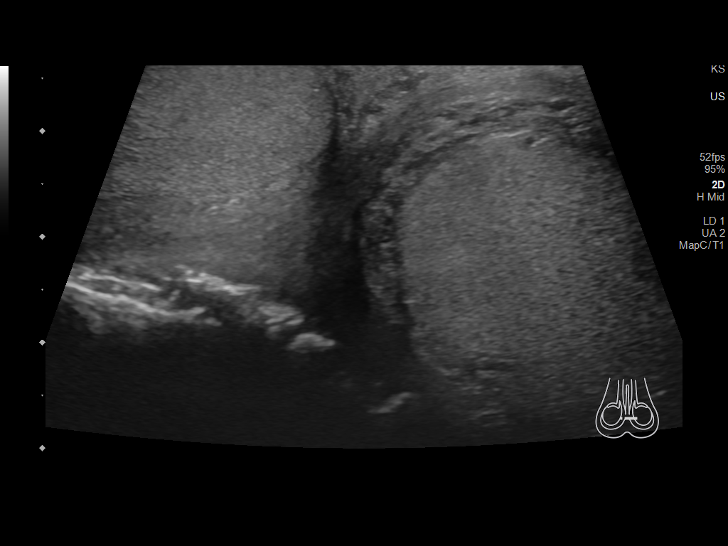
[im 4/45]
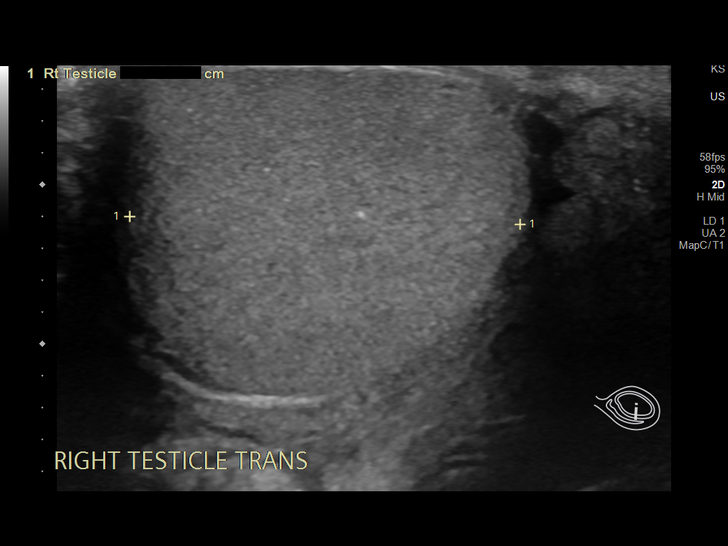
[im 8/45]
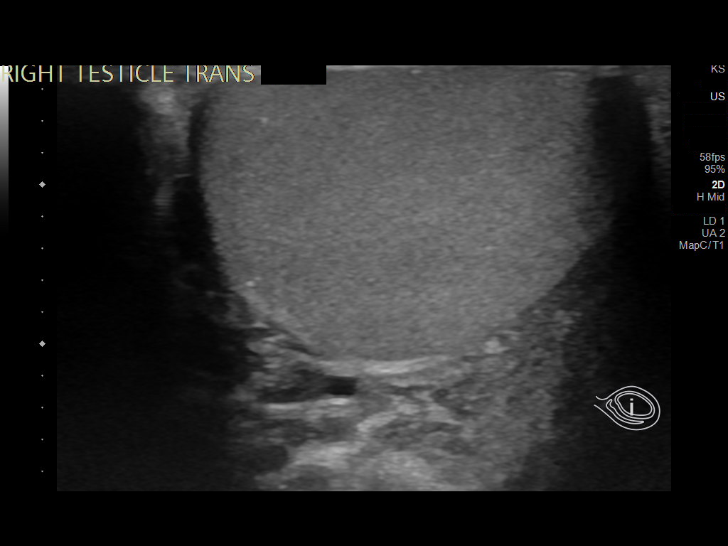
[im 12/45]
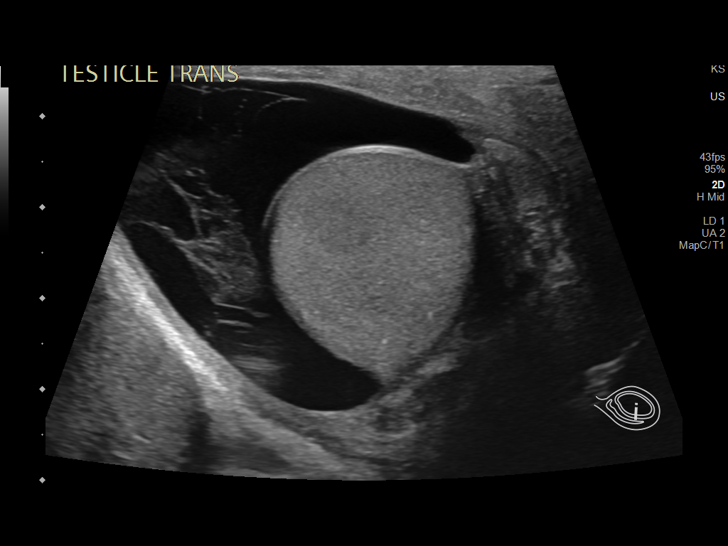
[im 15/45]
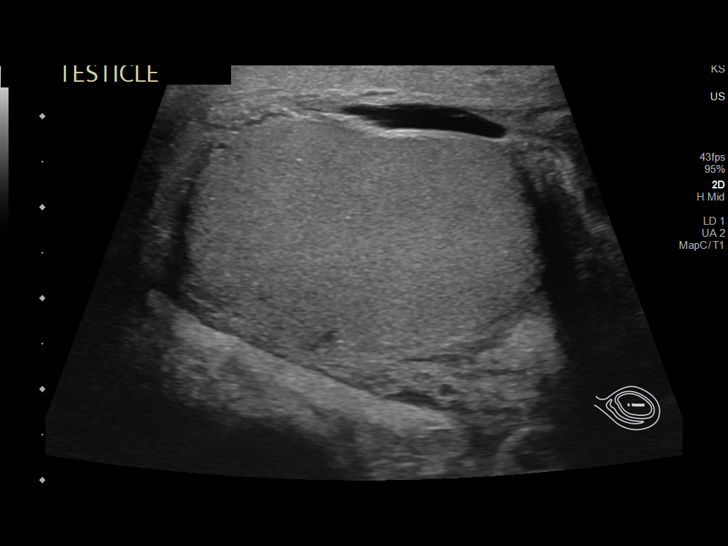
[im 19/45]
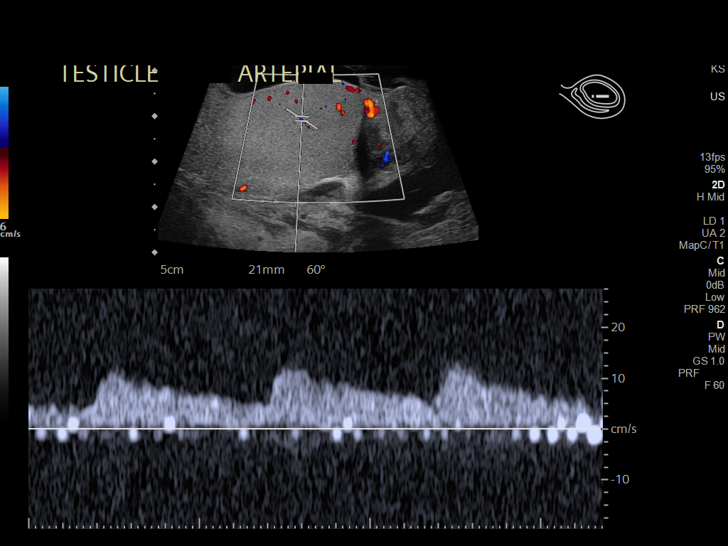
[im 23/45]
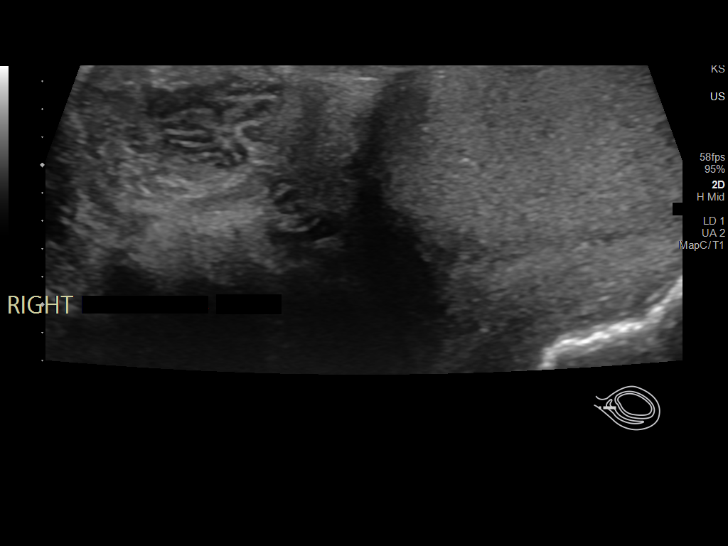
[im 26/45]
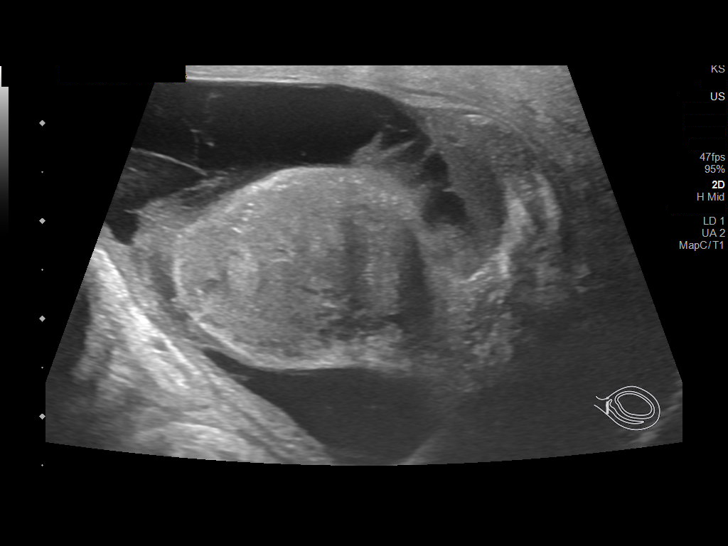
[im 30/45]
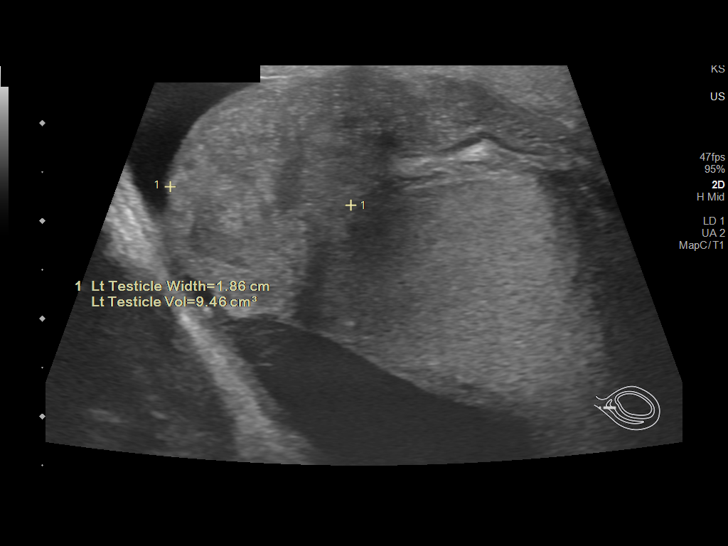
[im 34/45]
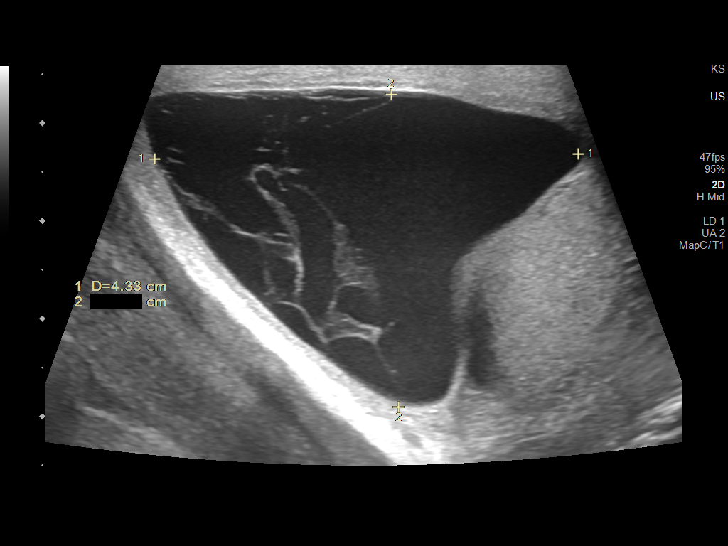
[im 37/45]
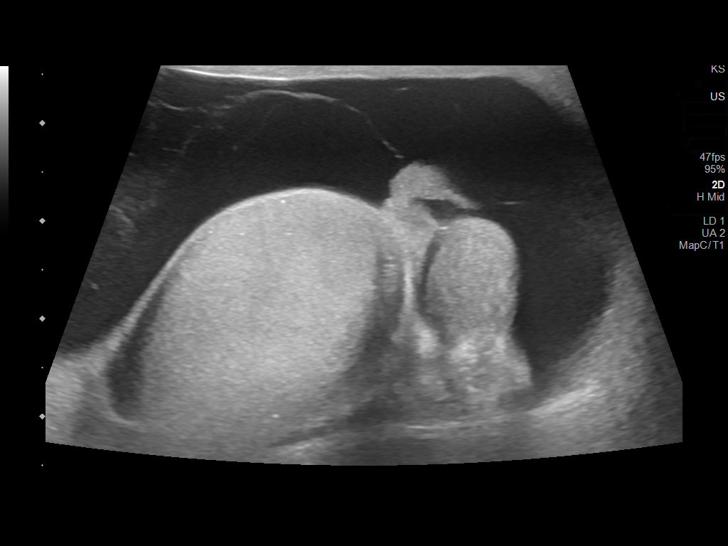
[im 41/45]
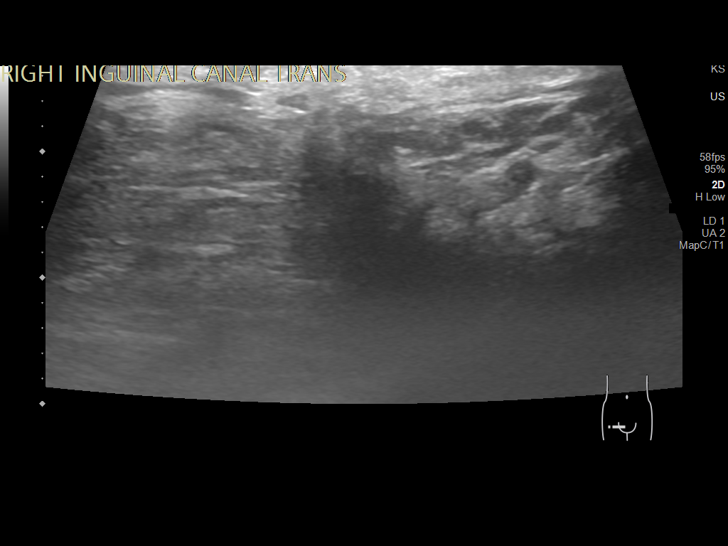
[im 45/45]
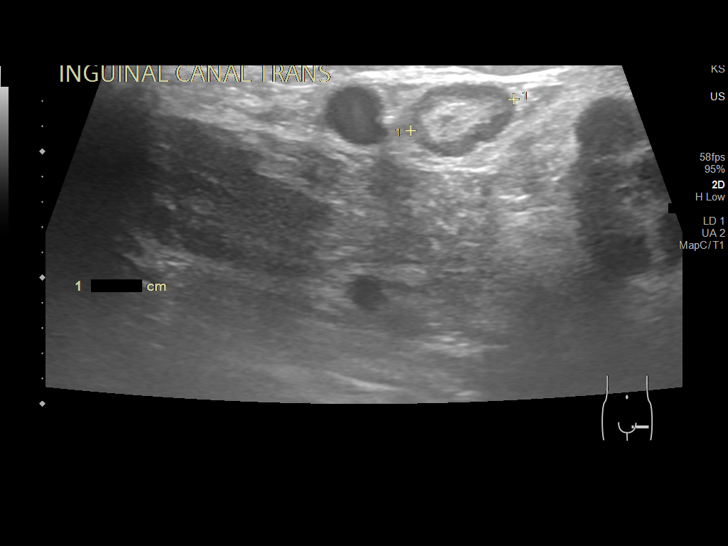

[13 of 25 positions shown; findings below may reference images not displayed]

FINDINGS: Right testicle

Measurements: 2.5 x 3.4 x 1.9 cm, volume 8 mL. No mass or focal
lesion identified. Tiny echogenic foci consistent with
microcalcifications.

Left testicle

Measurements: 2.3 x 3.9 x 2.5 cm, volume 12 mL. No mass or focal
lesion identified. Tiny echogenic foci consistent with
microcalcifications.

Right epididymis:  Normal in size and appearance.

Left epididymis: Heterogeneous appearance of the left epididymis
with epididymal thickening and increased flow suggesting
epididymitis.

Hydrocele:  Moderate septated left hydrocele.

Varicocele:  None visualized.

Pulsed Doppler interrogation of both testes demonstrates normal low
resistance arterial and venous waveforms bilaterally. Color flow
Doppler images demonstrate symmetrical flow to both testes.

Other: Benign-appearing lymph nodes are demonstrated in the groin.
IMPRESSION: 1. No evidence of testicular mass or torsion.
2. Heterogeneous thickened appearance of the left epididymis with
increased flow suggesting epididymitis.
3. Moderate left scrotal hydrocele, septated.
4. Microcalcifications demonstrated in the testes. Current
literature suggests that testicular microlithiasis is not a
significant independent risk factor for development of testicular
carcinoma, and that follow up imaging is not warranted in the
absence of other risk factors. Monthly testicular self-examination
and annual physical exams are considered appropriate surveillance.
If patient has other risk factors for testicular carcinoma, then
referral to Urology should be considered. (Reference: Zearmal, et
al.: A 5-Year Follow up Study of Asymptomatic Men with Testicular
Microlithiasis. J Urol 3001; [id number].)

## 2022-12-06 ENCOUNTER — Encounter (HOSPITAL_COMMUNITY): Payer: Self-pay

## 2022-12-06 ENCOUNTER — Emergency Department (HOSPITAL_COMMUNITY)
Admission: EM | Admit: 2022-12-06 | Discharge: 2022-12-07 | Disposition: A | Discharge status: Home / Self Care | Payer: Medicaid Other | Attending: Emergency Medicine | Admitting: Emergency Medicine

## 2022-12-06 ENCOUNTER — Other Ambulatory Visit: Payer: Self-pay

## 2022-12-06 ENCOUNTER — Emergency Department (HOSPITAL_COMMUNITY): Payer: Medicaid Other

## 2022-12-06 DIAGNOSIS — M25551 Pain in right hip: Secondary | ICD-10-CM | POA: Diagnosis present

## 2022-12-06 DIAGNOSIS — F29 Unspecified psychosis not due to a substance or known physiological condition: Secondary | ICD-10-CM | POA: Diagnosis not present

## 2022-12-06 DIAGNOSIS — X501XXA Overexertion from prolonged static or awkward postures, initial encounter: Secondary | ICD-10-CM | POA: Diagnosis not present

## 2022-12-06 DIAGNOSIS — F4310 Post-traumatic stress disorder, unspecified: Secondary | ICD-10-CM | POA: Insufficient documentation

## 2022-12-06 DIAGNOSIS — F17290 Nicotine dependence, other tobacco product, uncomplicated: Secondary | ICD-10-CM | POA: Diagnosis not present

## 2022-12-06 DIAGNOSIS — R4182 Altered mental status, unspecified: Secondary | ICD-10-CM | POA: Insufficient documentation

## 2022-12-06 DIAGNOSIS — G8220 Paraplegia, unspecified: Secondary | ICD-10-CM | POA: Diagnosis present

## 2022-12-06 LAB — CBC WITH DIFFERENTIAL/PLATELET
Abs Immature Granulocytes: 0.01 10*3/uL (ref 0.00–0.07)
Basophils Absolute: 0 10*3/uL (ref 0.0–0.1)
Basophils Relative: 0 %
Eosinophils Absolute: 0.1 10*3/uL (ref 0.0–0.5)
Eosinophils Relative: 2 %
HCT: 39.8 % (ref 39.0–52.0)
Hemoglobin: 12.2 g/dL — ABNORMAL LOW (ref 13.0–17.0)
Immature Granulocytes: 0 %
Lymphocytes Relative: 34 %
Lymphs Abs: 1.8 10*3/uL (ref 0.7–4.0)
MCH: 25.4 pg — ABNORMAL LOW (ref 26.0–34.0)
MCHC: 30.7 g/dL (ref 30.0–36.0)
MCV: 82.9 fL (ref 80.0–100.0)
Monocytes Absolute: 0.4 10*3/uL (ref 0.1–1.0)
Monocytes Relative: 7 %
Neutro Abs: 3 10*3/uL (ref 1.7–7.7)
Neutrophils Relative %: 57 %
Platelets: 330 10*3/uL (ref 150–400)
RBC: 4.8 MIL/uL (ref 4.22–5.81)
RDW: 13.8 % (ref 11.5–15.5)
WBC: 5.3 10*3/uL (ref 4.0–10.5)
nRBC: 0 % (ref 0.0–0.2)

## 2022-12-06 LAB — COMPREHENSIVE METABOLIC PANEL
ALT: 17 U/L (ref 0–44)
AST: 17 U/L (ref 15–41)
Albumin: 3.3 g/dL — ABNORMAL LOW (ref 3.5–5.0)
Alkaline Phosphatase: 116 U/L (ref 38–126)
Anion gap: 10 (ref 5–15)
BUN: 9 mg/dL (ref 6–20)
CO2: 25 mmol/L (ref 22–32)
Calcium: 9.4 mg/dL (ref 8.9–10.3)
Chloride: 103 mmol/L (ref 98–111)
Creatinine, Ser: 0.83 mg/dL (ref 0.61–1.24)
GFR, Estimated: >60 mL/min (ref >60)
Glucose, Bld: 84 mg/dL (ref 70–99)
Potassium: 3.6 mmol/L (ref 3.5–5.1)
Sodium: 138 mmol/L (ref 135–145)
Total Bilirubin: 1 mg/dL (ref 0.3–1.2)
Total Protein: 6.9 g/dL (ref 6.5–8.1)

## 2022-12-06 LAB — ETHANOL: Alcohol, Ethyl (B): <10 mg/dL (ref <10)

## 2022-12-06 LAB — SALICYLATE LEVEL: Salicylate Lvl: <7 mg/dL — ABNORMAL LOW (ref 7.0–30.0)

## 2022-12-06 LAB — ACETAMINOPHEN LEVEL: Acetaminophen (Tylenol), Serum: 12 ug/mL (ref 10–30)

## 2022-12-06 MED ORDER — CYCLOBENZAPRINE HCL 10 MG PO TABS
5.0000 mg | ORAL_TABLET | Freq: Once | ORAL | Status: AC
  Administered 2022-12-06: 5 mg via ORAL
  Filled 2022-12-06: qty 1

## 2022-12-06 MED ORDER — OXYCODONE-ACETAMINOPHEN 5-325 MG PO TABS: 1.0000 | ORAL_TABLET | Freq: Once | ORAL | Status: DC

## 2022-12-06 MED ORDER — FENTANYL CITRATE PF 50 MCG/ML IJ SOSY
50.0000 ug | PREFILLED_SYRINGE | Freq: Once | INTRAMUSCULAR | Status: DC
  Filled 2022-12-06: qty 1

## 2022-12-06 MED ORDER — OLANZAPINE 5 MG PO TABS
5.0000 mg | ORAL_TABLET | Freq: Every day | ORAL | Status: DC
  Filled 2022-12-06: qty 1

## 2022-12-06 MED ORDER — CYCLOBENZAPRINE HCL 5 MG PO TABS: 5.0000 mg | ORAL_TABLET | Freq: Two times a day (BID) | ORAL | 0 refills | Status: DC | PRN

## 2022-12-06 MED ORDER — ACETAMINOPHEN 325 MG PO TABS
650.0000 mg | ORAL_TABLET | Freq: Once | ORAL | Status: AC
  Administered 2022-12-06: 650 mg via ORAL
  Filled 2022-12-06: qty 2

## 2022-12-06 MED ORDER — FENTANYL CITRATE PF 50 MCG/ML IJ SOSY: 50.0000 ug | PREFILLED_SYRINGE | Freq: Once | INTRAMUSCULAR | Status: DC

## 2022-12-06 NOTE — ED Notes (Signed)
Dinner Tray given

## 2022-12-06 NOTE — ED Notes (Signed)
Pt provided with sandwich bag at this time and Ginger Ale.  Pt remains not very interactive with staff, no aggression or behavioral concerns noted.

## 2022-12-06 NOTE — Discharge Instructions (Addendum)

## 2022-12-06 NOTE — ED Notes (Signed)
Pt received for care at 1900.  Quietly resting in bed; respirations even and unlabored.  This RN introduced self to pt.  Pt not very interactive; dinner tray untouched on bedside table.  Bed in lowest position, wheels locked.  Environment safe and secure with all necessary precautions maintained.

## 2022-12-06 NOTE — ED Notes (Signed)
Pt not interested in upcoming Zyprexa at this time.  Pt asking about food, this RN informed pt that his tray had been sitting on his table for awhile so may not be as good anymore.  This RN further offered snack to pt.  Pt requesting 3 sandwich bags.  This RN explained that he could potentially offer a sandwich if there were some still available on unit but likely could not give 3 bags.  Pt agreeable to 1 sandwich bag if available.  Pt also requesting Ginger Ale.  This RN to attempt to obtain sandwich and Ginger Ale for pt and will offer Zyprexa again shortly.
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
## 2022-12-06 NOTE — ED Triage Notes (Signed)
Twisted wrong in bed, hurt right hip, patient reports he feels like it might be out of a socket. Normally gets around in a wheelchair.
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
## 2022-12-07 DIAGNOSIS — F4310 Post-traumatic stress disorder, unspecified: Secondary | ICD-10-CM | POA: Insufficient documentation

## 2022-12-07 DIAGNOSIS — F29 Unspecified psychosis not due to a substance or known physiological condition: Secondary | ICD-10-CM

## 2022-12-07 MED ORDER — OLANZAPINE 5 MG PO TABS: 5.0000 mg | ORAL_TABLET | Freq: Every day | ORAL | 0 refills | Status: DC

## 2022-12-07 NOTE — ED Notes (Signed)
Pt provided with Ginger Ale; denies further needs at this time.

## 2022-12-07 NOTE — ED Notes (Signed)
Pt given supplies to self cath as well as additional food

## 2022-12-07 NOTE — ED Notes (Signed)
Pt finished eating sandwich; still not interested in Zyprexa dose.  Pt denies further needs at this time.

## 2022-12-07 NOTE — Discharge Summary (Signed)
[facility] Psych ED Discharge  12/07/2022 10:41 AM Luke Velasquez  MRN:  [id number]  Principal Problem: PTSD (post-traumatic stress disorder) Discharge Diagnoses: Principal Problem:   PTSD (post-traumatic stress disorder) Active Problems:   Paraplegia (HCC)   Psychosis (HCC)  Clinical Impression:  Final diagnoses:  Right hip pain   Subjective:  Patient seen at [facility] ED for face-to-face psychiatric reevaluation.  Patient is laying in his bed, more cooperative and engaged in assessment today.  Patient states he is ready to go home.  He continues to deny any suicidal or homicidal ideations.  Continues to deny any auditory or visual hallucinations.  He denies problems with sleep or appetite.  He does appear more clear today in response to my questions with appropriate timing.  Does not appear to have any thought blocking this morning.  Spoke with RN and also reviewed charting.  Patient has not been witnessed responding to internal stimuli or talking to himself during hospital stay.  He slept well throughout the night, only woke up around 4 AM when the RN checked on him and he asked for a snack.  Patient did refuse Zyprexa dose last night, mother mentions that he will probably try and refuse since he does not like taking any medications not even his pain medications at home.  I spoke to his mother, Luke Velasquez again this morning.  Explained to her that the patient has not been witnessed responding to internal stimuli or speaking to himself.  There is no current evidence of psychosis or mania at this time.  We attempted to give Zyprexa however the patient refused in the hospital cannot force him to take this medication.  He has continued to contract for safety, denying SI/HI/AVH.  Mother is understanding, I spoke to her that the patient is needing therapy especially if his change in behaviors over the past few months is rooting from PTSD.  She is agreeable to this and does feel safe with him discharging home  today.  Will send a prescription for Zyprexa for him to try and take at home, explained to him today that if he is having any psychotic symptoms or difficulties with sleep this medication will help.  Mom also stated she will try and get him to take it.  Mom is requesting a call from social work, they are new to the [facility] area and is hoping to get more information on resources for him as a paraplegic and also questions about [facility].  A TOC order was placed to help assist this request.  Patient is requesting discharge, and he does not meet [place] IVC criteria or inpatient psychiatric treatment criteria. Will psychiatrically clear patient at this time. Mom will be here to pick him up around 1330. ED staff notified.   ED Assessment Time Calculation: Start Time: 1000 Stop Time: 1045 Total Time in Minutes (Assessment Completion): 45   Past Psychiatric History: PTSD  Past Medical History:  Past Medical History:  Diagnosis Date   DVT (deep venous thrombosis) (HCC)    History reviewed. No pertinent surgical history. Family History: History reviewed. No pertinent family history.  Social History:  Social History   Substance and Sexual Activity  Alcohol Use Not Currently     Social History   Substance and Sexual Activity  Drug Use Not Currently    Social History   Socioeconomic History   Marital status: Single    Spouse name: Not on file   Number of children: Not on file  Years of education: Not on file   Highest education level: Not on file  Occupational History   Not on file  Tobacco Use   Smoking status: Every Day    Packs/day: .5    Types: Cigarettes, E-cigarettes   Smokeless tobacco: Current  Vaping Use   Vaping Use: Every day   Substances: Nicotine, CBD  Substance and Sexual Activity   Alcohol use: Not Currently   Drug use: Not Currently   Sexual activity: Not on file  Other Topics Concern   Not on file  Social History Narrative   Not on file    Social Determinants of Health   Financial Resource Strain: Not on file  Food Insecurity: Not on file  Transportation Needs: Not on file  Physical Activity: Not on file  Stress: Not on file  Social Connections: Not on file    Tobacco Cessation:  A prescription for an FDA-approved tobacco cessation medication was offered at discharge and the patient refused  Current Medications: Current Facility-Administered Medications  Medication Dose Route Frequency Provider Last Rate Last Admin   OLANZapine (ZYPREXA) tablet 5 mg  5 mg Oral QHS Eligha Bridegroom, NP       Current Outpatient Medications  Medication Sig Dispense Refill   cyclobenzaprine (FLEXERIL) 5 MG tablet Take 1-2 tablets (5-10 mg total) by mouth 2 (two) times daily as needed for muscle spasms. 20 tablet 0   apixaban (ELIQUIS) 5 MG TABS tablet Take 5 mg by mouth 2 (two) times daily. (Patient not taking: Reported on 08/05/2022)     OLANZapine (ZYPREXA) 5 MG tablet Take 1 tablet (5 mg total) by mouth at bedtime. 30 tablet 0   PTA Medications: (Not in a hospital admission)   [place] Scale:  Flowsheet Row ED from 12/06/2022 in [facility] Emergency Department at [facility] ED from 08/05/2022 in [facility] Emergency Department at [facility] ED from 07/13/2021 in [facility] Emergency Department at [facility]  C-SSRS RISK CATEGORY No Risk No Risk No Risk      Psychiatric Specialty Exam: Presentation  General Appearance:  Appropriate for Environment  Eye Contact: Fair  Speech: Clear and Coherent  Speech Volume: Normal  Handedness:No data recorded  Mood and Affect  Mood: Euthymic  Affect: Flat   Thought Process  Thought Processes: Coherent  Descriptions of Associations:Intact  Orientation:Full (Time, Place and Person)  Thought Content:WDL  History of Schizophrenia/Schizoaffective disorder:No data recorded Duration of Psychotic Symptoms:No data  recorded Hallucinations:Hallucinations: None  Ideas of Reference:None  Suicidal Thoughts:Suicidal Thoughts: No  Homicidal Thoughts:Homicidal Thoughts: No   Sensorium  Memory: Immediate Fair; Recent Fair  Judgment: Fair  Insight: Fair   Executive Functions  Concentration: Good  Attention Span: Good  Recall: Good  Fund of Knowledge: Good  Language: Good   Psychomotor Activity  Psychomotor Activity: Psychomotor Activity: Other (comment)   Assets  Assets: Desire for Improvement; Leisure Time; Physical Health; Resilience; Social Support; Housing   Sleep  Sleep: Sleep: Good    Physical Exam: Physical Exam Neurological:     Mental Status: He is alert and oriented to person, place, and time.  Psychiatric:        Attention and Perception: Attention normal.        Mood and Affect: Affect is flat.        Speech: Speech normal.        Behavior: Behavior is cooperative.        Thought Content: Thought content normal.  Review of Systems  Psychiatric/Behavioral:  Positive for substance abuse.   All other systems reviewed and are negative.  Blood pressure 100/66, pulse 64, temperature 98 F (36.7 C), temperature source Oral, resp. rate 20, SpO2 100 %. There is no height or weight on file to calculate BMI.   Demographic Factors:  Male  Loss Factors: Decline in physical health  Historical Factors: Impulsivity  Risk Reduction Factors:   Sense of responsibility to family, Living with another person, especially a relative, Positive social support, Positive therapeutic relationship, and Positive coping skills or problem solving skills  Continued Clinical Symptoms:  Medical Diagnoses and Treatments/Surgeries  Cognitive Features That Contribute To Risk:  None    Suicide Risk:  Minimal: No identifiable suicidal ideation.  Patients presenting with no risk factors but with morbid ruminations; may be classified as minimal risk based on the severity  of the depressive symptoms   Follow-up Information     Schedule an appointment as soon as possible for a visit  with Your primary care physician.                  Plan Of Care/Follow-up recommendations:  Other:  Please follow up with therapy and OP provider  Medical Decision Making: Pt case reviewed and discussed with Dr. Lucianne Muss. Will psych clear patient at this time. Mother agreeable with plan, will be coming to pick up the patient around 1330.   - Resources provided in AVS -30 day supply Zyprexa 5 mg Qhs sent to preferred pharmacy  Disposition: psych cleared  Eligha Bridegroom, NP 12/07/2022, 10:41 AM

## 2022-12-07 NOTE — ED Notes (Signed)
Pt belonging returned to pt and mother

## 2022-12-07 NOTE — ED Provider Notes (Addendum)
Emergency Medicine Observation Re-evaluation Note  Maribel Pezzullo is a 22 y.o. male, seen on rounds today.  Pt initially presented to the ED for complaints of Hip Pain Currently, the patient is asleep resting comfortably.  Physical Exam  BP 100/66 (BP Location: Left Arm)   Pulse 64   Temp 98 F (36.7 C) (Oral)   Resp 20   SpO2 100%  Physical Exam General: Asleep, no acute distress Cardiac: Regular rate Lungs: No increased work of breathing Psych: Calm, asleep  ED Course / MDM  EKG:   I have reviewed the labs performed to date as well as medications administered while in observation.  Recent changes in the last 24 hours include seen by psychiatry and recommended overnight observation.  Recommended to start Zyprexa however patient refused overnight..  Plan  Current plan is for repeat psych eval this morning to determine disposition.  Clinical Course as of 12/07/22 1049  Tue Dec 07, 2022  1048 Patient psych cleared by Eligha Bridegroom NP with plans for mother to pick him up this afternoon at time of discharge. [VK]    Clinical Course User Index [VK] Rexford Maus, DO       Rexford Maus, [place] 12/07/22 1049

## 2022-12-07 NOTE — ED Notes (Signed)
Pt overheard yelling out.  This RN stepped into room to check on pt.  Pt requesting TV remote.  While this RN in room, pt further asking if we had anything to eat.  This RN explained that it was 4 AM and we really just had crackers and peanut butter at this time.  Pt states this works and provided with same at this time by this Char[employer] fundraiser.  Pt also given remote to change channels on TV.

## 2022-12-07 NOTE — Progress Notes (Signed)
CSW spoke with patients mother Luke [phone]. Ms. Luke Velasquez wanted to know how to apply for medicaid for her son and get him a care coordinator. CSW provided patients mother with the address to social services in [place] to apply for medicaid. CSW also reached out to the liaison with [facility] who told CSW that patient would need medicaid before they would accept the referral for care coordination services. Patient would also need to apply himself due to being his own guardian. Patients mother stated that is fine and she will work on the [employer] application.

## 2023-02-11 ENCOUNTER — Emergency Department (HOSPITAL_COMMUNITY): Payer: Managed Care, Other (non HMO)

## 2023-02-11 ENCOUNTER — Inpatient Hospital Stay (HOSPITAL_COMMUNITY)
Admission: EM | Admit: 2023-02-11 | Discharge: 2023-02-15 | DRG: 689 | Disposition: A | Discharge status: Home / Self Care | Payer: Managed Care, Other (non HMO) | Attending: Internal Medicine | Admitting: Internal Medicine

## 2023-02-11 ENCOUNTER — Other Ambulatory Visit: Payer: Self-pay

## 2023-02-11 DIAGNOSIS — Z91041 Radiographic dye allergy status: Secondary | ICD-10-CM

## 2023-02-11 DIAGNOSIS — D75839 Thrombocytosis, unspecified: Secondary | ICD-10-CM | POA: Diagnosis present

## 2023-02-11 DIAGNOSIS — Z993 Dependence on wheelchair: Secondary | ICD-10-CM

## 2023-02-11 DIAGNOSIS — N3000 Acute cystitis without hematuria: Principal | ICD-10-CM | POA: Diagnosis present

## 2023-02-11 DIAGNOSIS — E43 Unspecified severe protein-calorie malnutrition: Secondary | ICD-10-CM | POA: Insufficient documentation

## 2023-02-11 DIAGNOSIS — F17290 Nicotine dependence, other tobacco product, uncomplicated: Secondary | ICD-10-CM | POA: Diagnosis present

## 2023-02-11 DIAGNOSIS — Z7901 Long term (current) use of anticoagulants: Secondary | ICD-10-CM

## 2023-02-11 DIAGNOSIS — N189 Chronic kidney disease, unspecified: Secondary | ICD-10-CM | POA: Diagnosis present

## 2023-02-11 DIAGNOSIS — G8220 Paraplegia, unspecified: Secondary | ICD-10-CM | POA: Diagnosis present

## 2023-02-11 DIAGNOSIS — N390 Urinary tract infection, site not specified: Secondary | ICD-10-CM

## 2023-02-11 DIAGNOSIS — F17210 Nicotine dependence, cigarettes, uncomplicated: Secondary | ICD-10-CM | POA: Diagnosis present

## 2023-02-11 DIAGNOSIS — M869 Osteomyelitis, unspecified: Secondary | ICD-10-CM | POA: Diagnosis present

## 2023-02-11 DIAGNOSIS — E46 Unspecified protein-calorie malnutrition: Secondary | ICD-10-CM | POA: Diagnosis present

## 2023-02-11 DIAGNOSIS — L8990 Pressure ulcer of unspecified site, unspecified stage: Secondary | ICD-10-CM | POA: Insufficient documentation

## 2023-02-11 DIAGNOSIS — S31000A Unspecified open wound of lower back and pelvis without penetration into retroperitoneum, initial encounter: Secondary | ICD-10-CM

## 2023-02-11 DIAGNOSIS — B9620 Unspecified Escherichia coli [E. coli] as the cause of diseases classified elsewhere: Secondary | ICD-10-CM | POA: Diagnosis present

## 2023-02-11 DIAGNOSIS — L89153 Pressure ulcer of sacral region, stage 3: Secondary | ICD-10-CM | POA: Diagnosis present

## 2023-02-11 DIAGNOSIS — L89894 Pressure ulcer of other site, stage 4: Secondary | ICD-10-CM | POA: Diagnosis present

## 2023-02-11 DIAGNOSIS — T148XXA Other injury of unspecified body region, initial encounter: Secondary | ICD-10-CM | POA: Diagnosis present

## 2023-02-11 DIAGNOSIS — E162 Hypoglycemia, unspecified: Secondary | ICD-10-CM | POA: Diagnosis present

## 2023-02-11 DIAGNOSIS — Z681 Body mass index (BMI) 19 or less, adult: Secondary | ICD-10-CM

## 2023-02-11 DIAGNOSIS — Z933 Colostomy status: Secondary | ICD-10-CM

## 2023-02-11 DIAGNOSIS — S31829A Unspecified open wound of left buttock, initial encounter: Secondary | ICD-10-CM

## 2023-02-11 DIAGNOSIS — M8619 Other acute osteomyelitis, multiple sites: Principal | ICD-10-CM

## 2023-02-11 DIAGNOSIS — D631 Anemia in chronic kidney disease: Secondary | ICD-10-CM | POA: Diagnosis present

## 2023-02-11 DIAGNOSIS — J45909 Unspecified asthma, uncomplicated: Secondary | ICD-10-CM | POA: Diagnosis present

## 2023-02-11 DIAGNOSIS — Z86718 Personal history of other venous thrombosis and embolism: Secondary | ICD-10-CM

## 2023-02-11 HISTORY — DX: Neuromuscular dysfunction of bladder, unspecified: N31.9

## 2023-02-11 HISTORY — DX: Calculus of kidney: N20.0

## 2023-02-11 HISTORY — DX: Acquired absence of kidney: Z90.5

## 2023-02-11 HISTORY — DX: Accidental discharge from unspecified firearms or gun, initial encounter: W34.00XA

## 2023-02-11 HISTORY — DX: Colostomy status: Z93.3

## 2023-02-11 HISTORY — DX: Male erectile dysfunction, unspecified: N52.9

## 2023-02-11 HISTORY — DX: Dependence on wheelchair: Z99.3

## 2023-02-11 HISTORY — DX: Paraplegia, unspecified: G82.20

## 2023-02-11 HISTORY — DX: Other specified diseases of jaws: M27.8

## 2023-02-11 HISTORY — DX: Unspecified firearm discharge, undetermined intent, initial encounter: Y24.9XXA

## 2023-02-11 LAB — URINALYSIS, ROUTINE W REFLEX MICROSCOPIC
Bilirubin Urine: NEGATIVE
Glucose, UA: NEGATIVE mg/dL
Hgb urine dipstick: NEGATIVE
Ketones, ur: NEGATIVE mg/dL
Nitrite: POSITIVE — AB
Protein, ur: NEGATIVE mg/dL
Specific Gravity, Urine: 1.019 (ref 1.005–1.030)
WBC, UA: >50 WBC/hpf (ref 0–5)
pH: 6 (ref 5.0–8.0)

## 2023-02-11 LAB — COMPREHENSIVE METABOLIC PANEL
ALT: 16 U/L (ref 0–44)
AST: 16 U/L (ref 15–41)
Albumin: 3.4 g/dL — ABNORMAL LOW (ref 3.5–5.0)
Alkaline Phosphatase: 155 U/L — ABNORMAL HIGH (ref 38–126)
Anion gap: 10 (ref 5–15)
BUN: 13 mg/dL (ref 6–20)
CO2: 26 mmol/L (ref 22–32)
Calcium: 8.9 mg/dL (ref 8.9–10.3)
Chloride: 100 mmol/L (ref 98–111)
Creatinine, Ser: 0.65 mg/dL (ref 0.61–1.24)
GFR, Estimated: >60 mL/min (ref >60)
Glucose, Bld: 57 mg/dL — ABNORMAL LOW (ref 70–99)
Potassium: 3.5 mmol/L (ref 3.5–5.1)
Sodium: 136 mmol/L (ref 135–145)
Total Bilirubin: 0.3 mg/dL (ref 0.3–1.2)
Total Protein: 8.6 g/dL — ABNORMAL HIGH (ref 6.5–8.1)

## 2023-02-11 LAB — CBC WITH DIFFERENTIAL/PLATELET
Abs Immature Granulocytes: 0.04 10*3/uL (ref 0.00–0.07)
Basophils Absolute: 0 10*3/uL (ref 0.0–0.1)
Basophils Relative: 0 %
Eosinophils Absolute: 0.1 10*3/uL (ref 0.0–0.5)
Eosinophils Relative: 1 %
HCT: 36 % — ABNORMAL LOW (ref 39.0–52.0)
Hemoglobin: 10.9 g/dL — ABNORMAL LOW (ref 13.0–17.0)
Immature Granulocytes: 0 %
Lymphocytes Relative: 13 %
Lymphs Abs: 1.5 10*3/uL (ref 0.7–4.0)
MCH: 25.8 pg — ABNORMAL LOW (ref 26.0–34.0)
MCHC: 30.3 g/dL (ref 30.0–36.0)
MCV: 85.3 fL (ref 80.0–100.0)
Monocytes Absolute: 0.7 10*3/uL (ref 0.1–1.0)
Monocytes Relative: 6 %
Neutro Abs: 8.9 10*3/uL — ABNORMAL HIGH (ref 1.7–7.7)
Neutrophils Relative %: 80 %
Platelets: 654 10*3/uL — ABNORMAL HIGH (ref 150–400)
RBC: 4.22 MIL/uL (ref 4.22–5.81)
RDW: 19.1 % — ABNORMAL HIGH (ref 11.5–15.5)
WBC: 11.2 10*3/uL — ABNORMAL HIGH (ref 4.0–10.5)
nRBC: 0 % (ref 0.0–0.2)

## 2023-02-11 LAB — LACTIC ACID, PLASMA
Lactic Acid, Venous: 1.6 mmol/L (ref 0.5–1.9)
Lactic Acid, Venous: 1.1 mmol/L (ref 0.5–1.9)

## 2023-02-11 LAB — CBG MONITORING, ED: Glucose-Capillary: 125 mg/dL — ABNORMAL HIGH (ref 70–99)

## 2023-02-11 MED ORDER — DIPHENHYDRAMINE HCL 50 MG/ML IJ SOLN
50.0000 mg | Freq: Once | INTRAMUSCULAR | Status: AC
  Administered 2023-02-12: 50 mg via INTRAVENOUS
  Filled 2023-02-11: qty 1

## 2023-02-11 MED ORDER — METHYLPREDNISOLONE SODIUM SUCC 40 MG IJ SOLR
40.0000 mg | Freq: Once | INTRAMUSCULAR | Status: AC
  Administered 2023-02-11: 40 mg via INTRAVENOUS
  Filled 2023-02-11: qty 1

## 2023-02-11 MED ORDER — DIPHENHYDRAMINE HCL 25 MG PO CAPS: 50.0000 mg | ORAL_CAPSULE | Freq: Once | ORAL | Status: AC

## 2023-02-11 MED ORDER — SODIUM CHLORIDE 0.9 % IV SOLN
1.0000 g | Freq: Once | INTRAVENOUS | Status: AC
  Administered 2023-02-11: 1 g via INTRAVENOUS
  Filled 2023-02-11: qty 10
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
## 2023-02-11 NOTE — ED Notes (Signed)
Pt was given a urine cup   
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
## 2023-02-11 NOTE — ED Provider Triage Note (Signed)
Emergency Medicine Provider Triage Evaluation Note  Luke Velasquez , a 22 y.o. male  was evaluated in triage.  Pt complains of sacral wounds, worsening. Wheelchair bound. Seen by Deboraha Sprang today with worsening wounds to bone. Rec come here for eval for poss osteo  Review of Systems  Positive: wounds Negative:   Physical Exam  BP 111/71 (BP Location: Right Arm)   Pulse (!) 112   Temp 98.4 F (36.9 C) (Oral)   Resp 18   Ht 6\' 4"  (1.93 m)   Wt 68 kg   SpO2 100%   BMI 18.26 kg/m  Gen:   Awake, no distress   Resp:  Normal effort  MSK:   Wheelchair bound Other:    Medical Decision Making  Medically screening exam initiated at 5:45 PM.  Appropriate orders placed.  Luke Velasquez was informed that the remainder of the evaluation will be completed by another provider, this initial triage assessment does not replace that evaluation, and the importance of remaining in the ED until their evaluation is complete.  Sacral wounds   ,  A, PA-C 02/11/23 1746

## 2023-02-11 NOTE — ED Triage Notes (Signed)
Pt c/o x2 wounds on his lower tailbone area and lt buttock area he has been dealing with 4 and 1 months respectively. Has been seeing wound care without improvement. Report his lt buttock wound is to his bone. Pt is baseline wheelchair bound.

## 2023-02-12 ENCOUNTER — Emergency Department (HOSPITAL_COMMUNITY): Payer: Managed Care, Other (non HMO)

## 2023-02-12 ENCOUNTER — Encounter (HOSPITAL_COMMUNITY): Payer: Self-pay | Admitting: Internal Medicine

## 2023-02-12 DIAGNOSIS — M8619 Other acute osteomyelitis, multiple sites: Secondary | ICD-10-CM

## 2023-02-12 DIAGNOSIS — Z7901 Long term (current) use of anticoagulants: Secondary | ICD-10-CM | POA: Diagnosis not present

## 2023-02-12 DIAGNOSIS — N189 Chronic kidney disease, unspecified: Secondary | ICD-10-CM | POA: Diagnosis present

## 2023-02-12 DIAGNOSIS — M8668 Other chronic osteomyelitis, other site: Secondary | ICD-10-CM

## 2023-02-12 DIAGNOSIS — J45909 Unspecified asthma, uncomplicated: Secondary | ICD-10-CM | POA: Diagnosis present

## 2023-02-12 DIAGNOSIS — L98429 Non-pressure chronic ulcer of back with unspecified severity: Secondary | ICD-10-CM | POA: Diagnosis not present

## 2023-02-12 DIAGNOSIS — E162 Hypoglycemia, unspecified: Secondary | ICD-10-CM | POA: Diagnosis present

## 2023-02-12 DIAGNOSIS — N3000 Acute cystitis without hematuria: Secondary | ICD-10-CM | POA: Diagnosis present

## 2023-02-12 DIAGNOSIS — T148XXA Other injury of unspecified body region, initial encounter: Secondary | ICD-10-CM | POA: Diagnosis not present

## 2023-02-12 DIAGNOSIS — F17210 Nicotine dependence, cigarettes, uncomplicated: Secondary | ICD-10-CM | POA: Diagnosis present

## 2023-02-12 DIAGNOSIS — L89153 Pressure ulcer of sacral region, stage 3: Secondary | ICD-10-CM | POA: Diagnosis present

## 2023-02-12 DIAGNOSIS — L89894 Pressure ulcer of other site, stage 4: Secondary | ICD-10-CM | POA: Diagnosis present

## 2023-02-12 DIAGNOSIS — B9620 Unspecified Escherichia coli [E. coli] as the cause of diseases classified elsewhere: Secondary | ICD-10-CM | POA: Diagnosis present

## 2023-02-12 DIAGNOSIS — Z86718 Personal history of other venous thrombosis and embolism: Secondary | ICD-10-CM | POA: Diagnosis not present

## 2023-02-12 DIAGNOSIS — G8220 Paraplegia, unspecified: Secondary | ICD-10-CM | POA: Diagnosis present

## 2023-02-12 DIAGNOSIS — M869 Osteomyelitis, unspecified: Secondary | ICD-10-CM | POA: Diagnosis present

## 2023-02-12 DIAGNOSIS — Z933 Colostomy status: Secondary | ICD-10-CM | POA: Diagnosis not present

## 2023-02-12 DIAGNOSIS — D631 Anemia in chronic kidney disease: Secondary | ICD-10-CM | POA: Diagnosis present

## 2023-02-12 DIAGNOSIS — F17290 Nicotine dependence, other tobacco product, uncomplicated: Secondary | ICD-10-CM | POA: Diagnosis present

## 2023-02-12 DIAGNOSIS — D75839 Thrombocytosis, unspecified: Secondary | ICD-10-CM | POA: Diagnosis present

## 2023-02-12 DIAGNOSIS — E46 Unspecified protein-calorie malnutrition: Secondary | ICD-10-CM | POA: Diagnosis present

## 2023-02-12 DIAGNOSIS — Z91041 Radiographic dye allergy status: Secondary | ICD-10-CM | POA: Diagnosis not present

## 2023-02-12 DIAGNOSIS — Z993 Dependence on wheelchair: Secondary | ICD-10-CM | POA: Diagnosis not present

## 2023-02-12 DIAGNOSIS — Z681 Body mass index (BMI) 19 or less, adult: Secondary | ICD-10-CM | POA: Diagnosis not present

## 2023-02-12 LAB — COMPREHENSIVE METABOLIC PANEL
ALT: 14 U/L (ref 0–44)
AST: 13 U/L — ABNORMAL LOW (ref 15–41)
Albumin: 2.5 g/dL — ABNORMAL LOW (ref 3.5–5.0)
Alkaline Phosphatase: 117 U/L (ref 38–126)
Anion gap: 10 (ref 5–15)
BUN: 12 mg/dL (ref 6–20)
CO2: 24 mmol/L (ref 22–32)
Calcium: 8.9 mg/dL (ref 8.9–10.3)
Chloride: 102 mmol/L (ref 98–111)
Creatinine, Ser: 0.53 mg/dL — ABNORMAL LOW (ref 0.61–1.24)
GFR, Estimated: >60 mL/min (ref >60)
Glucose, Bld: 132 mg/dL — ABNORMAL HIGH (ref 70–99)
Potassium: 4 mmol/L (ref 3.5–5.1)
Sodium: 136 mmol/L (ref 135–145)
Total Bilirubin: 0.3 mg/dL (ref 0.3–1.2)
Total Protein: 7 g/dL (ref 6.5–8.1)

## 2023-02-12 LAB — PHOSPHORUS: Phosphorus: 3.4 mg/dL (ref 2.5–4.6)

## 2023-02-12 LAB — CBC
HCT: 30.4 % — ABNORMAL LOW (ref 39.0–52.0)
Hemoglobin: 9 g/dL — ABNORMAL LOW (ref 13.0–17.0)
MCH: 25.3 pg — ABNORMAL LOW (ref 26.0–34.0)
MCHC: 29.6 g/dL — ABNORMAL LOW (ref 30.0–36.0)
MCV: 85.4 fL (ref 80.0–100.0)
Platelets: 456 10*3/uL — ABNORMAL HIGH (ref 150–400)
RBC: 3.56 MIL/uL — ABNORMAL LOW (ref 4.22–5.81)
RDW: 18.7 % — ABNORMAL HIGH (ref 11.5–15.5)
WBC: 9.4 10*3/uL (ref 4.0–10.5)
nRBC: 0 % (ref 0.0–0.2)

## 2023-02-12 LAB — MAGNESIUM: Magnesium: 2 mg/dL (ref 1.7–2.4)

## 2023-02-12 LAB — HIV ANTIBODY (ROUTINE TESTING W REFLEX): HIV Screen 4th Generation wRfx: NONREACTIVE

## 2023-02-12 LAB — MRSA NEXT GEN BY PCR, NASAL: MRSA by PCR Next Gen: NOT DETECTED

## 2023-02-12 MED ORDER — DIPHENHYDRAMINE HCL 50 MG/ML IJ SOLN: 50.0000 mg | Freq: Once | INTRAMUSCULAR | Status: DC

## 2023-02-12 MED ORDER — VANCOMYCIN HCL 1500 MG/300ML IV SOLN
1500.0000 mg | Freq: Two times a day (BID) | INTRAVENOUS | Status: DC
  Filled 2023-02-12: qty 300

## 2023-02-12 MED ORDER — ORAL CARE MOUTH RINSE: 15.0000 mL | OROMUCOSAL | Status: DC | PRN

## 2023-02-12 MED ORDER — LACTATED RINGERS IV SOLN: INTRAVENOUS | Status: DC

## 2023-02-12 MED ORDER — IOHEXOL 300 MG/ML  SOLN
100.0000 mL | Freq: Once | INTRAMUSCULAR | Status: AC | PRN
  Administered 2023-02-12: 100 mL via INTRAVENOUS

## 2023-02-12 MED ORDER — PROCHLORPERAZINE EDISYLATE 10 MG/2ML IJ SOLN: 5.0000 mg | Freq: Four times a day (QID) | INTRAMUSCULAR | Status: DC | PRN

## 2023-02-12 MED ORDER — VANCOMYCIN HCL IN DEXTROSE 1-5 GM/200ML-% IV SOLN
1000.0000 mg | Freq: Once | INTRAVENOUS | Status: AC
  Administered 2023-02-12: 1000 mg via INTRAVENOUS
  Filled 2023-02-12: qty 200

## 2023-02-12 MED ORDER — VANCOMYCIN HCL IN DEXTROSE 1-5 GM/200ML-% IV SOLN
1000.0000 mg | Freq: Two times a day (BID) | INTRAVENOUS | Status: DC
  Administered 2023-02-12: 1000 mg via INTRAVENOUS
  Filled 2023-02-12: qty 200

## 2023-02-12 MED ORDER — POLYETHYLENE GLYCOL 3350 17 G PO PACK: 17.0000 g | PACK | Freq: Every day | ORAL | Status: DC | PRN

## 2023-02-12 MED ORDER — SODIUM CHLORIDE 0.9 % IV SOLN: INTRAVENOUS | Status: AC

## 2023-02-12 MED ORDER — SODIUM CHLORIDE (PF) 0.9 % IJ SOLN
INTRAMUSCULAR | Status: AC
  Filled 2023-02-12: qty 50

## 2023-02-12 MED ORDER — DIPHENHYDRAMINE HCL 25 MG PO CAPS: 50.0000 mg | ORAL_CAPSULE | Freq: Once | ORAL | Status: DC

## 2023-02-12 MED ORDER — MELATONIN 5 MG PO TABS: 5.0000 mg | ORAL_TABLET | Freq: Every evening | ORAL | Status: DC | PRN

## 2023-02-12 MED ORDER — ACETAMINOPHEN 325 MG PO TABS: 650.0000 mg | ORAL_TABLET | Freq: Four times a day (QID) | ORAL | Status: DC | PRN

## 2023-02-12 MED ORDER — METHYLPREDNISOLONE SODIUM SUCC 40 MG IJ SOLR: 40.0000 mg | Freq: Once | INTRAMUSCULAR | Status: DC

## 2023-02-12 MED ORDER — ENOXAPARIN SODIUM 40 MG/0.4ML IJ SOSY
40.0000 mg | PREFILLED_SYRINGE | INTRAMUSCULAR | Status: DC
  Administered 2023-02-12 – 2023-02-14 (×3): 40 mg via SUBCUTANEOUS
  Filled 2023-02-12 (×3): qty 0.4

## 2023-02-12 MED ORDER — DEXTROSE 50 % IV SOLN: 50.0000 mL | INTRAVENOUS | Status: DC | PRN

## 2023-02-12 MED ORDER — PIPERACILLIN-TAZOBACTAM 3.375 G IVPB
3.3750 g | Freq: Three times a day (TID) | INTRAVENOUS | Status: DC
  Administered 2023-02-12 (×2): 3.375 g via INTRAVENOUS
  Filled 2023-02-12 (×2): qty 50
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
## 2023-02-12 NOTE — ED Notes (Signed)
Zosyn is running at this time, LR infusion not started due to incompatibility with zosyn.
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
## 2023-02-13 DIAGNOSIS — M869 Osteomyelitis, unspecified: Secondary | ICD-10-CM | POA: Diagnosis not present

## 2023-02-13 LAB — CREATININE, SERUM
Creatinine, Ser: 0.85 mg/dL (ref 0.61–1.24)
GFR, Estimated: >60 mL/min (ref >60)
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
## 2023-02-13 NOTE — Plan of Care (Signed)
  Problem: Clinical Measurements: Goal: Ability to maintain clinical measurements within normal limits will improve Outcome: Progressing Goal: Will remain free from infection Outcome: Progressing Goal: Diagnostic test results will improve Outcome: Progressing Goal: Cardiovascular complication will be avoided Outcome: Progressing   Problem: Coping: Goal: Level of anxiety will decrease Outcome: Adequate for Discharge   Problem: Safety: Goal: Ability to remain free from injury will improve Outcome: Progressing   Problem: Skin Integrity: Goal: Risk for impaired skin integrity will decrease Outcome: Progressing

## 2023-02-13 NOTE — Consult Note (Signed)
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 Wounds are clean. No indication for hydrotherapy at this time. Patient was being seen by the outpatient wound care center at [facility] and he stopped going approximately 1 month ago. Recommend he get reestablished with the outpatient WCC. Wound type:pressure Pressure Injury POA: Yes Measurement:Per Nursing Flow Sheet by Wound Treatment Associate J. Ladona Ridgel. Sacrum: Stage 3, 7cm x 5cm x 4cm with 95% red tissue and 5 % yellow tissue. Left IT: 4.5cm x 3.5cm x 5cm with 75% red tissue, 25% yellow tissue Wound bed:As described above and also as noted in photodocumentation taken by Provider and uploaded to EMR. Drainage (amount, consistency, odor) Moderate, serous Periwound: intact with erythema Dressing procedure/placement/frequency: I have provided guidance for daily care using placement on a mattress replacement with low air loss feature, then daily wound care using pure hypochlorous acid (Vashe) to cleanse and apply to the wound. Time in the supine position is to be limited.  If further evaluation is desired, recommend consultation with General surgery.   WOC Nurse ostomy consult note Stoma type/location: LLQ Colostomy Stomal assessment/size: Not measured today. Current pouch is intact Peristomal assessment: Not seen today Treatment options for stomal/peristomal skin: N/A Output Brown stool Ostomy pouching: 1pc.soft convex pouch. Patient uses the Sensura Mio pouch from [facility], which is not provided in house. He is informed that we use [employer] in house and that when the pouch he is wearing requires changing, he is to use either the "spare" pouch he has brought with him from home or the in house supplies that I have given Nursing guidance to provide Catha Gosselin, [facility] [place] # [phone]). Education provided: None today. Enrolled patient in [employer] DC program: No. He is established with a provider for supplies.   WOC nursing team will not follow, but will remain available to this patient, the nursing and medical teams.  Please re-consult if needed.  Thank you for inviting [place] to participate in this patient's Plan of Care.  Ladona Mow, MSN, RN, CNS, GNP, Leda Min, [employer], [employer] phone:  [phone]

## 2023-02-13 NOTE — Plan of Care (Signed)
  Problem: Health Behavior/Discharge Planning: Goal: Ability to manage health-related needs will improve Outcome: Progressing   Problem: Clinical Measurements: Goal: Ability to maintain clinical measurements within normal limits will improve Outcome: Progressing Goal: Will remain free from infection Outcome: Progressing Goal: Diagnostic test results will improve Outcome: Progressing Goal: Respiratory complications will improve Outcome: Progressing Goal: Cardiovascular complication will be avoided Outcome: Progressing   Problem: Coping: Goal: Level of anxiety will decrease Outcome: Adequate for Discharge   Problem: Elimination: Goal: Will not experience complications related to bowel motility Outcome: Adequate for Discharge Goal: Will not experience complications related to urinary retention Outcome: Progressing   Problem: Pain Managment: Goal: General experience of comfort will improve Outcome: Progressing   Problem: Safety: Goal: Ability to remain free from injury will improve Outcome: Progressing   Problem: Skin Integrity: Goal: Risk for impaired skin integrity will decrease Outcome: Progressing

## 2023-02-14 DIAGNOSIS — T148XXA Other injury of unspecified body region, initial encounter: Secondary | ICD-10-CM

## 2023-02-14 DIAGNOSIS — L8990 Pressure ulcer of unspecified site, unspecified stage: Secondary | ICD-10-CM | POA: Insufficient documentation

## 2023-02-14 DIAGNOSIS — E43 Unspecified severe protein-calorie malnutrition: Secondary | ICD-10-CM | POA: Insufficient documentation

## 2023-02-14 DIAGNOSIS — N3000 Acute cystitis without hematuria: Principal | ICD-10-CM

## 2023-02-14 DIAGNOSIS — E46 Unspecified protein-calorie malnutrition: Secondary | ICD-10-CM | POA: Insufficient documentation

## 2023-02-14 DIAGNOSIS — N390 Urinary tract infection, site not specified: Secondary | ICD-10-CM

## 2023-02-14 MED ORDER — SODIUM CHLORIDE 0.9 % IV SOLN
2.0000 g | INTRAVENOUS | Status: DC
  Filled 2023-02-14: qty 20

## 2023-02-14 NOTE — Discharge Instructions (Addendum)
Clease wound and surrounding tissue with Vashe Solution. Use Vashe dampened gauze to fill in wound defects. Top with dry gauze and cover with foam dressing. Change daily and as needed for dislodgement.

## 2023-02-14 NOTE — Progress Notes (Signed)
   02/14/23 1004  TOC Brief Assessment  Insurance and Status Reviewed  Patient has primary care physician No  Home environment has been reviewed Resides with mother  Prior level of function: Indpendently performs ADLs at home  Prior/Current Home Services No current home services  Social Determinants of Health Reivew SDOH reviewed no interventions necessary  Readmission risk has been reviewed Yes  Transition of care needs no transition of care needs at this time

## 2023-02-14 NOTE — Progress Notes (Signed)
Long discussion with patient and patient's mother concerning cancel of discharge and need to restart IV to administer antibiotic. Patient expressed frustration about not going home today and concern about taking several different antibiotics. Educated purpose of antibiotics chosen and administered upon admission and why new antibiotic is different. Patient refused IV insertion and antibiotic administration stating he wanted to wait until sensitivities returned so he would not have to take too many different antibiotics, says he did not understand why he should take ordered rocephin when he's not going to be taking it at home. Repeatedly attempted to educate and to answer all questions. Messaged MD to discuss w/ patient why antibiotic was chosen and possible timeline of treatment. Dr David Stall arrived quickly at bedside and answered all patient's questions but patient still stated he was refusing the Rocephin and would wait until sensitivities were in before agreeing to take another antibiotic. Patient educated possible risks of delay of treatment. Patient verbalized understanding. [name] present for entirety of conversation.
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
## 2023-02-15 DIAGNOSIS — N3000 Acute cystitis without hematuria: Secondary | ICD-10-CM | POA: Diagnosis not present

## 2023-02-15 LAB — URINE CULTURE: Culture: >=100000 — AB

## 2023-02-15 MED ORDER — CEPHALEXIN 500 MG PO CAPS: 500.0000 mg | ORAL_CAPSULE | Freq: Three times a day (TID) | ORAL | 0 refills | Status: AC

## 2023-02-15 MED ORDER — CEPHALEXIN 500 MG PO CAPS
500.0000 mg | ORAL_CAPSULE | Freq: Three times a day (TID) | ORAL | Status: DC
  Administered 2023-02-15: 500 mg via ORAL
  Filled 2023-02-15: qty 1

## 2023-02-15 NOTE — Plan of Care (Signed)
  Problem: Clinical Measurements: Goal: Ability to maintain clinical measurements within normal limits will improve Outcome: Adequate for Discharge Goal: Will remain free from infection Outcome: Progressing Goal: Diagnostic test results will improve Outcome: Progressing Goal: Cardiovascular complication will be avoided Outcome: Completed/Met   Problem: Skin Integrity: Goal: Risk for impaired skin integrity will decrease Outcome: Progressing

## 2023-02-15 NOTE — Plan of Care (Signed)
Pt ready to DC home with mother.
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
## 2023-06-23 ENCOUNTER — Inpatient Hospital Stay (HOSPITAL_COMMUNITY)
Admission: EM | Admit: 2023-06-23 | Discharge: 2023-07-01 | DRG: 871 | Disposition: A | Discharge status: Home / Self Care | Payer: 59 | Attending: Internal Medicine | Admitting: Internal Medicine

## 2023-06-23 DIAGNOSIS — Z5329 Procedure and treatment not carried out because of patient's decision for other reasons: Secondary | ICD-10-CM | POA: Diagnosis present

## 2023-06-23 DIAGNOSIS — D649 Anemia, unspecified: Secondary | ICD-10-CM

## 2023-06-23 DIAGNOSIS — E440 Moderate protein-calorie malnutrition: Secondary | ICD-10-CM | POA: Diagnosis present

## 2023-06-23 DIAGNOSIS — Z905 Acquired absence of kidney: Secondary | ICD-10-CM

## 2023-06-23 DIAGNOSIS — Z046 Encounter for general psychiatric examination, requested by authority: Secondary | ICD-10-CM

## 2023-06-23 DIAGNOSIS — M62561 Muscle wasting and atrophy, not elsewhere classified, right lower leg: Secondary | ICD-10-CM | POA: Diagnosis present

## 2023-06-23 DIAGNOSIS — L89323 Pressure ulcer of left buttock, stage 3: Secondary | ICD-10-CM | POA: Diagnosis present

## 2023-06-23 DIAGNOSIS — D75838 Other thrombocytosis: Secondary | ICD-10-CM | POA: Diagnosis present

## 2023-06-23 DIAGNOSIS — Z933 Colostomy status: Secondary | ICD-10-CM

## 2023-06-23 DIAGNOSIS — A419 Sepsis, unspecified organism: Secondary | ICD-10-CM | POA: Diagnosis not present

## 2023-06-23 DIAGNOSIS — I959 Hypotension, unspecified: Secondary | ICD-10-CM | POA: Diagnosis present

## 2023-06-23 DIAGNOSIS — L89224 Pressure ulcer of left hip, stage 4: Secondary | ICD-10-CM | POA: Diagnosis present

## 2023-06-23 DIAGNOSIS — M8668 Other chronic osteomyelitis, other site: Secondary | ICD-10-CM | POA: Diagnosis present

## 2023-06-23 DIAGNOSIS — G8220 Paraplegia, unspecified: Secondary | ICD-10-CM | POA: Diagnosis present

## 2023-06-23 DIAGNOSIS — Z5971 Insufficient health insurance coverage: Secondary | ICD-10-CM

## 2023-06-23 DIAGNOSIS — Z681 Body mass index (BMI) 19 or less, adult: Secondary | ICD-10-CM

## 2023-06-23 DIAGNOSIS — L89154 Pressure ulcer of sacral region, stage 4: Secondary | ICD-10-CM | POA: Diagnosis present

## 2023-06-23 DIAGNOSIS — M869 Osteomyelitis, unspecified: Secondary | ICD-10-CM | POA: Diagnosis not present

## 2023-06-23 DIAGNOSIS — D62 Acute posthemorrhagic anemia: Secondary | ICD-10-CM | POA: Diagnosis present

## 2023-06-23 DIAGNOSIS — F17290 Nicotine dependence, other tobacco product, uncomplicated: Secondary | ICD-10-CM | POA: Diagnosis present

## 2023-06-23 DIAGNOSIS — Z7401 Bed confinement status: Secondary | ICD-10-CM

## 2023-06-23 DIAGNOSIS — L89214 Pressure ulcer of right hip, stage 4: Secondary | ICD-10-CM | POA: Diagnosis present

## 2023-06-23 DIAGNOSIS — G4701 Insomnia due to medical condition: Secondary | ICD-10-CM | POA: Diagnosis present

## 2023-06-23 DIAGNOSIS — L89313 Pressure ulcer of right buttock, stage 3: Secondary | ICD-10-CM | POA: Diagnosis present

## 2023-06-23 DIAGNOSIS — L89626 Pressure-induced deep tissue damage of left heel: Secondary | ICD-10-CM | POA: Diagnosis present

## 2023-06-23 DIAGNOSIS — L8990 Pressure ulcer of unspecified site, unspecified stage: Secondary | ICD-10-CM | POA: Diagnosis present

## 2023-06-23 DIAGNOSIS — N319 Neuromuscular dysfunction of bladder, unspecified: Secondary | ICD-10-CM | POA: Diagnosis present

## 2023-06-23 DIAGNOSIS — F17210 Nicotine dependence, cigarettes, uncomplicated: Secondary | ICD-10-CM | POA: Diagnosis present

## 2023-06-23 DIAGNOSIS — S14108S Unspecified injury at C8 level of cervical spinal cord, sequela: Secondary | ICD-10-CM

## 2023-06-23 DIAGNOSIS — M86659 Other chronic osteomyelitis, unspecified thigh: Secondary | ICD-10-CM | POA: Diagnosis present

## 2023-06-23 DIAGNOSIS — T45516A Underdosing of anticoagulants, initial encounter: Secondary | ICD-10-CM | POA: Diagnosis present

## 2023-06-23 DIAGNOSIS — Z86718 Personal history of other venous thrombosis and embolism: Secondary | ICD-10-CM

## 2023-06-23 DIAGNOSIS — Z87442 Personal history of urinary calculi: Secondary | ICD-10-CM

## 2023-06-23 DIAGNOSIS — N390 Urinary tract infection, site not specified: Secondary | ICD-10-CM | POA: Diagnosis present

## 2023-06-23 DIAGNOSIS — Z91199 Patient's noncompliance with other medical treatment and regimen due to unspecified reason: Secondary | ICD-10-CM

## 2023-06-23 DIAGNOSIS — Z993 Dependence on wheelchair: Secondary | ICD-10-CM

## 2023-06-23 DIAGNOSIS — F32A Depression, unspecified: Secondary | ICD-10-CM | POA: Diagnosis present

## 2023-06-23 DIAGNOSIS — W3400XS Accidental discharge from unspecified firearms or gun, sequela: Secondary | ICD-10-CM

## 2023-06-23 DIAGNOSIS — L8994 Pressure ulcer of unspecified site, stage 4: Secondary | ICD-10-CM

## 2023-06-23 DIAGNOSIS — L89159 Pressure ulcer of sacral region, unspecified stage: Secondary | ICD-10-CM | POA: Diagnosis present

## 2023-06-23 DIAGNOSIS — B9620 Unspecified Escherichia coli [E. coli] as the cause of diseases classified elsewhere: Secondary | ICD-10-CM | POA: Diagnosis present

## 2023-06-23 DIAGNOSIS — M62562 Muscle wasting and atrophy, not elsewhere classified, left lower leg: Secondary | ICD-10-CM | POA: Diagnosis present

## 2023-06-23 DIAGNOSIS — L89623 Pressure ulcer of left heel, stage 3: Secondary | ICD-10-CM | POA: Diagnosis present

## 2023-06-23 DIAGNOSIS — Z91041 Radiographic dye allergy status: Secondary | ICD-10-CM

## 2023-06-23 DIAGNOSIS — D75839 Thrombocytosis, unspecified: Secondary | ICD-10-CM | POA: Diagnosis present

## 2023-06-23 MED ORDER — SODIUM CHLORIDE 0.9 % IV BOLUS
1000.0000 mL | Freq: Once | INTRAVENOUS | Status: AC
  Administered 2023-06-24: 1000 mL via INTRAVENOUS

## 2023-06-23 MED ORDER — ACETAMINOPHEN 500 MG PO TABS
1000.0000 mg | ORAL_TABLET | Freq: Once | ORAL | Status: AC
  Administered 2023-06-24: 1000 mg via ORAL
  Filled 2023-06-23: qty 2
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
## 2023-06-23 NOTE — ED Notes (Addendum)
Pt refused vitals. Pt said he would like something to drink, I ask pt can I get vitals, then I will ask the Nurse to see if he can have something to drink. Pt said "NO"
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
## 2023-06-24 ENCOUNTER — Other Ambulatory Visit: Payer: Self-pay

## 2023-06-24 ENCOUNTER — Encounter (HOSPITAL_COMMUNITY): Payer: Self-pay | Admitting: Internal Medicine

## 2023-06-24 ENCOUNTER — Emergency Department (HOSPITAL_COMMUNITY): Payer: Medicaid Other

## 2023-06-24 DIAGNOSIS — Z7401 Bed confinement status: Secondary | ICD-10-CM | POA: Diagnosis not present

## 2023-06-24 DIAGNOSIS — M4628 Osteomyelitis of vertebra, sacral and sacrococcygeal region: Secondary | ICD-10-CM | POA: Diagnosis not present

## 2023-06-24 DIAGNOSIS — N319 Neuromuscular dysfunction of bladder, unspecified: Secondary | ICD-10-CM | POA: Diagnosis present

## 2023-06-24 DIAGNOSIS — L89224 Pressure ulcer of left hip, stage 4: Secondary | ICD-10-CM | POA: Diagnosis present

## 2023-06-24 DIAGNOSIS — D62 Acute posthemorrhagic anemia: Secondary | ICD-10-CM | POA: Diagnosis present

## 2023-06-24 DIAGNOSIS — F17210 Nicotine dependence, cigarettes, uncomplicated: Secondary | ICD-10-CM | POA: Diagnosis present

## 2023-06-24 DIAGNOSIS — S14108S Unspecified injury at C8 level of cervical spinal cord, sequela: Secondary | ICD-10-CM | POA: Diagnosis not present

## 2023-06-24 DIAGNOSIS — Z993 Dependence on wheelchair: Secondary | ICD-10-CM | POA: Diagnosis not present

## 2023-06-24 DIAGNOSIS — M8668 Other chronic osteomyelitis, other site: Secondary | ICD-10-CM | POA: Diagnosis present

## 2023-06-24 DIAGNOSIS — N390 Urinary tract infection, site not specified: Secondary | ICD-10-CM

## 2023-06-24 DIAGNOSIS — L89214 Pressure ulcer of right hip, stage 4: Secondary | ICD-10-CM | POA: Diagnosis present

## 2023-06-24 DIAGNOSIS — L8994 Pressure ulcer of unspecified site, stage 4: Secondary | ICD-10-CM | POA: Diagnosis not present

## 2023-06-24 DIAGNOSIS — F17290 Nicotine dependence, other tobacco product, uncomplicated: Secondary | ICD-10-CM | POA: Diagnosis present

## 2023-06-24 DIAGNOSIS — T45516A Underdosing of anticoagulants, initial encounter: Secondary | ICD-10-CM | POA: Diagnosis present

## 2023-06-24 DIAGNOSIS — Z046 Encounter for general psychiatric examination, requested by authority: Secondary | ICD-10-CM

## 2023-06-24 DIAGNOSIS — G8220 Paraplegia, unspecified: Secondary | ICD-10-CM

## 2023-06-24 DIAGNOSIS — L89159 Pressure ulcer of sacral region, unspecified stage: Secondary | ICD-10-CM | POA: Diagnosis present

## 2023-06-24 DIAGNOSIS — Z933 Colostomy status: Secondary | ICD-10-CM | POA: Diagnosis not present

## 2023-06-24 DIAGNOSIS — E440 Moderate protein-calorie malnutrition: Secondary | ICD-10-CM | POA: Diagnosis present

## 2023-06-24 DIAGNOSIS — D75839 Thrombocytosis, unspecified: Secondary | ICD-10-CM

## 2023-06-24 DIAGNOSIS — F32A Depression, unspecified: Secondary | ICD-10-CM | POA: Diagnosis present

## 2023-06-24 DIAGNOSIS — Z905 Acquired absence of kidney: Secondary | ICD-10-CM | POA: Diagnosis not present

## 2023-06-24 DIAGNOSIS — M86659 Other chronic osteomyelitis, unspecified thigh: Secondary | ICD-10-CM | POA: Diagnosis not present

## 2023-06-24 DIAGNOSIS — M869 Osteomyelitis, unspecified: Secondary | ICD-10-CM | POA: Diagnosis present

## 2023-06-24 DIAGNOSIS — W3400XS Accidental discharge from unspecified firearms or gun, sequela: Secondary | ICD-10-CM | POA: Diagnosis not present

## 2023-06-24 DIAGNOSIS — A419 Sepsis, unspecified organism: Principal | ICD-10-CM | POA: Diagnosis present

## 2023-06-24 DIAGNOSIS — D75838 Other thrombocytosis: Secondary | ICD-10-CM | POA: Diagnosis present

## 2023-06-24 DIAGNOSIS — L89154 Pressure ulcer of sacral region, stage 4: Secondary | ICD-10-CM | POA: Diagnosis present

## 2023-06-24 DIAGNOSIS — L89623 Pressure ulcer of left heel, stage 3: Secondary | ICD-10-CM | POA: Diagnosis present

## 2023-06-24 DIAGNOSIS — Z86718 Personal history of other venous thrombosis and embolism: Secondary | ICD-10-CM

## 2023-06-24 DIAGNOSIS — L89323 Pressure ulcer of left buttock, stage 3: Secondary | ICD-10-CM | POA: Diagnosis present

## 2023-06-24 DIAGNOSIS — L89313 Pressure ulcer of right buttock, stage 3: Secondary | ICD-10-CM | POA: Diagnosis present

## 2023-06-24 DIAGNOSIS — Z681 Body mass index (BMI) 19 or less, adult: Secondary | ICD-10-CM | POA: Diagnosis not present

## 2023-06-24 LAB — CBC WITH DIFFERENTIAL/PLATELET
Abs Immature Granulocytes: 0.14 10*3/uL — ABNORMAL HIGH (ref 0.00–0.07)
Basophils Absolute: 0 10*3/uL (ref 0.0–0.1)
Basophils Relative: 0 %
Eosinophils Absolute: 0.1 10*3/uL (ref 0.0–0.5)
Eosinophils Relative: 1 %
HCT: 19.4 % — ABNORMAL LOW (ref 39.0–52.0)
Hemoglobin: 5.6 g/dL — CL (ref 13.0–17.0)
Immature Granulocytes: 1 %
Lymphocytes Relative: 14 %
Lymphs Abs: 2.8 10*3/uL (ref 0.7–4.0)
MCH: 21.4 pg — ABNORMAL LOW (ref 26.0–34.0)
MCHC: 28.9 g/dL — ABNORMAL LOW (ref 30.0–36.0)
MCV: 74 fL — ABNORMAL LOW (ref 80.0–100.0)
Monocytes Absolute: 1 10*3/uL (ref 0.1–1.0)
Monocytes Relative: 5 %
Neutro Abs: 15.6 10*3/uL — ABNORMAL HIGH (ref 1.7–7.7)
Neutrophils Relative %: 79 %
Platelets: 673 10*3/uL — ABNORMAL HIGH (ref 150–400)
RBC: 2.62 MIL/uL — ABNORMAL LOW (ref 4.22–5.81)
RDW: 18.8 % — ABNORMAL HIGH (ref 11.5–15.5)
WBC: 19.7 10*3/uL — ABNORMAL HIGH (ref 4.0–10.5)
nRBC: 0 % (ref 0.0–0.2)

## 2023-06-24 LAB — I-STAT CG4 LACTIC ACID, ED
Lactic Acid, Venous: 2.2 mmol/L (ref 0.5–1.9)
Lactic Acid, Venous: 2.3 mmol/L (ref 0.5–1.9)

## 2023-06-24 LAB — COMPREHENSIVE METABOLIC PANEL
ALT: 36 U/L (ref 0–44)
AST: 49 U/L — ABNORMAL HIGH (ref 15–41)
Albumin: <1.5 g/dL — ABNORMAL LOW (ref 3.5–5.0)
Alkaline Phosphatase: 194 U/L — ABNORMAL HIGH (ref 38–126)
Anion gap: 9 (ref 5–15)
BUN: 13 mg/dL (ref 6–20)
CO2: 20 mmol/L — ABNORMAL LOW (ref 22–32)
Calcium: 7.7 mg/dL — ABNORMAL LOW (ref 8.9–10.3)
Chloride: 102 mmol/L (ref 98–111)
Creatinine, Ser: 0.99 mg/dL (ref 0.61–1.24)
GFR, Estimated: >60 mL/min (ref >60)
Glucose, Bld: 115 mg/dL — ABNORMAL HIGH (ref 70–99)
Potassium: 4.5 mmol/L (ref 3.5–5.1)
Sodium: 131 mmol/L — ABNORMAL LOW (ref 135–145)
Total Bilirubin: 0.2 mg/dL (ref <1.2)
Total Protein: 6.4 g/dL — ABNORMAL LOW (ref 6.5–8.1)

## 2023-06-24 LAB — ETHANOL: Alcohol, Ethyl (B): <10 mg/dL (ref <10)

## 2023-06-24 LAB — PROTIME-INR
INR: 1.4 — ABNORMAL HIGH (ref 0.8–1.2)
Prothrombin Time: 17.4 s — ABNORMAL HIGH (ref 11.4–15.2)

## 2023-06-24 LAB — URINALYSIS, ROUTINE W REFLEX MICROSCOPIC
RBC / HPF: >50 RBC/hpf (ref 0–5)
WBC, UA: >50 WBC/hpf (ref 0–5)

## 2023-06-24 LAB — IRON AND TIBC
Iron: 18 ug/dL — ABNORMAL LOW (ref 45–182)
Saturation Ratios: 15 % — ABNORMAL LOW (ref 17.9–39.5)
TIBC: 119 ug/dL — ABNORMAL LOW (ref 250–450)
UIBC: 101 ug/dL

## 2023-06-24 LAB — RAPID URINE DRUG SCREEN, HOSP PERFORMED
Amphetamines: NOT DETECTED
Barbiturates: NOT DETECTED
Benzodiazepines: NOT DETECTED
Cocaine: NOT DETECTED
Opiates: NOT DETECTED
Tetrahydrocannabinol: NOT DETECTED

## 2023-06-24 LAB — PREALBUMIN: Prealbumin: <5 mg/dL — ABNORMAL LOW (ref 18–38)

## 2023-06-24 LAB — ABO/RH: ABO/RH(D): O POS

## 2023-06-24 LAB — SEDIMENTATION RATE: Sed Rate: 110 mm/h — ABNORMAL HIGH (ref 0–16)

## 2023-06-24 LAB — PREPARE RBC (CROSSMATCH)

## 2023-06-24 LAB — HEMOGLOBIN AND HEMATOCRIT, BLOOD
HCT: 25.1 % — ABNORMAL LOW (ref 39.0–52.0)
Hemoglobin: 7.7 g/dL — ABNORMAL LOW (ref 13.0–17.0)

## 2023-06-24 LAB — C-REACTIVE PROTEIN: CRP: 18 mg/dL — ABNORMAL HIGH (ref <1.0)

## 2023-06-24 LAB — FERRITIN: Ferritin: 1014 ng/mL — ABNORMAL HIGH (ref 24–336)

## 2023-06-24 MED ORDER — LORAZEPAM 2 MG/ML IJ SOLN
INTRAMUSCULAR | Status: AC
  Filled 2023-06-24: qty 1

## 2023-06-24 MED ORDER — HYDROCODONE-ACETAMINOPHEN 5-325 MG PO TABS
1.0000 | ORAL_TABLET | ORAL | Status: DC | PRN
  Administered 2023-06-25 – 2023-06-30 (×5): 1 via ORAL
  Filled 2023-06-24 (×5): qty 1

## 2023-06-24 MED ORDER — ALBUTEROL SULFATE (2.5 MG/3ML) 0.083% IN NEBU: 2.5000 mg | INHALATION_SOLUTION | Freq: Four times a day (QID) | RESPIRATORY_TRACT | Status: DC | PRN

## 2023-06-24 MED ORDER — SODIUM CHLORIDE 0.9 % IV SOLN
2.0000 g | Freq: Once | INTRAVENOUS | Status: AC
  Administered 2023-06-24: 2 g via INTRAVENOUS
  Filled 2023-06-24: qty 12.5

## 2023-06-24 MED ORDER — VANCOMYCIN HCL IN DEXTROSE 1-5 GM/200ML-% IV SOLN: 1000.0000 mg | Freq: Once | INTRAVENOUS | Status: DC

## 2023-06-24 MED ORDER — VANCOMYCIN HCL 750 MG/150ML IV SOLN
750.0000 mg | Freq: Three times a day (TID) | INTRAVENOUS | Status: DC
  Administered 2023-06-24 – 2023-06-25 (×4): 750 mg via INTRAVENOUS
  Filled 2023-06-24 (×5): qty 150

## 2023-06-24 MED ORDER — SODIUM CHLORIDE 0.9 % IV BOLUS
1000.0000 mL | Freq: Once | INTRAVENOUS | Status: AC
  Administered 2023-06-24: 1000 mL via INTRAVENOUS

## 2023-06-24 MED ORDER — SODIUM CHLORIDE 0.9% IV SOLUTION: Freq: Once | INTRAVENOUS | Status: AC

## 2023-06-24 MED ORDER — ENSURE ENLIVE PO LIQD
237.0000 mL | Freq: Two times a day (BID) | ORAL | Status: DC
  Administered 2023-06-24 – 2023-06-25 (×2): 237 mL via ORAL
  Filled 2023-06-24 (×2): qty 237

## 2023-06-24 MED ORDER — DIPHENHYDRAMINE HCL 50 MG/ML IJ SOLN: 50.0000 mg | Freq: Once | INTRAMUSCULAR | Status: DC

## 2023-06-24 MED ORDER — SODIUM CHLORIDE 0.9 % IV SOLN
2.0000 g | Freq: Three times a day (TID) | INTRAVENOUS | Status: DC
  Administered 2023-06-24 – 2023-06-29 (×16): 2 g via INTRAVENOUS
  Filled 2023-06-24 (×16): qty 12.5

## 2023-06-24 MED ORDER — METRONIDAZOLE 500 MG/100ML IV SOLN
500.0000 mg | Freq: Once | INTRAVENOUS | Status: AC
  Administered 2023-06-24: 500 mg via INTRAVENOUS
  Filled 2023-06-24: qty 100

## 2023-06-24 MED ORDER — SODIUM CHLORIDE 0.9 % IV SOLN: INTRAVENOUS | Status: DC

## 2023-06-24 MED ORDER — VANCOMYCIN HCL 1.5 G IV SOLR
1500.0000 mg | Freq: Once | INTRAVENOUS | Status: AC
  Administered 2023-06-24: 1500 mg via INTRAVENOUS
  Filled 2023-06-24: qty 30

## 2023-06-24 MED ORDER — ALBUMIN HUMAN 25 % IV SOLN
25.0000 g | Freq: Once | INTRAVENOUS | Status: AC
  Administered 2023-06-24: 25 g via INTRAVENOUS
  Filled 2023-06-24: qty 100

## 2023-06-24 MED ORDER — DIPHENHYDRAMINE HCL 25 MG PO CAPS: 50.0000 mg | ORAL_CAPSULE | Freq: Once | ORAL | Status: DC

## 2023-06-24 MED ORDER — ACETAMINOPHEN 325 MG PO TABS
650.0000 mg | ORAL_TABLET | Freq: Once | ORAL | Status: AC
  Administered 2023-06-24: 650 mg via ORAL
  Filled 2023-06-24: qty 2

## 2023-06-24 MED ORDER — METHYLPREDNISOLONE SODIUM SUCC 40 MG IJ SOLR
40.0000 mg | Freq: Once | INTRAMUSCULAR | Status: AC
  Administered 2023-06-24: 40 mg via INTRAVENOUS
  Filled 2023-06-24: qty 1

## 2023-06-24 MED ORDER — METRONIDAZOLE 500 MG/100ML IV SOLN
500.0000 mg | Freq: Two times a day (BID) | INTRAVENOUS | Status: DC
  Administered 2023-06-24 – 2023-06-25 (×3): 500 mg via INTRAVENOUS
  Filled 2023-06-24 (×3): qty 100

## 2023-06-24 NOTE — ED Notes (Signed)
Patient moved to yellow zone IVC IS CURRENTLY ATTACHED TO CLIPBOARD IN BLUE ZONE
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
## 2023-06-24 NOTE — ED Notes (Signed)
Bladder Scan 207 ml

## 2023-06-24 NOTE — ED Notes (Signed)
Wound care at bedside.

## 2023-06-24 NOTE — ED Notes (Addendum)
Pt presented IVC'd, waiting on the first exam.
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
## 2023-06-24 NOTE — Consult Note (Addendum)
WOC Nurse Consult Note: Reason for Consult: Requested to assess chronic pressure ulcers. The patient has osteomyelitis. This is beyond our scope, we recommend a orthopedic surgeon assessment.   Wound type: Pressure ulcers stage 4 and 3 Pressure Injury POA: Yes Measurement: 1 - Right buttlock 4x2.5x2cm. Wound bed: 100% red. Stage 3  2 - Right hip 8x7x3cm. Wound bed: 50% black scar, 50% red. Stage 4  3 - Left hip 7x9x.3cm. Wound bed: 80% black/brown 20% red. Stage 4  4 - Sacrum 7x11x1.5cm, Wound bed: 100% red. Stage 4  5 - Left buttlock 6x7x.2 . Wound bed: 100% red. Stage 3  6 - Rectum/ gluteo bottom 6x6x2 cm, tunneling connecting 2 wounds. 100% red Informed by the mom that the wound by the rectum/bottom gluteo is a recurrence. Stage 4  No odor, medium amount.   Dressing procedure/placement/frequency: Wound 1 - apply Aquacel by wound bed, cover with foam dressing. Change every 3 days or if is saturated.  Right hip (wound 2) and left hip (wound 3), Apply intrasite #[id number] to moisture dry tissue before the foam dressing. Cover with foam dressing. Change every 3 days or if is saturated.  Wound 4 and 5, cover with foam dressing, change every 3 days or if is saturated.  Wound number 6 (Rectum/ gluteo bottom) - apply xeroform into the tunneling and wound bed before the foam dressing. Change daily. Cover with foam dressing, change every 3 days or if is saturated  WOC Nurse ostomy consult note Stoma type/location: colostomy Stomal assessment/size: 1 1/2 in. The pt has this ostomy for several years, he can change the ostomy pouch by himself. Last change yesterday (12/5) Output 50 ml Ostomy pouching: 1pc.  Ostomy pouch [id number]. He uses Coloplast, but he is not refusing to change the brand.  WOC team will not plan to follow further.  Please reconsult if further assistance is needed. Thank-you,  Denyse Amass BSN, RN, [employer], WOC  (Pager: [phone])

## 2023-06-24 NOTE — Progress Notes (Signed)
Pharmacy Antibiotic Note  Luke Velasquez is a 22 y.o. male admitted on 06/23/2023 with concern for sepsis.  Pharmacy has been consulted for vancomycin dosing.  Plan: Vancomycin 750 mg IV Q 8 hrs. Goal AUC 400-550.  Expected AUC 470. Also started on cefepime and metronidazole at appropriate doses by MD.  Height: 6\' 4"  (193 cm) Weight: 68 kg (150 lb) (from July 2024 records) IBW/kg (Calculated) : 86.8  Temp (24hrs), Avg:100.7 F (38.2 C), Min:99.2 F (37.3 C), Max:103 F (39.4 C)  Recent Labs  Lab 06/24/23 0051 06/24/23 0054 06/24/23 0238  WBC  --  19.7*  --   CREATININE  --  0.99  --   LATICACIDVEN 2.3*  --  2.2*    Estimated Creatinine Clearance: 113.5 mL/min (by C-G formula based on SCr of 0.99 mg/dL).    Allergies  Allergen Reactions   Ivp Dye [Iodinated Contrast Media] Swelling    Eye itching and swelling    Thank you for allowing pharmacy to be a part of this patient's care.  Vernard Gambles, PharmD, BCPS  06/24/2023 5:46 AM
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
## 2023-06-24 NOTE — ED Notes (Signed)
Pt refuses to be changed, Pt needs to cleaned and linen needs to be changed.

## 2023-06-24 NOTE — ED Notes (Signed)
IVC PAPERWORK GOING UP TO 5W RM 28 WITH PATIENT

## 2023-06-24 NOTE — Sepsis Progress Note (Signed)
Elink monitoring for the code sepsis protocol.  

## 2023-06-25 DIAGNOSIS — N390 Urinary tract infection, site not specified: Secondary | ICD-10-CM | POA: Diagnosis not present

## 2023-06-25 DIAGNOSIS — G4701 Insomnia due to medical condition: Secondary | ICD-10-CM | POA: Diagnosis present

## 2023-06-25 DIAGNOSIS — A419 Sepsis, unspecified organism: Secondary | ICD-10-CM | POA: Diagnosis not present

## 2023-06-25 LAB — BASIC METABOLIC PANEL
Anion gap: 6 (ref 5–15)
BUN: 15 mg/dL (ref 6–20)
CO2: 21 mmol/L — ABNORMAL LOW (ref 22–32)
Calcium: 7.8 mg/dL — ABNORMAL LOW (ref 8.9–10.3)
Chloride: 110 mmol/L (ref 98–111)
Creatinine, Ser: 0.7 mg/dL (ref 0.61–1.24)
GFR, Estimated: >60 mL/min (ref >60)
Glucose, Bld: 108 mg/dL — ABNORMAL HIGH (ref 70–99)
Potassium: 4.7 mmol/L (ref 3.5–5.1)
Sodium: 137 mmol/L (ref 135–145)

## 2023-06-25 LAB — CBC
HCT: 29.5 % — ABNORMAL LOW (ref 39.0–52.0)
Hemoglobin: 8.9 g/dL — ABNORMAL LOW (ref 13.0–17.0)
MCH: 23.6 pg — ABNORMAL LOW (ref 26.0–34.0)
MCHC: 30.2 g/dL (ref 30.0–36.0)
MCV: 78.2 fL — ABNORMAL LOW (ref 80.0–100.0)
Platelets: 556 10*3/uL — ABNORMAL HIGH (ref 150–400)
RBC: 3.77 MIL/uL — ABNORMAL LOW (ref 4.22–5.81)
RDW: 18.6 % — ABNORMAL HIGH (ref 11.5–15.5)
WBC: 26.2 10*3/uL — ABNORMAL HIGH (ref 4.0–10.5)
nRBC: 0 % (ref 0.0–0.2)

## 2023-06-25 LAB — BPAM RBC
Blood Product Expiration Date: 202412172359
Blood Product Expiration Date: 202412272359
ISSUE DATE / TIME: 202412060458
ISSUE DATE / TIME: 202412060751
Unit Type and Rh: 5100
Unit Type and Rh: 5100

## 2023-06-25 LAB — TYPE AND SCREEN
ABO/RH(D): O POS
Antibody Screen: NEGATIVE
Unit division: 0
Unit division: 0

## 2023-06-25 LAB — MAGNESIUM: Magnesium: 2 mg/dL (ref 1.7–2.4)

## 2023-06-25 LAB — PROCALCITONIN: Procalcitonin: 0.96 ng/mL

## 2023-06-25 MED ORDER — METRONIDAZOLE 500 MG PO TABS
500.0000 mg | ORAL_TABLET | Freq: Two times a day (BID) | ORAL | Status: DC
  Administered 2023-06-25 – 2023-07-01 (×12): 500 mg via ORAL
  Filled 2023-06-25 (×12): qty 1

## 2023-06-25 MED ORDER — TRAZODONE HCL 50 MG PO TABS
50.0000 mg | ORAL_TABLET | Freq: Every day | ORAL | Status: DC
  Administered 2023-06-25 – 2023-06-30 (×6): 50 mg via ORAL
  Filled 2023-06-25 (×6): qty 1

## 2023-06-25 MED ORDER — DOXYCYCLINE HYCLATE 100 MG IV SOLR
100.0000 mg | Freq: Two times a day (BID) | INTRAVENOUS | Status: DC
  Administered 2023-06-25 – 2023-06-28 (×6): 100 mg via INTRAVENOUS
  Filled 2023-06-25 (×8): qty 100

## 2023-06-25 MED ORDER — HEPARIN SODIUM (PORCINE) 5000 UNIT/ML IJ SOLN
5000.0000 [IU] | Freq: Three times a day (TID) | INTRAMUSCULAR | Status: DC
  Administered 2023-06-25 – 2023-07-01 (×17): 5000 [IU] via SUBCUTANEOUS
  Filled 2023-06-25 (×18): qty 1

## 2023-06-25 MED ORDER — HYDROXYZINE HCL 25 MG PO TABS: 25.0000 mg | ORAL_TABLET | Freq: Three times a day (TID) | ORAL | Status: DC | PRN
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
## 2023-06-25 NOTE — Progress Notes (Signed)
  Bedside nurse reported that patient removed telemetry monitor battery as the telemetry continues to beeping which was annoying the patient. -Requested bedside nurse if we can place back the telemetry again.
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
## 2023-06-25 NOTE — Progress Notes (Signed)
Physical Therapy Wound Evaluation & Treatment Patient Details  Name: Luke Velasquez MRN: [id number] Date of Birth: [birth year]  Today's Date: 06/25/2023 Time: 4259-5638 Time Calculation (min): 36 min  Subjective  Subjective Assessment Subjective: Pt reported he liked his legs to be directly stacked on top of each other while sidelying for session Patient and Family Stated Goals: to heal wound Date of Onset:  (unknown) Prior Treatments: dressing changes, OP wound care per chart  Pain Score:  Pt denied pain, pt paraplegic  Wound Assessment  Pressure Injury 06/24/23 Hip Left Stage 4 - Full thickness tissue loss with exposed bone, tendon or muscle. (Active)  Wound Image   06/25/23 1300  Dressing Type Foam - Lift dressing to assess site every shift;Gauze (Comment);Honey;Barrier Film (skin prep) 06/25/23 1300  Dressing Clean, Dry, Intact 06/25/23 1300  Dressing Change Frequency Every 3 days 06/25/23 1300  State of Healing Eschar 06/25/23 1300  Site / Wound Assessment Black;Brown;Pink;Yellow 06/25/23 1300  % Wound base Red or Granulating 20% 06/25/23 1300  % Wound base Yellow/Fibrinous Exudate 10% 06/25/23 1300  % Wound base Black/Eschar 70% 06/25/23 1300  % Wound base Other/Granulation Tissue (Comment) 0% 06/25/23 1300  Peri-wound Assessment Intact 06/25/23 1300  Wound Length (cm) 9.2 cm 06/25/23 1300  Wound Width (cm) 9 cm 06/25/23 1300  Wound Depth (cm) 0.2 cm 06/25/23 1300  Wound Surface Area (cm^2) 82.8 cm^2 06/25/23 1300  Wound Volume (cm^3) 16.56 cm^3 06/25/23 1300  Tunneling (cm) 0 06/25/23 1300  Undermining (cm) 0 06/25/23 1300  Margins Unattached edges (unapproximated) 06/25/23 1300  Drainage Amount Moderate 06/25/23 1300  Drainage Description Serosanguineous 06/25/23 1300  Treatment Cleansed;Debridement (Selective);Irrigation;Off loading 06/25/23 1300   Selective Debridement (non-excisional) Selective Debridement (non-excisional) - Location: L hip Selective Debridement  (non-excisional) - Tools Used: Forceps, Scalpel Selective Debridement (non-excisional) - Tissue Removed: Black eschar and yellow slough    Wound Assessment and Plan  Wound Therapy - Assess/Plan/Recommendations Wound Therapy - Clinical Statement: Pt presents with multiple pressure ulcers at his buttocks and L hip. His L hip wound was covered primarily by black eschar and yellow slough. The majority of the first layer of the black eschar was removed with selective debridement this date. Areas of the wound appear to go deeper. Pt would benefit from further wound therapy to decrease L hip wound bioburden and promote wound healing. Wound Therapy - Functional Problem List: paraplegia affecting mobility Factors Delaying/Impairing Wound Healing: Altered sensation, Infection - systemic/local, Immobility Hydrotherapy Plan: Debridement, Dressing change, Patient/family education Wound Therapy - Frequency: 2X / week Wound Therapy - Follow Up Recommendations: dressing changes by family/patient, dressing changes by RN  Wound Therapy Goals- Improve the function of patient's integumentary system by progressing the wound(s) through the phases of wound healing (inflammation - proliferation - remodeling) by: Wound Therapy Goals - Improve the function of patient's integumentary system by progressing the wound(s) through the phases of wound healing by: Decrease Necrotic Tissue to: 10% Decrease Necrotic Tissue - Progress: Goal set today Increase Granulation Tissue to: 90% Increase Granulation Tissue - Progress: Goal set today Improve Drainage Characteristics: Min, Serous Improve Drainage Characteristics - Progress: Goal set today Patient/Family will be able to : perform dressing changes independently Patient/Family Instruction Goal - Progress: Goal set today Goals/treatment plan/discharge plan were made with and agreed upon by patient/family: Yes Time For Goal Achievement: 7 days Wound Therapy - Potential for  Goals: Good  Goals will be updated until maximal potential achieved or discharge criteria met.  Discharge criteria: when goals achieved,  discharge from hospital, MD decision/surgical intervention, no progress towards goals, refusal/missing three consecutive treatments without notification or medical reason.  GP     Charges PT Wound Care Charges $Wound Debridement up to 20 cm: < or equal to 20 cm $ Wound Debridement each add'l 20 sqcm: 3 $PT Hydrotherapy Visit: 1 Visit     Virgil Benedict, PT, DPT Acute Rehabilitation Services  Office: [phone]   [name] 06/25/2023, 1:48 PM

## 2023-06-25 NOTE — Plan of Care (Addendum)
Pt remains on RA. Refusing to wear pulse ox but allowed spot checks throughout the night. Constantly took off leads and battery to tele box and refused to wear those as well but allowed spot checks when asked. Refused turns when asked and refused pillows. Provider notified. Pt did not want bedside nurse to look at him unclothed or change his linen but pleasant otherwise. No contact from family during shift.    Problem: Safety: Goal: Non-violent Restraint(s) Outcome: Progressing   Problem: Education: Goal: Knowledge of General Education information will improve Description: Including pain rating scale, medication(s)/side effects and non-pharmacologic comfort measures Outcome: Progressing   Problem: Health Behavior/Discharge Planning: Goal: Ability to manage health-related needs will improve Outcome: Progressing   Problem: Clinical Measurements: Goal: Ability to maintain clinical measurements within normal limits will improve Outcome: Progressing Goal: Will remain free from infection Outcome: Progressing Goal: Diagnostic test results will improve Outcome: Progressing Goal: Respiratory complications will improve Outcome: Progressing Goal: Cardiovascular complication will be avoided Outcome: Progressing   Problem: Activity: Goal: Risk for activity intolerance will decrease Outcome: Progressing   Problem: Nutrition: Goal: Adequate nutrition will be maintained Outcome: Progressing   Problem: Coping: Goal: Level of anxiety will decrease Outcome: Progressing   Problem: Elimination: Goal: Will not experience complications related to bowel motility Outcome: Progressing Goal: Will not experience complications related to urinary retention Outcome: Progressing   Problem: Pain Management: Goal: General experience of comfort will improve Outcome: Progressing   Problem: Safety: Goal: Ability to remain free from injury will improve Outcome: Progressing   Problem: Skin  Integrity: Goal: Risk for impaired skin integrity will decrease Outcome: Progressing

## 2023-06-25 NOTE — Progress Notes (Signed)
Ok to change vanc to doxy and flagyl to PO per Dr Drue Second.   Ulyses Southward, PharmD, BCIDP, AAHIVP, CPP Infectious Disease Pharmacist 06/25/2023 3:01 PM

## 2023-06-26 DIAGNOSIS — N390 Urinary tract infection, site not specified: Secondary | ICD-10-CM | POA: Diagnosis not present

## 2023-06-26 DIAGNOSIS — A419 Sepsis, unspecified organism: Secondary | ICD-10-CM | POA: Diagnosis not present

## 2023-06-26 LAB — BRAIN NATRIURETIC PEPTIDE: B Natriuretic Peptide: 84.2 pg/mL (ref 0.0–100.0)

## 2023-06-26 LAB — BASIC METABOLIC PANEL
Anion gap: 8 (ref 5–15)
BUN: 17 mg/dL (ref 6–20)
CO2: 22 mmol/L (ref 22–32)
Calcium: 8.4 mg/dL — ABNORMAL LOW (ref 8.9–10.3)
Chloride: 109 mmol/L (ref 98–111)
Creatinine, Ser: 0.75 mg/dL (ref 0.61–1.24)
GFR, Estimated: >60 mL/min (ref >60)
Glucose, Bld: 93 mg/dL (ref 70–99)
Potassium: 5 mmol/L (ref 3.5–5.1)
Sodium: 139 mmol/L (ref 135–145)

## 2023-06-26 LAB — CBC WITH DIFFERENTIAL/PLATELET
Abs Immature Granulocytes: 0.07 10*3/uL (ref 0.00–0.07)
Basophils Absolute: 0 10*3/uL (ref 0.0–0.1)
Basophils Relative: 0 %
Eosinophils Absolute: 0.1 10*3/uL (ref 0.0–0.5)
Eosinophils Relative: 1 %
HCT: 31.3 % — ABNORMAL LOW (ref 39.0–52.0)
Hemoglobin: 9.3 g/dL — ABNORMAL LOW (ref 13.0–17.0)
Immature Granulocytes: 1 %
Lymphocytes Relative: 20 %
Lymphs Abs: 2.8 10*3/uL (ref 0.7–4.0)
MCH: 23.3 pg — ABNORMAL LOW (ref 26.0–34.0)
MCHC: 29.7 g/dL — ABNORMAL LOW (ref 30.0–36.0)
MCV: 78.3 fL — ABNORMAL LOW (ref 80.0–100.0)
Monocytes Absolute: 0.6 10*3/uL (ref 0.1–1.0)
Monocytes Relative: 4 %
Neutro Abs: 10.6 10*3/uL — ABNORMAL HIGH (ref 1.7–7.7)
Neutrophils Relative %: 74 %
Platelets: 573 10*3/uL — ABNORMAL HIGH (ref 150–400)
RBC: 4 MIL/uL — ABNORMAL LOW (ref 4.22–5.81)
RDW: 19.3 % — ABNORMAL HIGH (ref 11.5–15.5)
WBC: 14.1 10*3/uL — ABNORMAL HIGH (ref 4.0–10.5)
nRBC: 0 % (ref 0.0–0.2)

## 2023-06-26 LAB — MAGNESIUM: Magnesium: 1.8 mg/dL (ref 1.7–2.4)

## 2023-06-26 LAB — PROCALCITONIN: Procalcitonin: 0.71 ng/mL

## 2023-06-26 LAB — URINE CULTURE: Culture: 40000 — AB

## 2023-06-26 LAB — C-REACTIVE PROTEIN: CRP: 9 mg/dL — ABNORMAL HIGH (ref <1.0)

## 2023-06-26 LAB — PHOSPHORUS: Phosphorus: 2.8 mg/dL (ref 2.5–4.6)

## 2023-06-26 MED ORDER — SODIUM ZIRCONIUM CYCLOSILICATE 10 G PO PACK
10.0000 g | PACK | Freq: Once | ORAL | Status: AC
  Administered 2023-06-26: 10 g via ORAL
  Filled 2023-06-26: qty 1
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
## 2023-06-26 NOTE — Plan of Care (Signed)

## 2023-06-26 NOTE — Plan of Care (Signed)
  Problem: Health Behavior/Discharge Planning: Goal: Ability to manage health-related needs will improve Outcome: Progressing   Problem: Clinical Measurements: Goal: Ability to maintain clinical measurements within normal limits will improve Outcome: Progressing   Problem: Nutrition: Goal: Adequate nutrition will be maintained Outcome: Progressing   Problem: Pain Management: Goal: General experience of comfort will improve Outcome: Progressing

## 2023-06-26 NOTE — Progress Notes (Signed)
    [facility]    Date of Admission:  06/23/2023   Total days of antibiotics 4          ID: Luke Velasquez is a 22 y.o. male with paraplegia, osteomyelitis and pressure ulcers to hip, neurogenic bladder and depression Principal Problem:   Sepsis secondary to UTI [facility]) Active Problems:   Colostomy in place (HCC)   Paraplegia (HCC)   Neurogenic bladder   Pressure injury of skin   Sacral decubitus ulcer   Chronic osteomyelitis of pelvic region (HCC)   Acute blood loss anemia   History of DVT (deep vein thrombosis)   Thrombocytosis   Involuntary commitment   Insomnia due to medical condition    Subjective: Afebrile, seen by psychiatry yesterday  Medications:   feeding supplement  237 mL Oral BID BM   heparin injection (subcutaneous)  5,000 Units Subcutaneous Q8H   metroNIDAZOLE  500 mg Oral BID   traZODone  50 mg Oral QHS    Objective: Vital signs in last 24 hours: Temp:  [97.8 F (36.6 C)-98 F (36.7 C)] 98 F (36.7 C) (12/08 0007) Pulse Rate:  [82-92] 82 (12/08 0800) Resp:  [16-18] 17 (12/08 1202) BP: (107-116)/(66-78) 114/66 (12/08 0800) SpO2:  [100 %] 100 % (12/08 0600)  Physical Exam  Constitutional: He is oriented to person, place, and time. He appears well-developed and well-nourished. No distress.  HENT:  Mouth/Throat: Oropharynx is clear and moist. No oropharyngeal exudate.  Cardiovascular: Normal rate, regular rhythm and normal heart sounds. Exam reveals no gallop and no friction rub.  No murmur heard.  Pulmonary/Chest: Effort normal and breath sounds normal. No respiratory distress. He has no wheezes.  Abdominal: Soft. Bowel sounds are normal. He exhibits no distension. There is no tenderness.  Neurological: He is alert and oriented to person, place, and time. Paraplegia, musclewasting Skin: Skin is warm and dry. No rash noted. No erythema. Wounds as noted in chart Psychiatric: He has a normal mood and affect. His behavior is  normal.    Lab Results Recent Labs    06/25/23 0415 06/26/23 0555  WBC 26.2* 14.1*  HGB 8.9* 9.3*  HCT 29.5* 31.3*  NA 137 139  K 4.7 5.0  CL 110 109  CO2 21* 22  BUN 15 17  CREATININE 0.70 0.75   Liver Panel Recent Labs    06/24/23 0054  PROT 6.4*  ALBUMIN <1.5*  AST 49*  ALT 36  ALKPHOS 194*  BILITOT 0.2   Sedimentation Rate Recent Labs    06/24/23 1522  ESRSEDRATE 110*   C-Reactive Protein Recent Labs    06/24/23 2104 06/26/23 0555  CRP 18.0* 9.0*    Microbiology: Reviewed Ur cx 40K of ecoli  Studies/Results: No results found.   Assessment/Plan: Acute posterior iliac osteo = continue on cefepime plus oral metronidazole and oral doxycycline Will need picc line to do 4 wk of iv abtx then can transition to orals abtx to complete course  Decub ulcer/wounds = continue with hydrotherapy. Will need wound care on discharge  Neurogenic bladder = please have urology evaluate since he has often urinary leakage despite I and O cath QID.   Moderate Protein-calorie malnutrition = may benefit from supplementation with ensure drinks.increased calorie intake.   Depression = per psychiatry.  [facility] for Infectious Diseases Pager: [phone]  06/26/2023, 1:35 PM

## 2023-06-26 NOTE — Consult Note (Signed)
WOC team consulted for wounds and ostomy. This patient was seen by [facility] RN 06/24/2023 and orders placed.  Please see that note.  General surgery has also evaluated these wounds with no indication for surgical intervention at this time per their note.   Thank you,    Priscella Mann MSN, RN-BC, [employer] [phone]

## 2023-06-27 ENCOUNTER — Telehealth: Payer: Self-pay

## 2023-06-27 DIAGNOSIS — L89159 Pressure ulcer of sacral region, unspecified stage: Secondary | ICD-10-CM

## 2023-06-27 DIAGNOSIS — A419 Sepsis, unspecified organism: Secondary | ICD-10-CM | POA: Diagnosis not present

## 2023-06-27 DIAGNOSIS — N390 Urinary tract infection, site not specified: Secondary | ICD-10-CM | POA: Diagnosis not present

## 2023-06-27 DIAGNOSIS — M4628 Osteomyelitis of vertebra, sacral and sacrococcygeal region: Secondary | ICD-10-CM

## 2023-06-27 LAB — CBC WITH DIFFERENTIAL/PLATELET
Abs Immature Granulocytes: 0.08 10*3/uL — ABNORMAL HIGH (ref 0.00–0.07)
Basophils Absolute: 0 10*3/uL (ref 0.0–0.1)
Basophils Relative: 0 %
Eosinophils Absolute: 0.1 10*3/uL (ref 0.0–0.5)
Eosinophils Relative: 1 %
HCT: 25 % — ABNORMAL LOW (ref 39.0–52.0)
Hemoglobin: 7.4 g/dL — ABNORMAL LOW (ref 13.0–17.0)
Immature Granulocytes: 1 %
Lymphocytes Relative: 22 %
Lymphs Abs: 2.4 10*3/uL (ref 0.7–4.0)
MCH: 23.4 pg — ABNORMAL LOW (ref 26.0–34.0)
MCHC: 29.6 g/dL — ABNORMAL LOW (ref 30.0–36.0)
MCV: 79.1 fL — ABNORMAL LOW (ref 80.0–100.0)
Monocytes Absolute: 0.6 10*3/uL (ref 0.1–1.0)
Monocytes Relative: 5 %
Neutro Abs: 7.9 10*3/uL — ABNORMAL HIGH (ref 1.7–7.7)
Neutrophils Relative %: 71 %
Platelets: 632 10*3/uL — ABNORMAL HIGH (ref 150–400)
RBC: 3.16 MIL/uL — ABNORMAL LOW (ref 4.22–5.81)
RDW: 19.7 % — ABNORMAL HIGH (ref 11.5–15.5)
WBC: 11.1 10*3/uL — ABNORMAL HIGH (ref 4.0–10.5)
nRBC: 0 % (ref 0.0–0.2)

## 2023-06-27 LAB — BASIC METABOLIC PANEL
Anion gap: 6 (ref 5–15)
BUN: 14 mg/dL (ref 6–20)
CO2: 25 mmol/L (ref 22–32)
Calcium: 8 mg/dL — ABNORMAL LOW (ref 8.9–10.3)
Chloride: 107 mmol/L (ref 98–111)
Creatinine, Ser: 0.75 mg/dL (ref 0.61–1.24)
GFR, Estimated: >60 mL/min (ref >60)
Glucose, Bld: 90 mg/dL (ref 70–99)
Potassium: 4.2 mmol/L (ref 3.5–5.1)
Sodium: 138 mmol/L (ref 135–145)

## 2023-06-27 LAB — PHOSPHORUS: Phosphorus: 3.1 mg/dL (ref 2.5–4.6)

## 2023-06-27 LAB — MAGNESIUM: Magnesium: 1.6 mg/dL — ABNORMAL LOW (ref 1.7–2.4)

## 2023-06-27 LAB — C-REACTIVE PROTEIN: CRP: 4.8 mg/dL — ABNORMAL HIGH (ref <1.0)

## 2023-06-27 LAB — BRAIN NATRIURETIC PEPTIDE: B Natriuretic Peptide: 12.9 pg/mL (ref 0.0–100.0)

## 2023-06-27 LAB — PROCALCITONIN: Procalcitonin: 0.49 ng/mL

## 2023-06-27 MED ORDER — MAGNESIUM SULFATE 4 GM/100ML IV SOLN
4.0000 g | Freq: Once | INTRAVENOUS | Status: AC
  Administered 2023-06-27: 4 g via INTRAVENOUS
  Filled 2023-06-27: qty 100

## 2023-06-27 NOTE — Plan of Care (Signed)
  Problem: Clinical Measurements: Goal: Respiratory complications will improve Outcome: Progressing   Problem: Coping: Goal: Level of anxiety will decrease Outcome: Progressing   
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
## 2023-06-27 NOTE — Progress Notes (Incomplete)
PHARMACY CONSULT NOTE FOR:  OUTPATIENT  PARENTERAL ANTIBIOTIC THERAPY (OPAT)  Indication: Iliac osteo Regimen: Cefepime 2g IV every 8 hours + Doxycycline 100 mg po bid + Metronidazole End date: 07/21/23  IV antibiotic discharge orders are pended. To discharging provider:  please sign these orders via discharge navigator,  Select New Orders & click on the button choice - Manage This Unsigned Work.     Thank you for allowing pharmacy to be a part of this patient's care.  Georgina Pillion, PharmD, BCPS, BCIDP Infectious Diseases Clinical Pharmacist 06/28/2023 3:27 PM   **Pharmacist phone directory can now be found on [email or web address] ([email or web address]).  Listed under [facility].

## 2023-06-27 NOTE — Progress Notes (Signed)
[facility]   Reason for visit: Follow up on sacral decubitus ulcer, stage IV, infected Blood cultures remain ngtd from 12/6 Interval History: no fever > 48 hours, WBC down to 11.1 Mother at bedside   Physical Exam: Constitutional:  Vitals:   06/27/23 0100 06/27/23 0400  BP: 109/67 114/60  Pulse: (!) 110   Resp: 16 16  Temp: 97.9 F (36.6 C) 98.2 F (36.8 C)  SpO2: 97% 96%   patient appears in NAD Respiratory: Normal respiratory effort  Review of Systems: Constitutional: negative for fevers and chills  Lab Results  Component Value Date   WBC 11.1 (H) 06/27/2023   HGB 7.4 (L) 06/27/2023   HCT 25.0 (L) 06/27/2023   MCV 79.1 (L) 06/27/2023   PLT 632 (H) 06/27/2023    Lab Results  Component Value Date   CREATININE 0.75 06/27/2023   BUN 14 06/27/2023   NA 138 06/27/2023   K 4.2 06/27/2023   CL 107 06/27/2023   CO2 25 06/27/2023    Lab Results  Component Value Date   ALT 36 06/24/2023   AST 49 (H) 06/24/2023   ALKPHOS 194 (H) 06/24/2023     Microbiology: Recent Results (from the past 240 hour(s))  Blood Culture (routine x 2)     Status: None (Preliminary result)   Collection Time: 06/24/23 12:52 AM   Specimen: BLOOD RIGHT FOREARM  Result Value Ref Range Status   Specimen Description BLOOD RIGHT FOREARM  Final   Special Requests   Final    BOTTLES DRAWN AEROBIC ONLY Blood Culture results may not be optimal due to an inadequate volume of blood received in culture bottles   Culture   Final    NO GROWTH 3 DAYS Performed at [facility], [address] [place], [place] [address]    Report Status PENDING  Incomplete  Blood Culture (routine x 2)     Status: None (Preliminary result)   Collection Time: 06/24/23 12:54 AM   Specimen: BLOOD  Result Value Ref Range Status   Specimen Description BLOOD RIGHT ANTECUBITAL  Final   Special Requests   Final    BOTTLES DRAWN AEROBIC AND ANAEROBIC Blood Culture adequate volume    Culture   Final    NO GROWTH 3 DAYS Performed at [facility], [address], [place], [place] [address]    Report Status PENDING  Incomplete  Urine Culture     Status: Abnormal   Collection Time: 06/24/23  2:55 AM   Specimen: Urine, Catheterized  Result Value Ref Range Status   Specimen Description URINE, CATHETERIZED  Final   Special Requests   Final    NONE Performed at [facility], [address] [place], [place] [address]    Culture 40,000 COLONIES/mL ESCHERICHIA COLI (A)  Final   Report Status 06/26/2023 FINAL  Final   Organism ID, Bacteria ESCHERICHIA COLI (A)  Final      Susceptibility   Escherichia coli - MIC*    AMPICILLIN >=32 RESISTANT Resistant     CEFEPIME <=0.12 SENSITIVE Sensitive     CEFTRIAXONE <=0.25 SENSITIVE Sensitive     CIPROFLOXACIN >=4 RESISTANT Resistant     GENTAMICIN <=1 SENSITIVE Sensitive     IMIPENEM 0.5 SENSITIVE Sensitive     TRIMETH/SULFA <=20 SENSITIVE Sensitive     AMPICILLIN/SULBACTAM 16 INTERMEDIATE Intermediate     PIP/TAZO 64 INTERMEDIATE Intermediate ug/mL    * 40,000 COLONIES/mL ESCHERICHIA COLI  Impression/Plan:  1. Sacral decubitus ulcer infection and osteomyelitis on CT - has progressed since CT scan in July.  Concern for ongoing chronic infection of pelvis.   Long discussion had with him and his mother at the bedside. Healing of a chronic ulcer requires multiple interventions to include offloading, nutrition, avoidance of soiling and wound care.  In that setting, then antibiotics can offer benefit. Unfortunately, his albumin is poor at < 1.5, he previously has refused outpatient wound care, does not offload aggressively and does not seem interested in engaging in care to help him heal. Additionally he is a smoker.  I recommended IV antibiotics as outlined already along with above interventions emphasized to give him the best chance but will not improve without full efforts.    2.  Access - ok to place PICC line if he  is agreeable to treatment  I will place opat for him  Diagnosis: Pelvic osteomyelitis  Culture Result: none  Allergies  Allergen Reactions   Ivp Dye [Iodinated Contrast Media] Swelling    Eye itching and swelling    OPAT Orders Discharge antibiotics to be given via PICC line Discharge antibiotics: cefepime IV 2 grams every 8 hours + doxycycline oral 100 mg twice a day + metronidazole 500 mg po twice a day Per pharmacy protocol yes Duration: 4 weeks End Date: 07/21/23  [facility] Care Per Protocol: yes  Home health RN for IV administration and teaching; PICC line care and labs.    Labs weekly while on IV antibiotics: __x CBC with differential __ BMP _x_ CMP _x_ CRP _x_ ESR __ Vancomycin trough __ CK  __ Please pull PIC at completion of IV antibiotics _x_ Please leave PIC in place until doctor has seen patient or been notified  Fax weekly labs to [phone]  Clinic Follow Up Appt: 07/21/23 with Dr. Drue Second  @  1:45 pm   I have personally spent 65 minutes involved in face-to-face and non-face-to-face activities for this patient on the day of the visit. Professional time spent includes the following activities: Preparing to see the patient (review of tests), Obtaining and/or reviewing separately obtained history (admission/discharge record), Performing a medically appropriate examination and/or evaluation , Ordering medications/tests/procedures, referring and communicating with other health care professionals, Documenting clinical information in the EMR, Independently interpreting results (not separately reported), Communicating results to the patient/family/caregiver, Counseling and educating the patient/family/caregiver and Care coordination (not separately reported).

## 2023-06-27 NOTE — Progress Notes (Signed)
CSW received consult from MD to rescind IVC. Commitment Change form signed and uploaded to Epic and eCourts (Envelope # [id number]). Patient no longer under IVC.   Joaquin Courts, MSW, [facility]

## 2023-06-27 NOTE — Telephone Encounter (Signed)
Received call from Gerilyn Pilgrim, Pharmacist with [facility] regarding referral for IV antibiotics. Called for weekly lab order and for picc line report to be faxed to office. Informed jacob that picc line has not been placed yet, but once completed hospital staff would take  care of this.  Nothing further needed at this time.  Juanita Laster, RMA

## 2023-06-28 DIAGNOSIS — N390 Urinary tract infection, site not specified: Secondary | ICD-10-CM | POA: Diagnosis not present

## 2023-06-28 DIAGNOSIS — A419 Sepsis, unspecified organism: Secondary | ICD-10-CM | POA: Diagnosis not present

## 2023-06-28 LAB — BASIC METABOLIC PANEL
Anion gap: 8 (ref 5–15)
BUN: 12 mg/dL (ref 6–20)
CO2: 26 mmol/L (ref 22–32)
Calcium: 8.6 mg/dL — ABNORMAL LOW (ref 8.9–10.3)
Chloride: 104 mmol/L (ref 98–111)
Creatinine, Ser: 0.71 mg/dL (ref 0.61–1.24)
GFR, Estimated: >60 mL/min (ref >60)
Glucose, Bld: 95 mg/dL (ref 70–99)
Potassium: 4.3 mmol/L (ref 3.5–5.1)
Sodium: 138 mmol/L (ref 135–145)

## 2023-06-28 LAB — CBC WITH DIFFERENTIAL/PLATELET
Abs Immature Granulocytes: 0.08 10*3/uL — ABNORMAL HIGH (ref 0.00–0.07)
Basophils Absolute: 0 10*3/uL (ref 0.0–0.1)
Basophils Relative: 0 %
Eosinophils Absolute: 0.2 10*3/uL (ref 0.0–0.5)
Eosinophils Relative: 2 %
HCT: 27.7 % — ABNORMAL LOW (ref 39.0–52.0)
Hemoglobin: 8.1 g/dL — ABNORMAL LOW (ref 13.0–17.0)
Immature Granulocytes: 1 %
Lymphocytes Relative: 19 %
Lymphs Abs: 2.5 10*3/uL (ref 0.7–4.0)
MCH: 23.3 pg — ABNORMAL LOW (ref 26.0–34.0)
MCHC: 29.2 g/dL — ABNORMAL LOW (ref 30.0–36.0)
MCV: 79.6 fL — ABNORMAL LOW (ref 80.0–100.0)
Monocytes Absolute: 0.6 10*3/uL (ref 0.1–1.0)
Monocytes Relative: 5 %
Neutro Abs: 9.8 10*3/uL — ABNORMAL HIGH (ref 1.7–7.7)
Neutrophils Relative %: 73 %
Platelets: 727 10*3/uL — ABNORMAL HIGH (ref 150–400)
RBC: 3.48 MIL/uL — ABNORMAL LOW (ref 4.22–5.81)
RDW: 20.8 % — ABNORMAL HIGH (ref 11.5–15.5)
WBC: 13.2 10*3/uL — ABNORMAL HIGH (ref 4.0–10.5)
nRBC: 0 % (ref 0.0–0.2)

## 2023-06-28 LAB — TYPE AND SCREEN
ABO/RH(D): O POS
Antibody Screen: NEGATIVE

## 2023-06-28 LAB — C-REACTIVE PROTEIN: CRP: 4.3 mg/dL — ABNORMAL HIGH (ref <1.0)

## 2023-06-28 LAB — PHOSPHORUS: Phosphorus: 3 mg/dL (ref 2.5–4.6)

## 2023-06-28 LAB — PROCALCITONIN: Procalcitonin: 0.3 ng/mL

## 2023-06-28 LAB — BRAIN NATRIURETIC PEPTIDE: B Natriuretic Peptide: 18.4 pg/mL (ref 0.0–100.0)

## 2023-06-28 LAB — MAGNESIUM: Magnesium: 1.8 mg/dL (ref 1.7–2.4)

## 2023-06-28 MED ORDER — DOXYCYCLINE HYCLATE 100 MG PO TABS
100.0000 mg | ORAL_TABLET | Freq: Two times a day (BID) | ORAL | Status: DC
  Administered 2023-06-28 – 2023-07-01 (×7): 100 mg via ORAL
  Filled 2023-06-28 (×7): qty 1

## 2023-06-28 MED ORDER — MEDIHONEY WOUND/BURN DRESSING EX PSTE
1.0000 | PASTE | Freq: Every day | CUTANEOUS | Status: DC
  Filled 2023-06-28: qty 44

## 2023-06-28 MED ORDER — MAGNESIUM SULFATE IN D5W 1-5 GM/100ML-% IV SOLN
1.0000 g | Freq: Once | INTRAVENOUS | Status: AC
  Administered 2023-06-28: 1 g via INTRAVENOUS
  Filled 2023-06-28: qty 100

## 2023-06-28 MED ORDER — DOXYCYCLINE HYCLATE 100 MG PO TABS: 100.0000 mg | ORAL_TABLET | Freq: Two times a day (BID) | ORAL | Status: DC
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
## 2023-06-28 NOTE — Progress Notes (Signed)
Recommended PICC placement for prolonged IV therapies with limited venous access and provider Ok with PICC but patient refused.

## 2023-06-28 NOTE — Progress Notes (Signed)
Physical Therapy Wound Treatment Patient Details  Name: Luke Velasquez MRN: [id number] Date of Birth: [birth year]  Today's Date: 06/28/2023 Time: 4098-1191 Time Calculation (min): 23 min  Subjective  Subjective Assessment Subjective: Asking how long it will take to heal L hip wound Patient and Family Stated Goals: to heal wound Date of Onset:  (unknown) Prior Treatments: dressing changes, OP wound care per chart  Pain Score:   none  Wound Assessment     Pressure Injury 06/24/23 Hip Left Stage 4 - Full thickness tissue loss with exposed bone, tendon or muscle. (Active)  Wound Image  06/25/23 1300  Dressing Type Foam - Lift dressing to assess site every shift;Gauze (Comment);Barrier Film (skin prep);Other (Comment) 06/28/23 1013  Dressing Clean, Dry, Intact 06/28/23 1013  Dressing Change Frequency Every 3 days 06/28/23 1013  State of Healing Eschar 06/28/23 1013  Site / Wound Assessment Brown;Pink;Yellow 06/28/23 1013  % Wound base Red or Granulating 20% 06/28/23 1013  % Wound base Yellow/Fibrinous Exudate 80% 06/28/23 1013  % Wound base Black/Eschar 70% 06/25/23 1300  % Wound base Other/Granulation Tissue (Comment) 0% 06/25/23 1300  Peri-wound Assessment Intact 06/28/23 1013  Wound Length (cm) 9.2 cm 06/25/23 1300  Wound Width (cm) 9 cm 06/25/23 1300  Wound Depth (cm) 0.2 cm 06/25/23 1300  Wound Surface Area (cm^2) 82.8 cm^2 06/25/23 1300  Wound Volume (cm^3) 16.56 cm^3 06/25/23 1300  Tunneling (cm) 0 06/25/23 1300  Undermining (cm) 0 06/25/23 1300  Margins Unattached edges (unapproximated) 06/28/23 1013  Drainage Amount Scant 06/28/23 1013  Drainage Description Serous 06/28/23 1013  Treatment Debridement (Selective);Irrigation 06/28/23 1013      Selective Debridement (non-excisional) Selective Debridement (non-excisional) - Location: L hip Selective Debridement (non-excisional) - Tools Used: Forceps, Scalpel Selective Debridement (non-excisional) - Tissue Removed: Black  eschar and yellow eschar, slough    Wound Assessment and Plan  Wound Therapy - Assess/Plan/Recommendations Wound Therapy - Clinical Statement: Final parts of black necrotic tissue removed with yellow eschar and slough underlying. Able to remove some yellow tissue, however more underlying/remaining. Will continue to benefit from hydrotherapy to remove necrotic tissue and promote wound healing. Wound Therapy - Functional Problem List: paraplegia affecting mobility Factors Delaying/Impairing Wound Healing: Altered sensation, Infection - systemic/local, Immobility Hydrotherapy Plan: Debridement, Dressing change, Patient/family education Wound Therapy - Frequency: 2X / week Wound Therapy - Follow Up Recommendations: dressing changes by family/patient, dressing changes by RN  Wound Therapy Goals- Improve the function of patient's integumentary system by progressing the wound(s) through the phases of wound healing (inflammation - proliferation - remodeling) by: Wound Therapy Goals - Improve the function of patient's integumentary system by progressing the wound(s) through the phases of wound healing by: Decrease Necrotic Tissue to: 10% Decrease Necrotic Tissue - Progress: Progressing toward goal Increase Granulation Tissue to: 90% Increase Granulation Tissue - Progress: Progressing toward goal Improve Drainage Characteristics: Min, Serous Improve Drainage Characteristics - Progress: Met Patient/Family will be able to : perform dressing changes independently Patient/Family Instruction Goal - Progress: Progressing toward goal Goals/treatment plan/discharge plan were made with and agreed upon by patient/family: Yes Time For Goal Achievement: 7 days Wound Therapy - Potential for Goals: Good  Goals will be updated until maximal potential achieved or discharge criteria met.  Discharge criteria: when goals achieved, discharge from hospital, MD decision/surgical intervention, no progress towards goals,  refusal/missing three consecutive treatments without notification or medical reason.  GP     Charges PT Wound Care Charges $Wound Debridement up to 20 cm: < or equal to  20 cm $ Wound Debridement each add'l 20 sqcm: 3 $PT Hydrotherapy Visit: 1 Visit      Jerolyn Center, PT Acute Rehabilitation Services  Office [phone]   Zena Amos 06/28/2023, 10:28 AM

## 2023-06-28 NOTE — Plan of Care (Signed)
  Problem: Health Behavior/Discharge Planning: Goal: Ability to manage health-related needs will improve Outcome: Not Progressing   

## 2023-06-28 NOTE — TOC Progression Note (Signed)
Transition of Care [facility]) - Progression Note    Patient Details  Name: Luke Velasquez MRN: [id number] Date of Birth: July 31, 2000  Transition of Care [facility]) CM/SW Contact  Gordy Clement, RN Phone Number: 06/28/2023, 3:51 PM  Clinical Narrative:     RNCM met with patient bedside to discuss  dc planning. Patient stated he lives with his Mother and there are  "a couple of kids" in the house. Patient stated his Mother had been in the room earlier and they talked about the PICC line.   He stated several times he was not going to "do it". RNCM explained the importance of the IV ABX and how they will help him get better. RNCM asked patient to please think about it and left patient room. TOC will continue to follow patient for any additional discharge needs           Expected Discharge Plan and Services                                               Social Determinants of Health (SDOH) Interventions SDOH Screenings   Food Insecurity: No Food Insecurity (06/24/2023)  Housing: Low Risk  (06/24/2023)  Transportation Needs: No Transportation Needs (06/24/2023)  Utilities: Not At Risk (06/24/2023)  Depression (PHQ2-9): Low Risk  (06/27/2020)  Recent Concern: Depression (PHQ2-9) - Medium Risk (05/16/2020)  Tobacco Use: High Risk (06/24/2023)    Readmission Risk Interventions     No data to display

## 2023-06-29 DIAGNOSIS — A419 Sepsis, unspecified organism: Secondary | ICD-10-CM | POA: Diagnosis not present

## 2023-06-29 DIAGNOSIS — M86659 Other chronic osteomyelitis, unspecified thigh: Secondary | ICD-10-CM | POA: Diagnosis not present

## 2023-06-29 DIAGNOSIS — N390 Urinary tract infection, site not specified: Secondary | ICD-10-CM | POA: Diagnosis not present

## 2023-06-29 LAB — CULTURE, BLOOD (ROUTINE X 2)
Culture: NO GROWTH
Culture: NO GROWTH
Special Requests: ADEQUATE

## 2023-06-29 MED ORDER — CIPROFLOXACIN HCL 750 MG PO TABS
750.0000 mg | ORAL_TABLET | Freq: Two times a day (BID) | ORAL | Status: DC
  Administered 2023-06-29 – 2023-07-01 (×4): 750 mg via ORAL
  Filled 2023-06-29 (×6): qty 1

## 2023-06-29 NOTE — Plan of Care (Signed)
  Problem: Safety: Goal: Non-violent Restraint(s) Outcome: Progressing   Problem: Education: Goal: Knowledge of General Education information will improve Description: Including pain rating scale, medication(s)/side effects and non-pharmacologic comfort measures Outcome: Progressing   Problem: Clinical Measurements: Goal: Diagnostic test results will improve Outcome: Progressing Goal: Respiratory complications will improve Outcome: Progressing   Problem: Activity: Goal: Risk for activity intolerance will decrease Outcome: Progressing   Problem: Coping: Goal: Level of anxiety will decrease Outcome: Progressing   Problem: Pain Management: Goal: General experience of comfort will improve Outcome: Progressing

## 2023-06-29 NOTE — Plan of Care (Signed)

## 2023-06-29 NOTE — Progress Notes (Signed)
Patient refused Am lab draw.
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
## 2023-06-29 NOTE — Progress Notes (Addendum)
[facility]  Date of Admission:  06/23/2023     Total days of antibiotics 7         ASSESSMENT:  [name] continues to refuse PICC line placement for antibiotic administration related to chronic osteomyelitis of the pelvis. Educated that this is the standard of care for infections such as this. Discussed plan of care to change antibiotics to Ciprofloxacin 750 mg PO bid along with Doxycycline 100 mg PO bid and Metronidazole 500 mg PO bid for a total of 6 weeks. Discussed importance of site off loading, protein intake and cessation of tobacco to give this the best opportunity to heal. Will arrange follow up in ID clinic. Remaining medical and supportive care per Internal Medicine.   PLAN:  Change antibiotics to Ciprofloxacin 750 mg PO bid, Doxycycline 100 mg PO bid and Metronidazole 500 mg PO bid.  Continue wound care.  Emphasize site off loading, increasing protein and tobacco cessation.  Follow up in ID clinic on 1/2 as scheduled with Dr. Drue Second Remaining medical and supportive care per Internal Medicine.   ID will sign off. Please re-consult if needed.   Principal Problem:   Sepsis secondary to UTI [facility]) Active Problems:   Colostomy in place (HCC)   Paraplegia (HCC)   Neurogenic bladder   Pressure injury of skin   Sacral decubitus ulcer   Chronic osteomyelitis of pelvic region (HCC)   Acute blood loss anemia   History of DVT (deep vein thrombosis)   Thrombocytosis   Involuntary commitment   Insomnia due to medical condition    doxycycline  100 mg Oral BID PC   feeding supplement  237 mL Oral BID BM   heparin injection (subcutaneous)  5,000 Units Subcutaneous Q8H   metroNIDAZOLE  500 mg Oral BID   traZODone  50 mg Oral QHS    SUBJECTIVE:  Afebrile overnight with no acute events. Declines PICC line. Mother at bedside.   Allergies  Allergen Reactions   Ivp Dye [Iodinated Contrast Media] Swelling    Eye itching and swelling      Review of Systems: Review of Systems  Constitutional:  Negative for chills, fever and weight loss.  Respiratory:  Negative for cough, shortness of breath and wheezing.   Cardiovascular:  Negative for chest pain and leg swelling.  Gastrointestinal:  Negative for abdominal pain, constipation, diarrhea, nausea and vomiting.  Skin:  Negative for rash.      OBJECTIVE: Vitals:   06/28/23 1941 06/28/23 2314 06/29/23 0822 06/29/23 0823  BP: 115/72 104/62 112/80 112/80  Pulse: 98 98 92 92  Resp: 18 17 18 18   Temp: 98 F (36.7 C) 97.8 F (36.6 C) 97.6 F (36.4 C) 97.6 F (36.4 C)  TempSrc: Oral Oral Oral   SpO2: 100% 99% 100% 100%  Weight:      Height:       Body mass index is 18.26 kg/m.  Physical Exam Constitutional:      General: He is not in acute distress.    Appearance: He is well-developed.  Cardiovascular:     Rate and Rhythm: Normal rate and regular rhythm.     Heart sounds: Normal heart sounds.  Pulmonary:     Effort: Pulmonary effort is normal.     Breath sounds: Normal breath sounds.  Skin:    General: Skin is warm and dry.  Neurological:     Mental Status: He is alert.     Lab Results Lab Results  Component  Value Date   WBC 13.2 (H) 06/28/2023   HGB 8.1 (L) 06/28/2023   HCT 27.7 (L) 06/28/2023   MCV 79.6 (L) 06/28/2023   PLT 727 (H) 06/28/2023    Lab Results  Component Value Date   CREATININE 0.71 06/28/2023   BUN 12 06/28/2023   NA 138 06/28/2023   K 4.3 06/28/2023   CL 104 06/28/2023   CO2 26 06/28/2023    Lab Results  Component Value Date   ALT 36 06/24/2023   AST 49 (H) 06/24/2023   ALKPHOS 194 (H) 06/24/2023   BILITOT 0.2 06/24/2023     Microbiology: Recent Results (from the past 240 hour(s))  Blood Culture (routine x 2)     Status: None   Collection Time: 06/24/23 12:52 AM   Specimen: BLOOD RIGHT FOREARM  Result Value Ref Range Status   Specimen Description BLOOD RIGHT FOREARM  Final   Special Requests   Final     BOTTLES DRAWN AEROBIC ONLY Blood Culture results may not be optimal due to an inadequate volume of blood received in culture bottles   Culture   Final    NO GROWTH 5 DAYS Performed at [facility], [address] [place], [place] [address]    Report Status 06/29/2023 FINAL  Final  Blood Culture (routine x 2)     Status: None   Collection Time: 06/24/23 12:54 AM   Specimen: BLOOD  Result Value Ref Range Status   Specimen Description BLOOD RIGHT ANTECUBITAL  Final   Special Requests   Final    BOTTLES DRAWN AEROBIC AND ANAEROBIC Blood Culture adequate volume   Culture   Final    NO GROWTH 5 DAYS Performed at [facility], [address], [place], [place] [address]    Report Status 06/29/2023 FINAL  Final  Urine Culture     Status: Abnormal   Collection Time: 06/24/23  2:55 AM   Specimen: Urine, Catheterized  Result Value Ref Range Status   Specimen Description URINE, CATHETERIZED  Final   Special Requests   Final    NONE Performed at [facility], [address] [place], [place] [address]    Culture 40,000 COLONIES/mL ESCHERICHIA COLI (A)  Final   Report Status 06/26/2023 FINAL  Final   Organism ID, Bacteria ESCHERICHIA COLI (A)  Final      Susceptibility   Escherichia coli - MIC*    AMPICILLIN >=32 RESISTANT Resistant     CEFEPIME <=0.12 SENSITIVE Sensitive     CEFTRIAXONE <=0.25 SENSITIVE Sensitive     CIPROFLOXACIN >=4 RESISTANT Resistant     GENTAMICIN <=1 SENSITIVE Sensitive     IMIPENEM 0.5 SENSITIVE Sensitive     TRIMETH/SULFA <=20 SENSITIVE Sensitive     AMPICILLIN/SULBACTAM 16 INTERMEDIATE Intermediate     PIP/TAZO 64 INTERMEDIATE Intermediate ug/mL    * 40,000 COLONIES/mL ESCHERICHIA COLI     Marcos Eke, NP [facility]  06/29/2023  3:01 PM

## 2023-06-30 DIAGNOSIS — N390 Urinary tract infection, site not specified: Secondary | ICD-10-CM | POA: Diagnosis not present

## 2023-06-30 DIAGNOSIS — A419 Sepsis, unspecified organism: Secondary | ICD-10-CM | POA: Diagnosis not present

## 2023-06-30 LAB — CBC WITH DIFFERENTIAL/PLATELET
Abs Immature Granulocytes: 0.08 10*3/uL — ABNORMAL HIGH (ref 0.00–0.07)
Basophils Absolute: 0 10*3/uL (ref 0.0–0.1)
Basophils Relative: 0 %
Eosinophils Absolute: 0.1 10*3/uL (ref 0.0–0.5)
Eosinophils Relative: 1 %
HCT: 28.3 % — ABNORMAL LOW (ref 39.0–52.0)
Hemoglobin: 8.3 g/dL — ABNORMAL LOW (ref 13.0–17.0)
Immature Granulocytes: 1 %
Lymphocytes Relative: 24 %
Lymphs Abs: 2.2 10*3/uL (ref 0.7–4.0)
MCH: 23.7 pg — ABNORMAL LOW (ref 26.0–34.0)
MCHC: 29.3 g/dL — ABNORMAL LOW (ref 30.0–36.0)
MCV: 80.9 fL (ref 80.0–100.0)
Monocytes Absolute: 0.7 10*3/uL (ref 0.1–1.0)
Monocytes Relative: 8 %
Neutro Abs: 5.8 10*3/uL (ref 1.7–7.7)
Neutrophils Relative %: 66 %
Platelets: 651 10*3/uL — ABNORMAL HIGH (ref 150–400)
RBC: 3.5 MIL/uL — ABNORMAL LOW (ref 4.22–5.81)
RDW: 22.5 % — ABNORMAL HIGH (ref 11.5–15.5)
WBC: 8.9 10*3/uL (ref 4.0–10.5)
nRBC: 0 % (ref 0.0–0.2)

## 2023-06-30 LAB — PROCALCITONIN: Procalcitonin: 0.13 ng/mL

## 2023-06-30 LAB — BASIC METABOLIC PANEL
Anion gap: 6 (ref 5–15)
BUN: 12 mg/dL (ref 6–20)
CO2: 29 mmol/L (ref 22–32)
Calcium: 8.5 mg/dL — ABNORMAL LOW (ref 8.9–10.3)
Chloride: 104 mmol/L (ref 98–111)
Creatinine, Ser: 0.58 mg/dL — ABNORMAL LOW (ref 0.61–1.24)
GFR, Estimated: >60 mL/min (ref >60)
Glucose, Bld: 97 mg/dL (ref 70–99)
Potassium: 4.1 mmol/L (ref 3.5–5.1)
Sodium: 139 mmol/L (ref 135–145)

## 2023-06-30 MED ORDER — HYDROCODONE-ACETAMINOPHEN 5-325 MG PO TABS
2.0000 | ORAL_TABLET | ORAL | Status: DC | PRN
  Administered 2023-06-30 – 2023-07-01 (×3): 2 via ORAL
  Filled 2023-06-30 (×3): qty 2

## 2023-06-30 NOTE — Consult Note (Addendum)
WOC Nurse Consult Note: Reason for Consult: Requested to assess the Left heels. Assess the right heels as well, notice a deep pressure injury 2x2cm. Performed the first consult 12/06 multiple wounds. Wound type: L heel pressure injury stage 3 Pressure Injury POA: Yes Obs: His mother confirm that wound was present before to the bed nurse. Measurement: 2x1.5x.1 Wound bed: yellow 80%, red 20%, cover by crust. Drainage (amount, consistency, odor) low amount, no odor Periwound: intact, dry Dressing procedure/placement/frequency: Apply moisture cream on the intact skin on the lower leg and feet.  Left Heel: Apply xeroform (change daily) on the wound bed, cover with foam dressing (change every 3 days). Right heel: Apply foam dressing, change every 3 days Use Prevalon boots on both heels. Pt was not using.  WOC team will not plan to follow further.  Please reconsult if further assistance is needed. Thank-you,  Denyse Amass BSN, RN, [employer], WOC  (Pager: [phone])

## 2023-06-30 NOTE — Progress Notes (Signed)
OT Cancellation Note  Patient Details Name: Luke Velasquez MRN: [id number] DOB: [birth year]   Cancelled Treatment:    Reason Eval/Treat Not Completed: Patient declined, no reason specified (Pt declined therapy, reports he will be going home tomorrow. Discussed with pt the benefits of mobility and being active to relief pressure wounds and maintain strength) Had a 20 min therapeutic conversation with pt regarding activities of interest after DC and the need for him to continue caring for himself at home to promote a positive recover, he verbalized understanding. Also discussed with pt his refusals of certain care (such as lab values), discussed with pt the importance of routine work in acute stay and encourage pt to ask questions if he needed to fully understanding the importance behind certain tasks. OT will follow-up with pt as able.  06/30/2023  AB, OTR/L  Acute Rehabilitation Services  Office: [phone]   Tristan Schroeder 06/30/2023, 5:31 PM

## 2023-06-30 NOTE — Plan of Care (Signed)

## 2023-06-30 NOTE — Progress Notes (Signed)
   06/30/23 1648  Spiritual Encounters  Type of Visit Attempt (pt unavailable)  Care provided to: Pt not available  Referral source Nurse (RN/NT/LPN)  Reason for visit Routine spiritual support  OnCall Visit No   Chaplain knocked on patient's door and said his name several times when entering the room. Patient did not respond and appeared to be sleeping.   Arlyce Dice, Chaplain Resident [phone]

## 2023-06-30 NOTE — TOC Transition Note (Signed)
Transition of Care [facility]) - Discharge Note   Patient Details  Name: Luke Velasquez MRN: [id number] Date of Birth: 11/06/2000  Transition of Care [facility]) CM/SW Contact:  Gordy Clement, RN Phone Number: 06/30/2023, 3:29 PM   Clinical Narrative:     Late entry   RNCM met with patient and mother at bedside  12/12 .  Patient is still refusing PICC for home ABX. RNCM was unable to secure any home health due to the fact that patient has refused care and has been non adherent. Mom and [employer]. Mom is going to be educated today by floor RN on wound care for dc. Patient refusing hospital bed with special air mattress  stating he wants his queen size bed for space to move around. RNCM shared info from resources that she can purchase air appropriate surface mattress herself for queen size bed. Patient will require PTAR transportation home. Waiting on confirmation that dressing changes and Mom's education has been completed          Patient Goals and CMS Choice            Discharge Placement                       Discharge Plan and Services Additional resources added to the After Visit Summary for                                       Social Drivers of Health (SDOH) Interventions SDOH Screenings   Food Insecurity: No Food Insecurity (06/24/2023)  Housing: Low Risk  (06/24/2023)  Transportation Needs: No Transportation Needs (06/24/2023)  Utilities: Not At Risk (06/24/2023)  Depression (PHQ2-9): Low Risk  (06/27/2020)  Recent Concern: Depression (PHQ2-9) - Medium Risk (05/16/2020)  Tobacco Use: High Risk (06/24/2023)     Readmission Risk Interventions     No data to display
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
## 2023-07-01 ENCOUNTER — Other Ambulatory Visit (HOSPITAL_COMMUNITY): Payer: Self-pay

## 2023-07-01 DIAGNOSIS — A419 Sepsis, unspecified organism: Secondary | ICD-10-CM | POA: Diagnosis not present

## 2023-07-01 DIAGNOSIS — N390 Urinary tract infection, site not specified: Secondary | ICD-10-CM | POA: Diagnosis not present

## 2023-07-01 MED ORDER — TRAZODONE HCL 50 MG PO TABS
50.0000 mg | ORAL_TABLET | Freq: Every day | ORAL | 0 refills | Status: DC
  Filled 2023-07-01: qty 30, 30d supply, fill #0

## 2023-07-01 MED ORDER — METRONIDAZOLE 500 MG PO TABS
500.0000 mg | ORAL_TABLET | Freq: Two times a day (BID) | ORAL | 0 refills | Status: DC
  Filled 2023-07-01: qty 60, 30d supply, fill #0

## 2023-07-01 MED ORDER — DOXYCYCLINE HYCLATE 100 MG PO TABS
100.0000 mg | ORAL_TABLET | Freq: Two times a day (BID) | ORAL | 0 refills | Status: AC
  Filled 2023-07-01: qty 70, 35d supply, fill #0

## 2023-07-01 MED ORDER — CIPROFLOXACIN HCL 750 MG PO TABS
750.0000 mg | ORAL_TABLET | Freq: Two times a day (BID) | ORAL | 0 refills | Status: AC
  Filled 2023-07-01: qty 70, 35d supply, fill #0

## 2023-07-01 MED ORDER — METRONIDAZOLE 500 MG PO TABS
500.0000 mg | ORAL_TABLET | Freq: Two times a day (BID) | ORAL | 0 refills | Status: AC
  Filled 2023-07-01: qty 70, 35d supply, fill #0

## 2023-07-01 MED ORDER — DOXYCYCLINE HYCLATE 100 MG PO TABS
100.0000 mg | ORAL_TABLET | Freq: Two times a day (BID) | ORAL | 0 refills | Status: DC
  Filled 2023-07-01: qty 60, 30d supply, fill #0

## 2023-07-01 MED ORDER — CIPROFLOXACIN HCL 750 MG PO TABS
750.0000 mg | ORAL_TABLET | Freq: Two times a day (BID) | ORAL | 0 refills | Status: DC
  Filled 2023-07-01: qty 60, 30d supply, fill #0

## 2023-07-01 MED ORDER — HYDROCODONE-ACETAMINOPHEN 5-325 MG PO TABS
1.0000 | ORAL_TABLET | Freq: Four times a day (QID) | ORAL | 0 refills | Status: AC | PRN
  Filled 2023-07-01: qty 20, 7d supply, fill #0

## 2023-07-01 NOTE — Discharge Instructions (Signed)
Follow with Primary MD   Disposition Home    Diet: Regular Diet  On your next visit with your primary care physician please Get Medicines reviewed and adjusted.   Please request your Prim.MD to go over all Hospital Tests and Procedure/Radiological results at the follow up, please get all Hospital records sent to your Prim MD by signing hospital release before you go home.    Please note  You were cared for by a hospitalist during your hospital stay. If you have any questions about your discharge medications or the care you received while you were in the hospital after you are discharged, you can call the unit and asked to speak with the hospitalist on call if the hospitalist that took care of you is not available. Once you are discharged, your primary care physician will handle any further medical issues. Please note that NO REFILLS for any discharge medications will be authorized once you are discharged, as it is imperative that you return to your primary care physician (or establish a relationship with a primary care physician if you do not have one) for your aftercare needs so that they can reassess your need for medications and monitor your lab values.

## 2023-07-01 NOTE — Plan of Care (Signed)
  Problem: Safety: Goal: Non-violent Restraint(s) Outcome: Completed/Met   Problem: Education: Goal: Knowledge of General Education information will improve Description: Including pain rating scale, medication(s)/side effects and non-pharmacologic comfort measures Outcome: Completed/Met   Problem: Health Behavior/Discharge Planning: Goal: Ability to manage health-related needs will improve Outcome: Completed/Met   Problem: Clinical Measurements: Goal: Ability to maintain clinical measurements within normal limits will improve Outcome: Completed/Met Goal: Will remain free from infection Outcome: Completed/Met Goal: Diagnostic test results will improve Outcome: Completed/Met Goal: Respiratory complications will improve Outcome: Completed/Met Goal: Cardiovascular complication will be avoided Outcome: Completed/Met   Problem: Activity: Goal: Risk for activity intolerance will decrease Outcome: Completed/Met   Problem: Nutrition: Goal: Adequate nutrition will be maintained Outcome: Completed/Met   Problem: Coping: Goal: Level of anxiety will decrease Outcome: Completed/Met   Problem: Elimination: Goal: Will not experience complications related to bowel motility Outcome: Completed/Met Goal: Will not experience complications related to urinary retention Outcome: Completed/Met   Problem: Pain Management: Goal: General experience of comfort will improve Outcome: Completed/Met   Problem: Safety: Goal: Ability to remain free from injury will improve Outcome: Completed/Met   Problem: Skin Integrity: Goal: Risk for impaired skin integrity will decrease Outcome: Completed/Met

## 2023-07-01 NOTE — Discharge Summary (Signed)
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  HGB 8.9* 9.3* 7.4* 8.1* 8.3*  HCT 29.5* 31.3* 25.0* 27.7* 28.3*  MCV 78.2* 78.3* 79.1* 79.6* 80.9  PLT 556* 573* 632* 727* 651*   Cardiac Enzymes: No results for input(s): "CKTOTAL", "CKMB", "CKMBINDEX", "TROPONINI" in the last 168 hours. BNP: Invalid input(s): "POCBNP" CBG: No results for input(s): "GLUCAP" in the last 168 hours. D-Dimer No results for input(s): "DDIMER" in the last 72 hours. Hgb A1c No results for input(s): "HGBA1C" in the last 72 hours. Lipid Profile No results for input(s): "CHOL", "HDL", "LDLCALC", "TRIG", "CHOLHDL", "LDLDIRECT" in the last 72 hours. Thyroid function studies No results for input(s): "TSH", "T4TOTAL", "T3FREE", "THYROIDAB" in the last 72 hours.  Invalid input(s): "FREET3" Anemia work up No results for input(s): "VITAMINB12", "FOLATE", "FERRITIN", "TIBC", "IRON", "RETICCTPCT" in the last 72 hours. Urinalysis    Component Value  Date/Time   COLORURINE BROWN (A) 06/24/2023 0255   APPEARANCEUR TURBID (A) 06/24/2023 0255   LABSPEC  06/24/2023 0255    TEST NOT REPORTED DUE TO COLOR INTERFERENCE OF URINE PIGMENT   PHURINE  06/24/2023 0255    TEST NOT REPORTED DUE TO COLOR INTERFERENCE OF URINE PIGMENT   GLUCOSEU (A) 06/24/2023 0255    TEST NOT REPORTED DUE TO COLOR INTERFERENCE OF URINE PIGMENT   HGBUR (A) 06/24/2023 0255    TEST NOT REPORTED DUE TO COLOR INTERFERENCE OF URINE PIGMENT   BILIRUBINUR (A) 06/24/2023 0255    TEST NOT REPORTED DUE TO COLOR INTERFERENCE OF URINE PIGMENT   KETONESUR (A) 06/24/2023 0255    TEST NOT REPORTED DUE TO COLOR INTERFERENCE OF URINE PIGMENT   PROTEINUR (A) 06/24/2023 0255    TEST NOT REPORTED DUE TO COLOR INTERFERENCE OF URINE PIGMENT   NITRITE (A) 06/24/2023 0255    TEST NOT REPORTED DUE TO COLOR INTERFERENCE OF URINE PIGMENT   LEUKOCYTESUR (A) 06/24/2023 0255    TEST NOT REPORTED DUE TO COLOR INTERFERENCE OF URINE PIGMENT   Sepsis Labs Recent Labs  Lab 06/26/23 0555 06/27/23 0503 06/28/23 1912 06/30/23 1901  WBC 14.1* 11.1* 13.2* 8.9   Microbiology Recent Results (from the past 240 hours)  Blood Culture (routine x 2)     Status: None   Collection Time: 06/24/23 12:52 AM   Specimen: BLOOD RIGHT FOREARM  Result Value Ref Range Status   Specimen Description BLOOD RIGHT FOREARM  Final   Special Requests   Final    BOTTLES DRAWN AEROBIC ONLY Blood Culture results may not be optimal due to an inadequate volume of blood received in culture bottles   Culture   Final    NO GROWTH 5 DAYS Performed at [facility], [address], [place], [place]  [address]    Report Status 06/29/2023 FINAL  Final  Blood Culture (routine x 2)     Status: None   Collection Time: 06/24/23 12:54 AM   Specimen: BLOOD  Result Value Ref Range Status   Specimen Description BLOOD RIGHT ANTECUBITAL  Final   Special Requests   Final    BOTTLES DRAWN AEROBIC AND ANAEROBIC Blood Culture  adequate volume   Culture   Final    NO GROWTH 5 DAYS Performed at [facility], [address], [place], [place] [address]    Report Status 06/29/2023 FINAL  Final  Urine Culture     Status: Abnormal   Collection Time: 06/24/23  2:55 AM   Specimen: Urine, Catheterized  Result Value Ref Range Status   Specimen Description URINE, CATHETERIZED  Final   Special Requests   Final    NONE Performed at [facility], [address], [place], [place] [address]    Culture 40,000 COLONIES/mL ESCHERICHIA COLI (A)  Final   Report Status 06/26/2023 FINAL  Final   Organism ID, Bacteria ESCHERICHIA COLI (A)  Final      Susceptibility   Escherichia coli - MIC*    AMPICILLIN >=32 RESISTANT Resistant     CEFEPIME <=0.12 SENSITIVE Sensitive     CEFTRIAXONE <=0.25 SENSITIVE Sensitive     CIPROFLOXACIN >=4 RESISTANT Resistant     GENTAMICIN <=1 SENSITIVE Sensitive     IMIPENEM 0.5 SENSITIVE Sensitive     TRIMETH/SULFA <=20 SENSITIVE Sensitive     AMPICILLIN/SULBACTAM 16 INTERMEDIATE Intermediate     PIP/TAZO 64 INTERMEDIATE Intermediate ug/mL    * 40,000 COLONIES/mL ESCHERICHIA COLI     Time coordinating discharge: Over 30 minutes  SIGNED:   Huey Bienenstock, MD  [facility] 07/01/2023, 2:09 PM Pager   If 7PM-7AM, please contact night-coverage [email or web address]

## 2023-07-01 NOTE — Progress Notes (Signed)
   07/01/23 1400  Spiritual Encounters  Type of Visit Initial  Care provided to: Pt and family  Conversation partners present during encounter Other (comment)  Referral source Nurse (RN/NT/LPN)  Reason for visit Advance directives  OnCall Visit No  Spiritual Framework  Presenting Themes Courage hope and growth  Values/beliefs family  Interventions  Spiritual Care Interventions Made Established relationship of care and support;Compassionate presence;Encouragement  Intervention Outcomes  Outcomes Awareness of support   Chaplain responded to request for emotional and spiritual support. [name] was present at bedside. Patient was in good mood. Chaplain provide hospitality and encourage patient to keep fighting and build relationship of care and concern. Chaplain will follow-up with patient.

## 2023-07-01 NOTE — Plan of Care (Signed)

## 2023-07-01 NOTE — Progress Notes (Signed)
Physical Therapy Wound Treatment Patient Details  Name: Luke Velasquez MRN: [id number] Date of Birth: March 24, 2001  Today's Date: 07/01/2023 Time: 8469-6295 Time Calculation (min): 67 min  Subjective  Subjective Assessment Subjective: Pt reporting his mother needs to be educated on changing he bandages. Patient and Family Stated Goals: to heal wound Date of Onset:  (unknown) Prior Treatments: dressing changes, OP wound care per chart  Pain Score:  did not indicate any pain  Wound Assessment  Pressure Injury 06/24/23 Hip Left Stage 4 - Full thickness tissue loss with exposed bone, tendon or muscle. (Active)  Wound Image   07/01/23 1700  Dressing Type Foam - Lift dressing to assess site every shift;Gauze (Comment);Barrier Film (skin prep);Other (Comment) 07/01/23 1700  Dressing Clean, Dry, Intact 07/01/23 1700  Dressing Change Frequency Every 3 days 07/01/23 1700  State of Healing Eschar 07/01/23 1700  Site / Wound Assessment Brown;Pink;Yellow;Red 07/01/23 1700  % Wound base Red or Granulating 35% 07/01/23 1700  % Wound base Yellow/Fibrinous Exudate 65% 07/01/23 1700  % Wound base Black/Eschar 0% 07/01/23 1700  % Wound base Other/Granulation Tissue (Comment) 0% 07/01/23 1700  Peri-wound Assessment Intact 07/01/23 1700  Wound Length (cm) 9.2 cm 07/01/23 1700  Wound Width (cm) 10.7 cm 07/01/23 1700  Wound Depth (cm) 0.5 cm 07/01/23 1700  Wound Surface Area (cm^2) 98.44 cm^2 07/01/23 1700  Wound Volume (cm^3) 49.22 cm^3 07/01/23 1700  Tunneling (cm) 0 07/01/23 1700  Undermining (cm) 0 07/01/23 1700  Margins Unattached edges (unapproximated) 07/01/23 1700  Drainage Amount Minimal 07/01/23 1700  Drainage Description Serosanguineous 07/01/23 1700  Treatment Debridement (Selective);Cleansed;Irrigation;Packing (Dry gauze);Other (Comment) 07/01/23 1700   Selective Debridement (non-excisional) Selective Debridement (non-excisional) - Location: L hip Selective Debridement (non-excisional)  - Tools Used: Forceps, Scalpel Selective Debridement (non-excisional) - Tissue Removed: Yellow eschar/slough    Wound Assessment and Plan  Wound Therapy - Assess/Plan/Recommendations Wound Therapy - Clinical Statement: Pt is continuing to demonstrate progress towards healing his wound with noted less necrotic tissue. Adipose tissue is now becoming exposed. Educated pt's mother on how to properly change the dressing, with good demonstration by his mother to suggest adequate understanding. Provided pt and mother with a bag of supplies for dressing changes. Pt would benefit from follow-up with OP wound care, notified TOC and MD. Pt would benefit from further wound therapy while here to reduce bioburden and promote wound healing. Wound Therapy - Functional Problem List: paraplegia affecting mobility Factors Delaying/Impairing Wound Healing: Altered sensation, Infection - systemic/local, Immobility Hydrotherapy Plan: Debridement, Dressing change, Patient/family education Wound Therapy - Frequency: 2X / week Wound Therapy - Current Recommendations: Other (comment) (OP wound care) Wound Therapy - Follow Up Recommendations: dressing changes by family/patient, dressing changes by RN, Other (comment) (OP wound care)  Wound Therapy Goals- Improve the function of patient's integumentary system by progressing the wound(s) through the phases of wound healing (inflammation - proliferation - remodeling) by: Wound Therapy Goals - Improve the function of patient's integumentary system by progressing the wound(s) through the phases of wound healing by: Decrease Necrotic Tissue to: 10% Decrease Necrotic Tissue - Progress: Progressing toward goal Increase Granulation Tissue to: 90% Increase Granulation Tissue - Progress: Progressing toward goal Improve Drainage Characteristics: Min, Serous Improve Drainage Characteristics - Progress: Progressing toward goal Patient/Family will be able to : perform dressing changes  independently Patient/Family Instruction Goal - Progress: Progressing toward goal Goals/treatment plan/discharge plan were made with and agreed upon by patient/family: Yes Time For Goal Achievement: 7 days Wound Therapy - Potential for  Goals: Good  Goals will be updated until maximal potential achieved or discharge criteria met.  Discharge criteria: when goals achieved, discharge from hospital, MD decision/surgical intervention, no progress towards goals, refusal/missing three consecutive treatments without notification or medical reason.  GP     Charges PT Wound Care Charges $Wound Debridement up to 20 cm: < or equal to 20 cm $ Wound Debridement each add'l 20 sqcm: 3 $PT Hydrotherapy Visit: 1 Visit   Virgil Benedict, PT, DPT Acute Rehabilitation Services  Office: [phone]       [name] 07/01/2023, 5:51 PM

## 2023-07-01 NOTE — TOC Transition Note (Addendum)
Transition of Care [facility]) - Discharge Note   Patient Details  Name: Luke Velasquez MRN: [id number] Date of Birth: 10-19-2000  [facility] of [facility]) CM/SW Contact:  Gordy Clement, RN Phone Number: 07/01/2023, 12:36 PM   Clinical Narrative:     Patient will dc today to home with Mom. RNCM unable to secure home health. See note below . Mom is here for wound dressing changes. TOC pharmacy will provide meds  RNCM met with patient and mother at bedside  12/12 .  Patient is still refusing PICC for home ABX. RNCM was unable to secure any home health due to the fact that patient has refused care and has been non adherent. Mom and [employer]. Mom is going to be educated today by floor RN on wound care for dc. Patient refusing hospital bed with special air mattress  stating he wants his queen size bed for space to move around. RNCM shared info from resources that she can purchase air appropriate surface mattress herself for queen size bed. Patient will require PTAR transportation home. Waiting on confirmation that dressing changes and Mom's education has been completed          Patient Goals and CMS Choice            Discharge Placement                       Discharge Plan and Services Additional resources added to the After Visit Summary for                                       Social Drivers of Health (SDOH) Interventions SDOH Screenings   Food Insecurity: No Food Insecurity (06/24/2023)  Housing: Low Risk  (06/24/2023)  Transportation Needs: No Transportation Needs (06/24/2023)  Utilities: Not At Risk (06/24/2023)  Depression (PHQ2-9): Low Risk  (06/27/2020)  Recent Concern: Depression (PHQ2-9) - Medium Risk (05/16/2020)  Tobacco Use: High Risk (06/24/2023)     Readmission Risk Interventions     No data to display

## 2023-07-21 ENCOUNTER — Ambulatory Visit: Payer: 59 | Admitting: Internal Medicine

## 2023-07-21 ENCOUNTER — Other Ambulatory Visit: Payer: Self-pay

## 2023-07-21 ENCOUNTER — Emergency Department (HOSPITAL_COMMUNITY): Payer: 59

## 2023-07-21 ENCOUNTER — Emergency Department (HOSPITAL_COMMUNITY)
Admission: EM | Admit: 2023-07-21 | Discharge: 2023-07-21 | Disposition: A | Discharge status: Home / Self Care | Payer: 59 | Attending: Emergency Medicine | Admitting: Emergency Medicine

## 2023-07-21 VITALS — BP 94/53 | HR 147

## 2023-07-21 DIAGNOSIS — A419 Sepsis, unspecified organism: Secondary | ICD-10-CM | POA: Insufficient documentation

## 2023-07-21 DIAGNOSIS — R6510 Systemic inflammatory response syndrome (SIRS) of non-infectious origin without acute organ dysfunction: Secondary | ICD-10-CM | POA: Diagnosis not present

## 2023-07-21 DIAGNOSIS — F17210 Nicotine dependence, cigarettes, uncomplicated: Secondary | ICD-10-CM | POA: Insufficient documentation

## 2023-07-21 DIAGNOSIS — R002 Palpitations: Secondary | ICD-10-CM | POA: Diagnosis present

## 2023-07-21 DIAGNOSIS — E43 Unspecified severe protein-calorie malnutrition: Secondary | ICD-10-CM | POA: Diagnosis not present

## 2023-07-21 DIAGNOSIS — Z5329 Procedure and treatment not carried out because of patient's decision for other reasons: Secondary | ICD-10-CM | POA: Diagnosis not present

## 2023-07-21 DIAGNOSIS — L89150 Pressure ulcer of sacral region, unstageable: Secondary | ICD-10-CM | POA: Diagnosis not present

## 2023-07-21 DIAGNOSIS — M86659 Other chronic osteomyelitis, unspecified thigh: Secondary | ICD-10-CM

## 2023-07-21 DIAGNOSIS — F321 Major depressive disorder, single episode, moderate: Secondary | ICD-10-CM

## 2023-07-21 LAB — CBC
HCT: 31 % — ABNORMAL LOW (ref 39.0–52.0)
Hemoglobin: 9 g/dL — ABNORMAL LOW (ref 13.0–17.0)
MCH: 23.8 pg — ABNORMAL LOW (ref 26.0–34.0)
MCHC: 29 g/dL — ABNORMAL LOW (ref 30.0–36.0)
MCV: 82 fL (ref 80.0–100.0)
Platelets: 859 10*3/uL — ABNORMAL HIGH (ref 150–400)
RBC: 3.78 MIL/uL — ABNORMAL LOW (ref 4.22–5.81)
RDW: 21.9 % — ABNORMAL HIGH (ref 11.5–15.5)
WBC: 22.2 10*3/uL — ABNORMAL HIGH (ref 4.0–10.5)
nRBC: 0 % (ref 0.0–0.2)

## 2023-07-21 LAB — BASIC METABOLIC PANEL
Anion gap: 14 (ref 5–15)
BUN: 13 mg/dL (ref 6–20)
CO2: 17 mmol/L — ABNORMAL LOW (ref 22–32)
Calcium: 8.5 mg/dL — ABNORMAL LOW (ref 8.9–10.3)
Chloride: 103 mmol/L (ref 98–111)
Creatinine, Ser: 0.7 mg/dL (ref 0.61–1.24)
GFR, Estimated: >60 mL/min (ref >60)
Glucose, Bld: 78 mg/dL (ref 70–99)
Potassium: 4 mmol/L (ref 3.5–5.1)
Sodium: 134 mmol/L — ABNORMAL LOW (ref 135–145)

## 2023-07-21 MED ORDER — VANCOMYCIN HCL IN DEXTROSE 1-5 GM/200ML-% IV SOLN
1000.0000 mg | Freq: Once | INTRAVENOUS | Status: DC
  Filled 2023-07-21: qty 200

## 2023-07-21 MED ORDER — CEFEPIME HCL 2 G IV SOLR
2.0000 g | Freq: Once | INTRAVENOUS | Status: DC
  Filled 2023-07-21: qty 12.5

## 2023-07-21 MED ORDER — SODIUM CHLORIDE 0.9 % IV BOLUS
30.0000 mL/kg | Freq: Once | INTRAVENOUS | Status: DC
Start: 2023-07-21 — End: 2023-07-21

## 2023-07-21 MED ORDER — METRONIDAZOLE 500 MG/100ML IV SOLN
500.0000 mg | Freq: Once | INTRAVENOUS | Status: DC
  Filled 2023-07-21: qty 100

## 2023-07-21 MED ORDER — ONDANSETRON 4 MG PO TBDP: 4.0000 mg | ORAL_TABLET | Freq: Three times a day (TID) | ORAL | 3 refills | Status: DC | PRN

## 2023-07-21 MED ORDER — LACTATED RINGERS IV SOLN: INTRAVENOUS | Status: DC

## 2023-07-21 NOTE — ED Notes (Signed)
 Pt repeatedly refusing treatment. Pt stating, I am denying treatment right now. What do you not understand? You are saying I need stronger antibiotics, I am telling you I don't need it. I don't want it. You've said the same thing 800 times and I'm still telling you I don't want anything. MD at bedside.

## 2023-07-21 NOTE — ED Notes (Signed)
 Patient refusing IV start at this time. This RN explained to patient and patient family the importance of timely intervention. Pt still refusing at this time. MD notified.

## 2023-07-21 NOTE — Discharge Instructions (Addendum)
 We evaluated you in the hospital for your fast heart rate.  We believe that you are very ill with an infection.  The antibiotics you are taking at home are not helping.  We think you need to stay in the hospital.  Please continue your oral antibiotics at home since you are not willing to be admitted for IV antibiotics.  You decided that you wanted to refuse all treatment and go home.  I strongly feel that you need to stay in the hospital.  I think you are definitely going to get worse and you may die soon.  If you ever change your mind, please return immediately to the hospital.

## 2023-07-21 NOTE — ED Notes (Signed)
 Pt refused to sign AMA form, stating "Just put a refusal on there." Pt refusing any vital signs prior to discharge.

## 2023-07-21 NOTE — ED Notes (Signed)
 Pt left against medical advice. This RN explained AVS paperwork to both the patient and patient mother, who both verbalized understanding. Pt alert and orientedx4.

## 2023-07-21 NOTE — Progress Notes (Signed)
 Patient ID: Luke Velasquez, male   DOB: [birth year], 23 y.o.   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 He was hospitalized from dec 5-13th. He declined taking IV abtx and thus transitioned to doxycycline , cipro  and metronidazole . He is now here for appt and he is here with his mother. He is having Nausea/vomiting with abtx Poor po intake, and increasing rigors no fevers Outpatient Encounter Medications as of 07/21/2023  Medication Sig   ciprofloxacin  (CIPRO ) 750 MG tablet Take 1 tablet (750 mg total) by mouth 2 (two) times daily.   doxycycline  (VIBRA -TABS) 100 MG tablet Take 1 tablet (100 mg total) by mouth 2 (two) times daily after a meal.   metroNIDAZOLE  (FLAGYL ) 500 MG tablet Take 1 tablet (500 mg total) by mouth 2 (two) times daily.   traZODone  (DESYREL ) 50 MG tablet Take 1 tablet (50 mg total) by mouth at bedtime.   No facility-administered encounter medications on file as of 07/21/2023.     Patient Active Problem List   Diagnosis Date Noted   Insomnia due to medical condition 06/25/2023   Sepsis secondary to UTI (HCC) 06/24/2023   Sacral decubitus ulcer 06/24/2023   Chronic osteomyelitis of pelvic region (HCC) 06/24/2023   Acute blood loss anemia 06/24/2023   History of DVT (deep vein thrombosis) 06/24/2023   Thrombocytosis 06/24/2023   Involuntary commitment 06/24/2023   Pressure injury of skin 02/14/2023   Unspecified protein-calorie malnutrition (HCC) 02/14/2023   UTI (urinary tract infection) 02/14/2023   Drainage from wound 02/12/2023   PTSD (post-traumatic stress disorder) 12/07/2022   Psychosis (HCC) 08/06/2022   Polysubstance abuse (HCC) 08/06/2022   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 12 point ros is negative except what is mentioned above Physical Exam   BP (!) 94/53   Pulse (!) 147   SpO2 98%  Physical Exam  Constitutional: He is oriented to person, place, and time. He appears well-developed and well-nourished. Mild distress HENT:  Mouth/Throat: Oropharynx is clear and moist. No oropharyngeal exudate.  Cardiovascular:tachycardic, regular rhythm and normal heart sounds. Exam reveals no gallop and no friction rub.  No murmur heard.  Pulmonary/Chest: Effort normal and breath sounds normal. No respiratory distress. He has no wheezes.  Abdominal: Soft. Bowel sounds are normal. He exhibits no distension. There is no tenderness.  Lymphadenopathy:  He has no cervical adenopathy.  Neurological: He is alert and oriented to person, place, and time.  Skin: Skin is warm and dry. No rash noted. No erythema.  Psychiatric: He has a normal mood and affect. His behavior is normal.    CBC Lab Results  Component Value Date   WBC 8.9 06/30/2023   RBC 3.50 (L) 06/30/2023   HGB 8.3 (L) 06/30/2023   HCT 28.3 (L) 06/30/2023   PLT 651 (H) 06/30/2023   MCV 80.9 06/30/2023   MCH 23.7 (L) 06/30/2023   MCHC 29.3 (L) 06/30/2023   RDW 22.5 (H) 06/30/2023  LYMPHSABS 2.2 06/30/2023   MONOABS 0.7 06/30/2023   EOSABS 0.1 06/30/2023    BMET Lab Results  Component Value Date   NA 139 06/30/2023   K 4.1 06/30/2023   CL 104 06/30/2023   CO2 29 06/30/2023   GLUCOSE 97 06/30/2023   BUN 12 06/30/2023   CREATININE 0.58 (L) 06/30/2023   CALCIUM 8.5 (L) 06/30/2023   GFRNONAA >60 06/30/2023   GFRAA 155 06/27/2020      Assessment and Plan  SIRS - like picture with BP 90/50s, tachycardia, rigors. Dehydrated on exam, unable to draw labs in clinic. Recommend to  evaluate at ED for underlying infection, urinary source vs. Worsening pelvic osteo. Have discussed with mother that they need to go to ED for evaluation.  Severe protein calorie malnutrition = poor po intake in the past few weeks. Emphasized importance of  Increase nutritional intake  See urology for urinary incontinence /neurogenic bladder Check labs for osteo. Continue on abtx Sees wound care on 1/13 See back in 4 wk  Chronic osteomyelitis of pelvis =Offered dalbavancin q14 d but patient deferred Will give anti emetic to use prior to abtx  Patient declined remeron  as use for appetite stimulant/ Concern for depression for why he is not motivated to improvement

## 2023-07-21 NOTE — ED Notes (Signed)
 MD at bedside.
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
## 2023-07-21 NOTE — Sepsis Progress Note (Signed)
 Elink monitoring for the code sepsis protocol.

## 2023-07-21 NOTE — Sepsis Progress Note (Signed)
 Notified bedside nurse of need to draw lactic acid and blood cultures. So antibiotics can be given

## 2023-07-21 NOTE — ED Provider Triage Note (Cosign Needed)
 Emergency Medicine Provider Triage Evaluation Note  Luke Velasquez , a 23 y.o. male  was evaluated in triage.  Pt complains of elevated heart rate.  Referred here from PCP.  No chest pain or shortness of breath.  Review of Systems  Positive:  Negative:   Physical Exam  BP (!) 122/109   Pulse (!) 147   Temp 97.6 F (36.4 C) (Oral)   Resp 18   Wt 68 kg   SpO2 100%   BMI 18.25 kg/m  Gen:   Awake, no distress   Resp:  Normal effort  MSK:   Moves extremities without difficulty  Other:    Medical Decision Making  Medically screening exam initiated at 3:43 PM.  Appropriate orders placed.  Luke Velasquez was informed that the remainder of the evaluation will be completed by another provider, this initial triage assessment does not replace that evaluation, and the importance of remaining in the ED until their evaluation is complete.     Donnajean Lynwood DEL, PA-C 07/21/23 1544

## 2023-07-21 NOTE — ED Triage Notes (Signed)
 PT states dr sent me here because my heart rate is high. Denies any chest pain.

## 2023-07-21 NOTE — ED Notes (Signed)
 MD explaining to patient and patient mother risks of refusing treatment and/or leaving AMA. Pt repeatedly still refusing, stating "I am allowed to deny treatment. I am telling you I do not want no IV or antibiotics."

## 2023-08-02 ENCOUNTER — Ambulatory Visit (HOSPITAL_BASED_OUTPATIENT_CLINIC_OR_DEPARTMENT_OTHER): Payer: 59 | Admitting: Internal Medicine

## 2023-09-22 ENCOUNTER — Encounter (HOSPITAL_BASED_OUTPATIENT_CLINIC_OR_DEPARTMENT_OTHER): Payer: 59 | Admitting: General Surgery

## 2023-11-20 ENCOUNTER — Encounter (HOSPITAL_COMMUNITY): Payer: Self-pay

## 2023-11-20 ENCOUNTER — Inpatient Hospital Stay (HOSPITAL_COMMUNITY)
Admission: EM | Admit: 2023-11-20 | Discharge: 2023-11-23 | DRG: 871 | Disposition: A | Discharge status: Hospice | Attending: Family Medicine | Admitting: Family Medicine

## 2023-11-20 ENCOUNTER — Other Ambulatory Visit: Payer: Self-pay

## 2023-11-20 ENCOUNTER — Emergency Department (HOSPITAL_COMMUNITY)

## 2023-11-20 DIAGNOSIS — Z905 Acquired absence of kidney: Secondary | ICD-10-CM

## 2023-11-20 DIAGNOSIS — L89154 Pressure ulcer of sacral region, stage 4: Secondary | ICD-10-CM | POA: Diagnosis present

## 2023-11-20 DIAGNOSIS — L89612 Pressure ulcer of right heel, stage 2: Secondary | ICD-10-CM | POA: Diagnosis present

## 2023-11-20 DIAGNOSIS — L89892 Pressure ulcer of other site, stage 2: Secondary | ICD-10-CM | POA: Diagnosis present

## 2023-11-20 DIAGNOSIS — F22 Delusional disorders: Secondary | ICD-10-CM | POA: Diagnosis present

## 2023-11-20 DIAGNOSIS — A419 Sepsis, unspecified organism: Principal | ICD-10-CM | POA: Diagnosis present

## 2023-11-20 DIAGNOSIS — N319 Neuromuscular dysfunction of bladder, unspecified: Secondary | ICD-10-CM | POA: Diagnosis present

## 2023-11-20 DIAGNOSIS — Z993 Dependence on wheelchair: Secondary | ICD-10-CM

## 2023-11-20 DIAGNOSIS — F4310 Post-traumatic stress disorder, unspecified: Secondary | ICD-10-CM | POA: Diagnosis present

## 2023-11-20 DIAGNOSIS — Z933 Colostomy status: Secondary | ICD-10-CM

## 2023-11-20 DIAGNOSIS — F17210 Nicotine dependence, cigarettes, uncomplicated: Secondary | ICD-10-CM | POA: Diagnosis present

## 2023-11-20 DIAGNOSIS — Z66 Do not resuscitate: Secondary | ICD-10-CM | POA: Diagnosis present

## 2023-11-20 DIAGNOSIS — L89522 Pressure ulcer of left ankle, stage 2: Secondary | ICD-10-CM | POA: Diagnosis present

## 2023-11-20 DIAGNOSIS — R64 Cachexia: Secondary | ICD-10-CM | POA: Diagnosis present

## 2023-11-20 DIAGNOSIS — F319 Bipolar disorder, unspecified: Secondary | ICD-10-CM | POA: Diagnosis present

## 2023-11-20 DIAGNOSIS — D649 Anemia, unspecified: Secondary | ICD-10-CM | POA: Diagnosis present

## 2023-11-20 DIAGNOSIS — I444 Left anterior fascicular block: Secondary | ICD-10-CM | POA: Diagnosis present

## 2023-11-20 DIAGNOSIS — Z515 Encounter for palliative care: Secondary | ICD-10-CM

## 2023-11-20 DIAGNOSIS — E872 Acidosis, unspecified: Secondary | ICD-10-CM | POA: Diagnosis present

## 2023-11-20 DIAGNOSIS — Z7401 Bed confinement status: Secondary | ICD-10-CM

## 2023-11-20 DIAGNOSIS — M8619 Other acute osteomyelitis, multiple sites: Secondary | ICD-10-CM | POA: Diagnosis present

## 2023-11-20 DIAGNOSIS — Z681 Body mass index (BMI) 19 or less, adult: Secondary | ICD-10-CM

## 2023-11-20 DIAGNOSIS — R252 Cramp and spasm: Secondary | ICD-10-CM | POA: Diagnosis present

## 2023-11-20 DIAGNOSIS — Q059 Spina bifida, unspecified: Secondary | ICD-10-CM

## 2023-11-20 DIAGNOSIS — M86659 Other chronic osteomyelitis, unspecified thigh: Secondary | ICD-10-CM | POA: Diagnosis present

## 2023-11-20 DIAGNOSIS — L89222 Pressure ulcer of left hip, stage 2: Secondary | ICD-10-CM | POA: Diagnosis present

## 2023-11-20 DIAGNOSIS — L89159 Pressure ulcer of sacral region, unspecified stage: Secondary | ICD-10-CM | POA: Diagnosis present

## 2023-11-20 DIAGNOSIS — L89214 Pressure ulcer of right hip, stage 4: Secondary | ICD-10-CM | POA: Diagnosis present

## 2023-11-20 DIAGNOSIS — E43 Unspecified severe protein-calorie malnutrition: Secondary | ICD-10-CM | POA: Diagnosis present

## 2023-11-20 DIAGNOSIS — L89626 Pressure-induced deep tissue damage of left heel: Secondary | ICD-10-CM | POA: Diagnosis present

## 2023-11-20 DIAGNOSIS — D638 Anemia in other chronic diseases classified elsewhere: Secondary | ICD-10-CM | POA: Diagnosis present

## 2023-11-20 DIAGNOSIS — D75839 Thrombocytosis, unspecified: Secondary | ICD-10-CM | POA: Diagnosis present

## 2023-11-20 DIAGNOSIS — E440 Moderate protein-calorie malnutrition: Secondary | ICD-10-CM | POA: Insufficient documentation

## 2023-11-20 DIAGNOSIS — R5381 Other malaise: Secondary | ICD-10-CM | POA: Diagnosis present

## 2023-11-20 DIAGNOSIS — D75838 Other thrombocytosis: Secondary | ICD-10-CM | POA: Diagnosis present

## 2023-11-20 DIAGNOSIS — Z91199 Patient's noncompliance with other medical treatment and regimen due to unspecified reason: Secondary | ICD-10-CM

## 2023-11-20 DIAGNOSIS — Z86718 Personal history of other venous thrombosis and embolism: Secondary | ICD-10-CM

## 2023-11-20 DIAGNOSIS — Z046 Encounter for general psychiatric examination, requested by authority: Secondary | ICD-10-CM

## 2023-11-20 DIAGNOSIS — G8220 Paraplegia, unspecified: Secondary | ICD-10-CM | POA: Diagnosis present

## 2023-11-20 NOTE — ED Provider Notes (Signed)
 [place] EMERGENCY DEPARTMENT AT [facility] Provider Note   CSN: [id number] Arrival date & time: 11/20/23  2151     History {Add pertinent medical, surgical, social history, OB history to HPI:1} No chief complaint on file.   Luke Velasquez is a 23 y.o. male.  Patient presents to the emergency department stating that his bedsores are hurting more than usual.       Home Medications Prior to Admission medications   Medication Sig Start Date End Date Taking? Authorizing Provider  ondansetron  (ZOFRAN -ODT) 4 MG disintegrating tablet Take 1 tablet (4 mg total) by mouth every 8 (eight) hours as needed for nausea or vomiting. 07/21/23   Liane Redman, MD  traZODone  (DESYREL ) 50 MG tablet Take 1 tablet (50 mg total) by mouth at bedtime. 07/01/23   Elgergawy, Ardia Kraft, MD      Allergies    Ivp dye [iodinated contrast media]    Review of Systems   Review of Systems  Physical Exam Updated Vital Signs BP 107/66   Pulse (!) 129   Temp 99.9 F (37.7 C) (Oral)   Resp 18   SpO2 98%  Physical Exam Vitals and nursing note reviewed.  Constitutional:      General: He is not in acute distress.    Appearance: He is well-developed.  HENT:     Head: Normocephalic and atraumatic.     Mouth/Throat:     Mouth: Mucous membranes are moist.  Eyes:     General: Vision grossly intact. Gaze aligned appropriately.     Extraocular Movements: Extraocular movements intact.     Conjunctiva/sclera: Conjunctivae normal.  Cardiovascular:     Rate and Rhythm: Regular rhythm. Tachycardia present.     Pulses: Normal pulses.     Heart sounds: Normal heart sounds, S1 normal and S2 normal. No murmur heard.    No friction rub. No gallop.  Pulmonary:     Effort: Pulmonary effort is normal. No respiratory distress.     Breath sounds: Normal breath sounds.  Abdominal:     Palpations: Abdomen is soft.     Tenderness: There is no abdominal tenderness. There is no guarding or rebound.      Hernia: No hernia is present.  Musculoskeletal:        General: No swelling.     Cervical back: Full passive range of motion without pain, normal range of motion and neck supple. No pain with movement, spinous process tenderness or muscular tenderness. Normal range of motion.     Right lower leg: No edema.     Left lower leg: No edema.  Skin:    General: Skin is warm and dry.     Capillary Refill: Capillary refill takes less than 2 seconds.     Findings: No ecchymosis, erythema, lesion or wound.  Neurological:     Mental Status: He is alert and oriented to person, place, and time.     GCS: GCS eye subscore is 4. GCS verbal subscore is 5. GCS motor subscore is 6.     Cranial Nerves: Cranial nerves 2-12 are intact.     Sensory: Sensation is intact.     Motor: Motor function is intact. No weakness or abnormal muscle tone.     Coordination: Coordination is intact.  Psychiatric:        Mood and Affect: Mood normal.        Speech: Speech normal.        Behavior: Behavior normal.  ED Results / Procedures / Treatments   Labs (all labs ordered are listed, but only abnormal results are displayed) Labs Reviewed  CULTURE, BLOOD (ROUTINE X 2)  CULTURE, BLOOD (ROUTINE X 2)  COMPREHENSIVE METABOLIC PANEL WITH GFR  CBC WITH DIFFERENTIAL/PLATELET  PROTIME-INR  URINALYSIS, W/ REFLEX TO CULTURE (INFECTION SUSPECTED)  I-STAT CG4 LACTIC ACID, ED    EKG None  Radiology No results found.  Procedures Procedures  {Document cardiac monitor, telemetry assessment procedure when appropriate:1}  Medications Ordered in ED Medications - No data to display  ED Course/ Medical Decision Making/ A&P   {   Click here for ABCD2, HEART and other calculatorsREFRESH Note before signing :1}                              Medical Decision Making Amount and/or Complexity of Data Reviewed Labs: ordered. Radiology: ordered.   ***  {Document critical care time when appropriate:1} {Document review  of labs and clinical decision tools ie heart score, Chads2Vasc2 etc:1}  {Document your independent review of radiology images, and any outside records:1} {Document your discussion with family members, caretakers, and with consultants:1} {Document social determinants of health affecting pt's care:1} {Document your decision making why or why not admission, treatments were needed:1} Final Clinical Impression(s) / ED Diagnoses Final diagnoses:  None    Rx / DC Orders ED Discharge Orders     None

## 2023-11-20 NOTE — ED Triage Notes (Signed)
 BIBA from home for pain related to multiple bedsores on bilateral legs, sacrum. Pt states he is only here for pain, and does not need antibiotics or an IV.

## 2023-11-21 ENCOUNTER — Encounter (HOSPITAL_COMMUNITY): Payer: Self-pay | Admitting: Internal Medicine

## 2023-11-21 ENCOUNTER — Inpatient Hospital Stay (HOSPITAL_COMMUNITY)

## 2023-11-21 DIAGNOSIS — E872 Acidosis, unspecified: Secondary | ICD-10-CM | POA: Diagnosis present

## 2023-11-21 DIAGNOSIS — F319 Bipolar disorder, unspecified: Secondary | ICD-10-CM | POA: Diagnosis present

## 2023-11-21 DIAGNOSIS — L89214 Pressure ulcer of right hip, stage 4: Secondary | ICD-10-CM | POA: Diagnosis present

## 2023-11-21 DIAGNOSIS — M8619 Other acute osteomyelitis, multiple sites: Secondary | ICD-10-CM | POA: Diagnosis present

## 2023-11-21 DIAGNOSIS — Z681 Body mass index (BMI) 19 or less, adult: Secondary | ICD-10-CM | POA: Diagnosis not present

## 2023-11-21 DIAGNOSIS — Z7409 Other reduced mobility: Secondary | ICD-10-CM | POA: Insufficient documentation

## 2023-11-21 DIAGNOSIS — N319 Neuromuscular dysfunction of bladder, unspecified: Secondary | ICD-10-CM | POA: Diagnosis present

## 2023-11-21 DIAGNOSIS — D638 Anemia in other chronic diseases classified elsewhere: Secondary | ICD-10-CM | POA: Diagnosis present

## 2023-11-21 DIAGNOSIS — L89892 Pressure ulcer of other site, stage 2: Secondary | ICD-10-CM | POA: Diagnosis present

## 2023-11-21 DIAGNOSIS — L89154 Pressure ulcer of sacral region, stage 4: Secondary | ICD-10-CM

## 2023-11-21 DIAGNOSIS — L0390 Cellulitis, unspecified: Secondary | ICD-10-CM

## 2023-11-21 DIAGNOSIS — R64 Cachexia: Secondary | ICD-10-CM | POA: Diagnosis present

## 2023-11-21 DIAGNOSIS — E43 Unspecified severe protein-calorie malnutrition: Secondary | ICD-10-CM

## 2023-11-21 DIAGNOSIS — D649 Anemia, unspecified: Secondary | ICD-10-CM | POA: Diagnosis present

## 2023-11-21 DIAGNOSIS — A419 Sepsis, unspecified organism: Secondary | ICD-10-CM | POA: Diagnosis present

## 2023-11-21 DIAGNOSIS — Q059 Spina bifida, unspecified: Secondary | ICD-10-CM | POA: Diagnosis not present

## 2023-11-21 DIAGNOSIS — Z933 Colostomy status: Secondary | ICD-10-CM | POA: Diagnosis not present

## 2023-11-21 DIAGNOSIS — L89222 Pressure ulcer of left hip, stage 2: Secondary | ICD-10-CM | POA: Diagnosis present

## 2023-11-21 DIAGNOSIS — F17210 Nicotine dependence, cigarettes, uncomplicated: Secondary | ICD-10-CM | POA: Diagnosis present

## 2023-11-21 DIAGNOSIS — Z66 Do not resuscitate: Secondary | ICD-10-CM | POA: Diagnosis present

## 2023-11-21 DIAGNOSIS — F22 Delusional disorders: Secondary | ICD-10-CM | POA: Diagnosis present

## 2023-11-21 DIAGNOSIS — Z86718 Personal history of other venous thrombosis and embolism: Secondary | ICD-10-CM | POA: Diagnosis not present

## 2023-11-21 DIAGNOSIS — L89522 Pressure ulcer of left ankle, stage 2: Secondary | ICD-10-CM | POA: Diagnosis present

## 2023-11-21 DIAGNOSIS — L89159 Pressure ulcer of sacral region, unspecified stage: Secondary | ICD-10-CM | POA: Diagnosis present

## 2023-11-21 DIAGNOSIS — L89612 Pressure ulcer of right heel, stage 2: Secondary | ICD-10-CM | POA: Diagnosis present

## 2023-11-21 DIAGNOSIS — Z7189 Other specified counseling: Secondary | ICD-10-CM

## 2023-11-21 DIAGNOSIS — M8639 Chronic multifocal osteomyelitis, multiple sites: Secondary | ICD-10-CM | POA: Diagnosis not present

## 2023-11-21 DIAGNOSIS — Z515 Encounter for palliative care: Secondary | ICD-10-CM | POA: Diagnosis not present

## 2023-11-21 DIAGNOSIS — G8220 Paraplegia, unspecified: Secondary | ICD-10-CM

## 2023-11-21 LAB — I-STAT CG4 LACTIC ACID, ED: Lactic Acid, Venous: 5.4 mmol/L (ref 0.5–1.9)

## 2023-11-21 LAB — LACTIC ACID, PLASMA: Lactic Acid, Venous: 1.5 mmol/L (ref 0.5–1.9)

## 2023-11-21 LAB — URINALYSIS, ROUTINE W REFLEX MICROSCOPIC
Bilirubin Urine: NEGATIVE
Glucose, UA: NEGATIVE mg/dL
Hgb urine dipstick: NEGATIVE
Ketones, ur: NEGATIVE mg/dL
Nitrite: POSITIVE — AB
Protein, ur: NEGATIVE mg/dL
Specific Gravity, Urine: 1.018 (ref 1.005–1.030)
pH: 5 (ref 5.0–8.0)

## 2023-11-21 LAB — URINALYSIS, W/ REFLEX TO CULTURE (INFECTION SUSPECTED)
Bilirubin Urine: NEGATIVE
Glucose, UA: NEGATIVE mg/dL
Ketones, ur: NEGATIVE mg/dL
Nitrite: POSITIVE — AB
Protein, ur: 30 mg/dL — AB
Specific Gravity, Urine: 1.019 (ref 1.005–1.030)
WBC, UA: >50 WBC/hpf (ref 0–5)
pH: 5 (ref 5.0–8.0)

## 2023-11-21 LAB — COMPREHENSIVE METABOLIC PANEL WITH GFR
ALT: 21 U/L (ref 0–44)
AST: 23 U/L (ref 15–41)
Albumin: 1.7 g/dL — ABNORMAL LOW (ref 3.5–5.0)
Alkaline Phosphatase: 238 U/L — ABNORMAL HIGH (ref 38–126)
Anion gap: 11 (ref 5–15)
BUN: 10 mg/dL (ref 6–20)
CO2: 21 mmol/L — ABNORMAL LOW (ref 22–32)
Calcium: 7.7 mg/dL — ABNORMAL LOW (ref 8.9–10.3)
Chloride: 104 mmol/L (ref 98–111)
Creatinine, Ser: 0.64 mg/dL (ref 0.61–1.24)
GFR, Estimated: >60 mL/min (ref >60)
Glucose, Bld: 110 mg/dL — ABNORMAL HIGH (ref 70–99)
Potassium: 3.9 mmol/L (ref 3.5–5.1)
Sodium: 136 mmol/L (ref 135–145)
Total Bilirubin: 0.4 mg/dL (ref 0.0–1.2)
Total Protein: 6.6 g/dL (ref 6.5–8.1)

## 2023-11-21 LAB — CBC WITH DIFFERENTIAL/PLATELET
Abs Immature Granulocytes: 0.06 10*3/uL (ref 0.00–0.07)
Basophils Absolute: 0 10*3/uL (ref 0.0–0.1)
Basophils Relative: 0 %
Eosinophils Absolute: 0.2 10*3/uL (ref 0.0–0.5)
Eosinophils Relative: 2 %
HCT: 21.6 % — ABNORMAL LOW (ref 39.0–52.0)
Hemoglobin: 5.9 g/dL — CL (ref 13.0–17.0)
Immature Granulocytes: 1 %
Lymphocytes Relative: 26 %
Lymphs Abs: 2.8 10*3/uL (ref 0.7–4.0)
MCH: 22.3 pg — ABNORMAL LOW (ref 26.0–34.0)
MCHC: 27.3 g/dL — ABNORMAL LOW (ref 30.0–36.0)
MCV: 81.5 fL (ref 80.0–100.0)
Monocytes Absolute: 0.8 10*3/uL (ref 0.1–1.0)
Monocytes Relative: 8 %
Neutro Abs: 7.1 10*3/uL (ref 1.7–7.7)
Neutrophils Relative %: 63 %
Platelets: 631 10*3/uL — ABNORMAL HIGH (ref 150–400)
RBC: 2.65 MIL/uL — ABNORMAL LOW (ref 4.22–5.81)
RDW: 20.8 % — ABNORMAL HIGH (ref 11.5–15.5)
WBC: 11 10*3/uL — ABNORMAL HIGH (ref 4.0–10.5)
nRBC: 0 % (ref 0.0–0.2)

## 2023-11-21 LAB — RAPID URINE DRUG SCREEN, HOSP PERFORMED
Amphetamines: NOT DETECTED
Barbiturates: NOT DETECTED
Benzodiazepines: NOT DETECTED
Cocaine: NOT DETECTED
Opiates: NOT DETECTED
Tetrahydrocannabinol: POSITIVE — AB

## 2023-11-21 LAB — MRSA NEXT GEN BY PCR, NASAL: MRSA by PCR Next Gen: NOT DETECTED

## 2023-11-21 LAB — PREPARE RBC (CROSSMATCH)

## 2023-11-21 LAB — PROTIME-INR
INR: 1.3 — ABNORMAL HIGH (ref 0.8–1.2)
Prothrombin Time: 16.5 s — ABNORMAL HIGH (ref 11.4–15.2)

## 2023-11-21 MED ORDER — SODIUM CHLORIDE 0.9 % IV SOLN: 2.0000 g | Freq: Three times a day (TID) | INTRAVENOUS | Status: DC

## 2023-11-21 MED ORDER — DIPHENHYDRAMINE HCL 25 MG PO CAPS
25.0000 mg | ORAL_CAPSULE | Freq: Two times a day (BID) | ORAL | Status: DC | PRN
Start: 2023-11-21 — End: 2023-11-24

## 2023-11-21 MED ORDER — CHLORHEXIDINE GLUCONATE CLOTH 2 % EX PADS
6.0000 | MEDICATED_PAD | Freq: Every day | CUTANEOUS | Status: DC
  Administered 2023-11-21: 6 via TOPICAL

## 2023-11-21 MED ORDER — LACTATED RINGERS IV BOLUS (SEPSIS)
1000.0000 mL | Freq: Once | INTRAVENOUS | Status: AC
  Administered 2023-11-21: 1000 mL via INTRAVENOUS

## 2023-11-21 MED ORDER — HALOPERIDOL LACTATE 2 MG/ML PO CONC: 0.5000 mg | ORAL | Status: DC | PRN

## 2023-11-21 MED ORDER — ORAL CARE MOUTH RINSE: 15.0000 mL | OROMUCOSAL | Status: DC | PRN

## 2023-11-21 MED ORDER — ACETAMINOPHEN 650 MG RE SUPP: 650.0000 mg | Freq: Four times a day (QID) | RECTAL | Status: DC | PRN

## 2023-11-21 MED ORDER — POLYVINYL ALCOHOL 1.4 % OP SOLN: 1.0000 [drp] | Freq: Four times a day (QID) | OPHTHALMIC | Status: DC | PRN

## 2023-11-21 MED ORDER — DIPHENHYDRAMINE HCL 50 MG/ML IJ SOLN: 25.0000 mg | Freq: Two times a day (BID) | INTRAMUSCULAR | Status: DC | PRN

## 2023-11-21 MED ORDER — LORAZEPAM 2 MG/ML IJ SOLN
1.0000 mg | INTRAMUSCULAR | Status: DC | PRN
  Filled 2023-11-21: qty 1

## 2023-11-21 MED ORDER — GLYCOPYRROLATE 1 MG PO TABS: 1.0000 mg | ORAL_TABLET | ORAL | Status: DC | PRN

## 2023-11-21 MED ORDER — GLYCOPYRROLATE 0.2 MG/ML IJ SOLN: 0.2000 mg | INTRAMUSCULAR | Status: DC | PRN

## 2023-11-21 MED ORDER — MORPHINE SULFATE (CONCENTRATE) 10 MG /0.5 ML PO SOLN
5.0000 mg | ORAL | Status: DC | PRN
  Administered 2023-11-22 – 2023-11-23 (×2): 5 mg via SUBLINGUAL
  Filled 2023-11-21 (×3): qty 0.5

## 2023-11-21 MED ORDER — BIOTENE DRY MOUTH MT LIQD: 15.0000 mL | OROMUCOSAL | Status: DC | PRN

## 2023-11-21 MED ORDER — BARIUM SULFATE 2 % PO SUSP
450.0000 mL | ORAL | Status: AC
Start: 2023-11-21 — End: 2023-11-21
  Administered 2023-11-21: 450 mL via ORAL

## 2023-11-21 MED ORDER — ENSURE ENLIVE PO LIQD: 237.0000 mL | Freq: Two times a day (BID) | ORAL | Status: DC

## 2023-11-21 MED ORDER — HALOPERIDOL 5 MG PO TABS: 5.0000 mg | ORAL_TABLET | Freq: Two times a day (BID) | ORAL | Status: DC | PRN

## 2023-11-21 MED ORDER — SODIUM CHLORIDE 0.9 % IV SOLN
2.0000 g | Freq: Once | INTRAVENOUS | Status: AC
  Administered 2023-11-21: 2 g via INTRAVENOUS
  Filled 2023-11-21: qty 12.5

## 2023-11-21 MED ORDER — MORPHINE SULFATE (CONCENTRATE) 10 MG /0.5 ML PO SOLN
5.0000 mg | ORAL | Status: DC | PRN
  Administered 2023-11-22 – 2023-11-23 (×2): 5 mg via ORAL
  Filled 2023-11-21: qty 0.5

## 2023-11-21 MED ORDER — BOOST / RESOURCE BREEZE PO LIQD CUSTOM: 1.0000 | Freq: Three times a day (TID) | ORAL | Status: DC

## 2023-11-21 MED ORDER — ONDANSETRON HCL 4 MG/2ML IJ SOLN: 4.0000 mg | Freq: Four times a day (QID) | INTRAMUSCULAR | Status: DC | PRN

## 2023-11-21 MED ORDER — VITAMIN C 500 MG PO TABS: 500.0000 mg | ORAL_TABLET | Freq: Two times a day (BID) | ORAL | Status: DC

## 2023-11-21 MED ORDER — LACTATED RINGERS IV SOLN: INTRAVENOUS | Status: DC

## 2023-11-21 MED ORDER — HALOPERIDOL 0.5 MG PO TABS: 0.5000 mg | ORAL_TABLET | ORAL | Status: DC | PRN

## 2023-11-21 MED ORDER — METRONIDAZOLE 500 MG/100ML IV SOLN: 500.0000 mg | Freq: Two times a day (BID) | INTRAVENOUS | Status: DC

## 2023-11-21 MED ORDER — SODIUM CHLORIDE 0.9% IV SOLUTION: Freq: Once | INTRAVENOUS | Status: DC

## 2023-11-21 MED ORDER — VANCOMYCIN HCL IN DEXTROSE 1-5 GM/200ML-% IV SOLN: 1000.0000 mg | Freq: Two times a day (BID) | INTRAVENOUS | Status: DC

## 2023-11-21 MED ORDER — ZINC SULFATE 220 (50 ZN) MG PO CAPS: 220.0000 mg | ORAL_CAPSULE | Freq: Every day | ORAL | Status: DC

## 2023-11-21 MED ORDER — METRONIDAZOLE 500 MG/100ML IV SOLN
500.0000 mg | Freq: Once | INTRAVENOUS | Status: AC
  Administered 2023-11-21: 500 mg via INTRAVENOUS
  Filled 2023-11-21: qty 100

## 2023-11-21 MED ORDER — ADULT MULTIVITAMIN W/MINERALS CH: 1.0000 | ORAL_TABLET | Freq: Every day | ORAL | Status: DC

## 2023-11-21 MED ORDER — HALOPERIDOL LACTATE 5 MG/ML IJ SOLN: 5.0000 mg | Freq: Two times a day (BID) | INTRAMUSCULAR | Status: DC | PRN

## 2023-11-21 MED ORDER — ONDANSETRON HCL 4 MG PO TABS: 4.0000 mg | ORAL_TABLET | Freq: Four times a day (QID) | ORAL | Status: DC | PRN

## 2023-11-21 MED ORDER — VANCOMYCIN HCL IN DEXTROSE 1-5 GM/200ML-% IV SOLN
1000.0000 mg | Freq: Once | INTRAVENOUS | Status: AC
  Administered 2023-11-21: 1000 mg via INTRAVENOUS
  Filled 2023-11-21: qty 200

## 2023-11-21 MED ORDER — LACTATED RINGERS IV BOLUS (SEPSIS)
250.0000 mL | Freq: Once | INTRAVENOUS | Status: AC
  Administered 2023-11-21: 250 mL via INTRAVENOUS

## 2023-11-21 MED ORDER — ACETAMINOPHEN 325 MG PO TABS: 650.0000 mg | ORAL_TABLET | Freq: Four times a day (QID) | ORAL | Status: DC | PRN

## 2023-11-21 MED ORDER — HALOPERIDOL LACTATE 5 MG/ML IJ SOLN: 0.5000 mg | INTRAMUSCULAR | Status: DC | PRN

## 2023-11-21 NOTE — ED Notes (Signed)
 Patient continues taking his BP cuff off and pulse ox

## 2023-11-21 NOTE — ED Notes (Signed)
 IV team, Security, charge RN were all called so we could get patient an IV. Patient refused again and Charge RN explained to him that he was IVC and that we needed to start medical treatment with the IV, if he didn't cooperate we would have to hold him down and we didn't want to do that. He refused and we had to hold patient down. Security talked to pt as IV team RN inserted IV

## 2023-11-21 NOTE — ED Notes (Signed)
 Patient refused to let Nanavati MD assess his bed sores.
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
## 2023-11-21 NOTE — ED Notes (Signed)
 More labwork was order and IV line not pulling back. Pt was made aware that we would prefer to get another IV line so we could pull labs. Patient refused but stated that I could straight stick him if I needed the blood.

## 2023-11-21 NOTE — ED Notes (Signed)
 Pt's mother departed at this time

## 2023-11-21 NOTE — ED Notes (Signed)
 Patient was given [place] sandwich and soda

## 2023-11-21 NOTE — ED Notes (Signed)
 Pollina Md spoke to mother about possibly getting patient IVC due to not being able to care for himself and realizing how severe his infections are.

## 2023-11-21 NOTE — Plan of Care (Signed)
 Patient suffers from a significant mental illness (Delirium) which is likely to result in worsening infection and potentially death if untreated. There is evidence of significant and prolonged deterioration over the past year. He has been offered medication management and refused these medications due to poor insight and judgement regarding the severity of his illness.  He meets criteria for involuntary forced medication as outlined in 10A NCAC 26D .1104. Dr. Bonita Bussing has also reviewed the case and is providing the second opinion for non-emergent forced medications (see their note from today). Reason for the Medication: The patient, without the benefit of the specific treatment measure, is incapable of participating in any available treatment plan that will give the patient a realistic opportunity of improving the patient's condition.

## 2023-11-21 NOTE — ED Notes (Signed)
 Attempted asking patient if he would agree to [facility], he refused again and  states that he will be leaving AMA.

## 2023-11-21 NOTE — Consult Note (Signed)
 WOC Nurse Consult Note: Reason for Consult:Chronic nonhealing pressure injuries in the setting of severe malnutrition.  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  History bipolar disorder and is now being treated under an IVC after patient trying to leave AMA.   Wound type: chronic nonhealing pressure injuries, chronic osteomyelitis, noncompliance with antibiotic therapy.  Pressure Injury POA: Yes  Mother states Myiasis infestation noted, patient denies. None noted today.  Measurement: Sacrum:  9 cm x 12 cm x 4 cm stage 4  Left sacral area stage 3 8 cm x 6 cm x0.2 cm  Left trochanter 9 cm x 6 cm x 2 cm Left perirectal :  3 cm x 2 cm x 0.3 cm  Left lateral leg:  2 cmx 1.5 cm x 0.3 cm  Wound ZOX:WRUE pink, slick, nongranulating Drainage (amount, consistency, odor) moderate tan, foul odor Periwound: dry skin  Dressing procedure/placement/frequency: Cleanse wounds to sacrum/hip, buttocks and legs with VASHE (LAWSON # [id number])and pat dry.  Apply Aquacel (LAWSON # [id number]) to open wounds   COver with gauze and ABD pads/tape.  Change Monday/Wednesday Friday and PRN soilage.  PRevalon boots Low air loss mattress if moved to medical floor Will not follow at this time.  Please re-consult if needed.  Branda Cain MSN, RN, FNP-BC CWON Wound, Ostomy, Continence Nurse Outpatient [facility] [phone] Pager [phone]

## 2023-11-21 NOTE — Progress Notes (Addendum)
 Blood consent reviewed with patient and patient's mother at bedside. Patient does not wish to receive any blood products at this time due to possible side effects/risks listed on consent form.  Educated patient on current lab values and indications for blood transfusions, pt verbalized understanding and does not wish to proceed at this time. MD Bonita Bussing notified.   [name] at bedside when palliative provider arrived to discuss goals of care with patient and family. After palliative provider spoke with patient he requested mother and palliative provider continue to speak outside of room to discuss goals of care.   Patient calm during discussion.
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
## 2023-11-21 NOTE — ED Notes (Signed)
 Pollina MD made aware of patient's fever of 103

## 2023-11-21 NOTE — ED Notes (Signed)
 Rn made aware of pt's temp and vitals

## 2023-11-21 NOTE — ED Provider Notes (Signed)
  Physical Exam  BP 103/64 (BP Location: Right Arm)   Pulse (!) 129   Temp 98.9 F (37.2 C) (Oral)   Resp 20   SpO2 100%   Physical Exam  Procedures  .Critical Care  Performed by: Deatra Face, MD Authorized by: Deatra Face, MD   Critical care provider statement:    Critical care time (minutes):  36   Critical care was necessary to treat or prevent imminent or life-threatening deterioration of the following conditions:  Sepsis and shock (Profound anemia)   Critical care was time spent personally by me on the following activities:  Development of treatment plan with patient or surrogate, discussions with consultants, evaluation of patient's response to treatment, examination of patient, ordering and review of laboratory studies, ordering and review of radiographic studies, ordering and performing treatments and interventions, pulse oximetry, re-evaluation of patient's condition, review of old charts and obtaining history from patient or surrogate   ED Course / MDM    Medical Decision Making Amount and/or Complexity of Data Reviewed Labs: ordered. Radiology: ordered.  Risk Prescription drug management. Decision regarding hospitalization.   Assuming care of patient from DR. Pollina.  Patient has hx of paraplegia. Clinically Septic. Refusing care. Had to be IVC after counseling w/ mother. Pt appears to have refused to internal stimuli.  Now getting treatment.  MISSED CARE BUNDLE BECAUSE OF PATIENT NON COMPLIANCE WITH TREATMENT.  Fluid ordered. Antibiotics ordered.   Needs admission.  Needs palliative per mother. Pt has refused.  I have assessed the patient.  He continues to decline additional care. I have put in the appropriate IVC orders.  Patient is really involuntary committed for compliance issues, not a true safety concern.  He is clinically septic.  Patient had declined additional blood draw, but I had lengthy conversation with him about wound care, wound  assessment and getting additional blood.  Patient has now agreed to getting additional blood and also receiving antibiotics.  Medicine team will take over the care.  They are aware that we have not been able to see the wound.  We have informed him that for now, the goal is to focus on stabilizing him, which includes giving initial antibiotics and also the blood.  They will need to assess for wound care or in the patient's stay.  I will also put in palliative consultation. Medicine team will put psychiatry consultation for capacity.       Deatra Face, MD 11/21/23 [phone]

## 2023-11-21 NOTE — ED Notes (Signed)
 Pollina MD made me aware that the IVC was approved, just waiting for [employer] to bring it to the hospital,but to go ahead and get labworks and medical treatment started

## 2023-11-21 NOTE — ED Notes (Addendum)
 00:45 Patient signed AMA form because he is leaving AMA- 01:00am Patient was not able to get ride- staying but refusing bloodwork

## 2023-11-21 NOTE — Progress Notes (Signed)
 [facility] admitting team IVC note.  Patient suffers from delirium at the moment due to sepsis with significant lactic acidosis, which is likely to result in worsening of her mental state and if untreated could result in significant worsening in his overall mental and physical condition. There is evidence of significant and prolonged deterioration over the past 5 days. He has been offered IV antibiotics, IV fluids and blood transfusion.  He has refused these medications since he was admitted.  He meets criteria for involuntary forced medication as outlined in 10A NCAC 26D .1104. Dr. Brenita Callow has also reviewed the case and is providing the second opinion for non-emergent forced medications (see their note from today).   Reason for the Medication: The patient, without the benefit of the specific treatment measure, is incapable of participating in any available treatment plan that will give the patient a realistic opportunity of improving the patient's condition.   Luke Ryder, MD.

## 2023-11-21 NOTE — Progress Notes (Signed)
 Pharmacy Antibiotic Note  Luke Velasquez is a 23 y.o. male admitted on 11/20/2023 with sepsis and wound infections .  Pharmacy has been consulted for Cefepime  and Vancomycin  dosing.  Plan: Cefepime  2g IV q8h Vancomycin  1000 mg IV q12h  (SCr rounded 0.8, TBW, Vd 0.72, est AUC 506) Measure Vanc levels as needed.  Goal AUC = 400 - 550 Follow up renal function, culture results, and clinical course.      Temp (24hrs), Avg:100.6 F (38.1 C), Min:98.9 F (37.2 C), Max:103 F (39.4 C)  Recent Labs  Lab 11/20/23 0648 11/21/23 0655  WBC 11.0*  --   CREATININE 0.64  --   LATICACIDVEN  --  5.4*    CrCl cannot be calculated (Unknown ideal weight.).    Allergies  Allergen Reactions   Ivp Dye [Iodinated Contrast Media] Swelling    Eye itching and swelling    Antimicrobials this admission: 5/5 Cefepime  >> 5/5 metronidazole  >> 5/5 Vancomycin  >>  Dose adjustments this admission:   Microbiology results: 5/5 BCx:  5/5 MRSA PCR:    Thank you for allowing pharmacy to be a part of this patient's care.  Kendall Pauls PharmD, BCPS [facility] main pharmacy [phone] 11/21/2023 10:34 AM
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
## 2023-11-21 NOTE — Progress Notes (Signed)
 [facility] Liaison Note                 This patient is enrolled in the Home Based Primary Care program of [facility] as well as our Outpatient Palliative Care program.    [facility] Liaison Team will follow for disposition.   Please reach out if there are questions or concerns.   Madelene Schanz, BSN, RN, [facility]  [phone]

## 2023-11-21 NOTE — Progress Notes (Signed)
 Kavien was transferred from ICU. Report given to Jason the night nurse.

## 2023-11-21 NOTE — ED Notes (Signed)
 Followed up with Pollina MD about IVC orders, states he's calling magistrate

## 2023-11-21 NOTE — Plan of Care (Signed)
  Problem: Education: Goal: Knowledge of General Education information will improve Description: Including pain rating scale, medication(s)/side effects and non-pharmacologic comfort measures Outcome: Progressing   Problem: Health Behavior/Discharge Planning: Goal: Ability to manage health-related needs will improve Outcome: Progressing   Problem: Clinical Measurements: Goal: Ability to maintain clinical measurements within normal limits will improve Outcome: Progressing Goal: Will remain free from infection Outcome: Progressing Goal: Diagnostic test results will improve Outcome: Progressing Goal: Respiratory complications will improve Outcome: Progressing Goal: Cardiovascular complication will be avoided Outcome: Progressing   Problem: Activity: Goal: Risk for activity intolerance will decrease Outcome: Progressing   Problem: Nutrition: Goal: Adequate nutrition will be maintained Outcome: Progressing   Problem: Coping: Goal: Level of anxiety will decrease Outcome: Progressing   Problem: Elimination: Goal: Will not experience complications related to bowel motility Outcome: Progressing Goal: Will not experience complications related to urinary retention Outcome: Progressing   Problem: Pain Managment: Goal: General experience of comfort will improve and/or be controlled Outcome: Progressing   Problem: Safety: Goal: Ability to remain free from injury will improve Outcome: Progressing   Problem: Skin Integrity: Goal: Risk for impaired skin integrity will decrease Outcome: Progressing   Problem: Clinical Measurements: Goal: Ability to avoid or minimize complications of infection will improve Outcome: Progressing   Problem: Skin Integrity: Goal: Skin integrity will improve Outcome: Progressing   Problem: Fluid Volume: Goal: Hemodynamic stability will improve Outcome: Progressing   Problem: Clinical Measurements: Goal: Diagnostic test results will  improve Outcome: Progressing Goal: Signs and symptoms of infection will decrease Outcome: Progressing   Problem: Respiratory: Goal: Ability to maintain adequate ventilation will improve Outcome: Progressing   Problem: Education: Goal: Knowledge of the prescribed therapeutic regimen will improve Outcome: Progressing   Problem: Coping: Goal: Ability to identify and develop effective coping behavior will improve Outcome: Progressing   Problem: Clinical Measurements: Goal: Quality of life will improve Outcome: Progressing   Problem: Respiratory: Goal: Verbalizations of increased ease of respirations will increase Outcome: Progressing   Problem: Role Relationship: Goal: Family's ability to cope with current situation will improve Outcome: Progressing Goal: Ability to verbalize concerns, feelings, and thoughts to partner or family member will improve Outcome: Progressing   Problem: Pain Management: Goal: Satisfaction with pain management regimen will improve Outcome: Progressing

## 2023-11-21 NOTE — ED Notes (Signed)
 Patient refused 2nd blood culture. Or 2nd Iv line
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
## 2023-11-21 NOTE — ED Notes (Signed)
 Patient refused his [place] MD made aware

## 2023-11-21 NOTE — ED Notes (Signed)
 Pt mother arrived, informing us  that she would like her son to stay, At this timept's mother told me that the patient's bed sores had been very infected to the point that maggots were found. Pt's in denial that they are coming from his sores. I let Pollina MD aware that pt's mother would like to speak to him.

## 2023-11-21 NOTE — ED Notes (Signed)
 Explained to patient that I needed to get labwork done so we could begin treatment- Pt refused, explained to him that he was now IVC and that we had to get the blood to give him care. If he would let me or security would have to be called. He stated " Nobody is touching me, you can call security."

## 2023-11-22 ENCOUNTER — Other Ambulatory Visit (HOSPITAL_COMMUNITY): Payer: Self-pay

## 2023-11-22 ENCOUNTER — Other Ambulatory Visit: Payer: Self-pay

## 2023-11-22 DIAGNOSIS — Z7189 Other specified counseling: Secondary | ICD-10-CM | POA: Diagnosis not present

## 2023-11-22 DIAGNOSIS — L0390 Cellulitis, unspecified: Secondary | ICD-10-CM | POA: Diagnosis not present

## 2023-11-22 DIAGNOSIS — E440 Moderate protein-calorie malnutrition: Secondary | ICD-10-CM | POA: Insufficient documentation

## 2023-11-22 DIAGNOSIS — A419 Sepsis, unspecified organism: Secondary | ICD-10-CM | POA: Diagnosis not present

## 2023-11-22 DIAGNOSIS — M8639 Chronic multifocal osteomyelitis, multiple sites: Secondary | ICD-10-CM | POA: Diagnosis not present

## 2023-11-22 MED ORDER — HALOPERIDOL LACTATE 2 MG/ML PO CONC
0.6000 mg | ORAL | 0 refills | Status: AC | PRN
Start: 2023-11-22 — End: ?
  Filled 2023-11-22: qty 15, 9d supply, fill #0

## 2023-11-22 MED ORDER — MORPHINE SULFATE (CONCENTRATE) 10 MG /0.5 ML PO SOLN
5.0000 mg | ORAL | 0 refills | Status: AC | PRN
Start: 2023-11-22 — End: ?
  Filled 2023-11-22: qty 30, 10d supply, fill #0

## 2023-11-22 MED ORDER — LORAZEPAM 2 MG/ML PO CONC
1.0000 mg | ORAL | 0 refills | Status: AC | PRN
  Filled 2023-11-22: qty 30, 10d supply, fill #0

## 2023-11-22 NOTE — Progress Notes (Signed)
 Awaiting IVC paperwork to be rescinded for patient to discharge, per Devin, case manager this will have to be done by provider in the morning.
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
## 2023-11-22 NOTE — Plan of Care (Signed)
  Problem: Education: Goal: Knowledge of General Education information will improve Description: Including pain rating scale, medication(s)/side effects and non-pharmacologic comfort measures Outcome: Progressing   Problem: Health Behavior/Discharge Planning: Goal: Ability to manage health-related needs will improve Outcome: Progressing   Problem: Clinical Measurements: Goal: Ability to maintain clinical measurements within normal limits will improve Outcome: Progressing Goal: Will remain free from infection Outcome: Progressing Goal: Diagnostic test results will improve Outcome: Progressing Goal: Respiratory complications will improve Outcome: Progressing Goal: Cardiovascular complication will be avoided Outcome: Progressing   Problem: Activity: Goal: Risk for activity intolerance will decrease Outcome: Progressing   Problem: Nutrition: Goal: Adequate nutrition will be maintained Outcome: Progressing   Problem: Coping: Goal: Level of anxiety will decrease Outcome: Progressing   Problem: Elimination: Goal: Will not experience complications related to bowel motility Outcome: Progressing Goal: Will not experience complications related to urinary retention Outcome: Progressing   Problem: Pain Managment: Goal: General experience of comfort will improve and/or be controlled Outcome: Progressing   Problem: Safety: Goal: Ability to remain free from injury will improve Outcome: Progressing   Problem: Skin Integrity: Goal: Risk for impaired skin integrity will decrease Outcome: Progressing   Problem: Clinical Measurements: Goal: Ability to avoid or minimize complications of infection will improve Outcome: Progressing   Problem: Skin Integrity: Goal: Skin integrity will improve Outcome: Progressing   Problem: Fluid Volume: Goal: Hemodynamic stability will improve Outcome: Progressing   Problem: Clinical Measurements: Goal: Diagnostic test results will  improve Outcome: Progressing Goal: Signs and symptoms of infection will decrease Outcome: Progressing   Problem: Respiratory: Goal: Ability to maintain adequate ventilation will improve Outcome: Progressing   Problem: Education: Goal: Knowledge of the prescribed therapeutic regimen will improve Outcome: Progressing   Problem: Coping: Goal: Ability to identify and develop effective coping behavior will improve Outcome: Progressing   Problem: Clinical Measurements: Goal: Quality of life will improve Outcome: Progressing   Problem: Respiratory: Goal: Verbalizations of increased ease of respirations will increase Outcome: Progressing   Problem: Role Relationship: Goal: Family's ability to cope with current situation will improve Outcome: Progressing Goal: Ability to verbalize concerns, feelings, and thoughts to partner or family member will improve Outcome: Progressing   Problem: Pain Management: Goal: Satisfaction with pain management regimen will improve Outcome: Progressing
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
## 2023-11-22 NOTE — Progress Notes (Signed)
 TOC meds in a secure bag delivered to pt in room by this RN.  Pt verbalized an understanding that lorazepam  needed to be refrigerated. Bag place w/ dressing supplies on window seat.

## 2023-11-23 LAB — URINE CULTURE: Culture: 10000 — AB

## 2023-11-23 NOTE — TOC Progression Note (Addendum)
 Transition of Care [facility]) - Progression Note    Patient Details  Name: Luke Velasquez MRN: [id number] Date of Birth: 2000/12/01  Transition of Care [facility]) CM/SW Contact  Loreda Rodriguez, RN Phone Number:[phone]  11/23/2023, 8:47 AM  Clinical Narrative:    TOC continues to await notice of commitment change paperwork in order to rescind paperwork. MD has been updated and will reach out to psych for completion. CM will upload once completed.   1400 Notice of change in commitment form has been completed and uploaded. Envelope # [id number]. Patient is no longer under ivc. Nurse has been updated.        Expected Discharge Plan and Services         Expected Discharge Date: 11/22/23                                     Social Determinants of Health (SDOH) Interventions SDOH Screenings   Food Insecurity: No Food Insecurity (06/24/2023)  Housing: Low Risk  (06/24/2023)  Transportation Needs: No Transportation Needs (06/24/2023)  Utilities: Not At Risk (06/24/2023)  Depression (PHQ2-9): Low Risk  (06/27/2020)  Recent Concern: Depression (PHQ2-9) - Medium Risk (05/16/2020)  Tobacco Use: High Risk (11/21/2023)    Readmission Risk Interventions     No data to display

## 2023-11-23 NOTE — TOC Progression Note (Signed)
 Transition of Care [facility]) - Progression Note    Patient Details  Name: Luke Velasquez MRN: [id number] Date of Birth: [birth year]  [facility]) CM/SW Contact  Loreda Rodriguez, RN Phone Number:[phone]  11/23/2023, 8:44 AM  Clinical Narrative:    TOC awaiting notice of commitment change paperwork to be completed my MD. MD has been made aware.         Expected Discharge Plan and Services         Expected Discharge Date: 11/22/23                                     Social Determinants of Health (SDOH) Interventions SDOH Screenings   Food Insecurity: No Food Insecurity (06/24/2023)  Housing: Low Risk  (06/24/2023)  Transportation Needs: No Transportation Needs (06/24/2023)  Utilities: Not At Risk (06/24/2023)  Depression (PHQ2-9): Low Risk  (06/27/2020)  Recent Concern: Depression (PHQ2-9) - Medium Risk (05/16/2020)  Tobacco Use: High Risk (11/21/2023)    Readmission Risk Interventions     No data to display

## 2023-11-23 NOTE — Progress Notes (Addendum)
 [facility] Liaison Note  Received request from [facility] for hospice services at home after discharge. Spoke with patient's mother to initiate education related to hospice philosophy, services and team approach to care. Mother verbalized understanding of information given. Per discussion, the plan is for discharge home tomorrow.   DME needs discussed. Patient has the following equipment in the home: none Patient requests the following equipment for delivery: admissions nurse to assess for needs.  Please send signed and completed DNR home with patient/family. Please provide prescriptions at discharge as needed to ensure ongoing symptom management.  [facility] information and contact numbers given to mother. Please call with any concerns.  Thank you for the opportunity to participate in this patient's care.   [name], LPN [facility] Liaison [phone]

## 2023-11-23 NOTE — TOC Transition Note (Signed)
 Transition of Care [facility]) - Discharge Note   Patient Details  Name: Canaan Schwendiman MRN: [id number] Date of Birth: [birth year]  Transition [facility] Care [facility]) CM/SW Contact:  Loreda Rodriguez, RN Phone Number:[phone]  11/23/2023, 3:04 PM   Clinical Narrative:    Patient with discharge orders. Transportation has been set up with [facility]. Address verified with patient. TOC will sign off.    Final next level of care: Home w Hospice Care Barriers to Discharge: No Barriers Identified   Patient Goals and CMS Choice Patient states their goals for this hospitalization and ongoing recovery are:: Ready to go home   Choice offered to / list presented to : NA [place] ownership interest in [facility].provided to::  (n/a)    Discharge Placement                       Discharge Plan and Services Additional resources added to the After Visit Summary for   In-house Referral: NA Discharge Planning Services: CM Consult Post Acute Care Choice: NA          DME Arranged: N/A DME Agency: NA       HH Arranged: NA HH Agency: NA        Social Drivers of Health (SDOH) Interventions SDOH Screenings   Food Insecurity: No Food Insecurity (06/24/2023)  Housing: Low Risk  (06/24/2023)  Transportation Needs: No Transportation Needs (06/24/2023)  Utilities: Not At Risk (06/24/2023)  Depression (PHQ2-9): Low Risk  (06/27/2020)  Recent Concern: Depression (PHQ2-9) - Medium Risk (05/16/2020)  Tobacco Use: High Risk (11/21/2023)     Readmission Risk Interventions     No data to display

## 2023-11-23 NOTE — Progress Notes (Signed)
 Patient left w/ [facility] on a stretcher. VSS, no signs of distress, urinary catheter empty and in place , ostomy bag had minimal output. Patient said family was contacted and is aware of his arrival.

## 2023-11-23 NOTE — Progress Notes (Signed)
 Patient remains stable for discharge home with hospice.  Aneita Keens, MD [facility] 11/23/2023, 6:25 PM

## 2023-11-23 NOTE — TOC Initial Note (Signed)
 Transition of Care [facility] - Initial/Assessment Note    Patient Details  Name: Luke Velasquez MRN: [id number] Date of Birth: 07/10/2001  Transition of Care [facility]) CM/SW Contact:    Loreda Rodriguez, RN Phone [phone]  11/23/2023, 3:01 PM  Clinical Narrative:                 TOC following patient admitted from home paraplegia. From home with mom who is the primary caregiver.    Expected Discharge Plan: Home w Hospice Care Barriers to Discharge: No Barriers Identified   Patient Goals and CMS Choice Patient states their goals for this hospitalization and ongoing recovery are:: Ready to go home   Choice offered to / list presented to : NA [place] ownership interest in [facility].provided to::  (n/a)    Expected Discharge Plan and Services In-house Referral: NA Discharge Planning Services: CM Consult Post Acute Care Choice: NA Living arrangements for the past 2 months: Single Family Home Expected Discharge Date: 11/22/23               DME Arranged: N/A DME Agency: NA       HH Arranged: NA HH Agency: NA        Prior Living Arrangements/Services Living arrangements for the past 2 months: Single Family Home Lives with:: Parents Patient language and need for interpreter reviewed:: Yes Do you feel safe going back to the place where you live?: Yes      Need for Family Participation in Patient Care: Yes (Comment) Care giver support system in place?: Yes (comment) Current home services: DME Criminal Activity/Legal Involvement Pertinent to Current Situation/Hospitalization: No - Comment as needed  Activities of Daily Living   ADL Screening (condition at time of admission) Independently performs ADLs?: No Does the patient have a NEW difficulty with bathing/dressing/toileting/self-feeding that is expected to last >3 days?: No Does the patient have a NEW difficulty with getting in/out of bed, walking, or climbing stairs that is expected to last >3 days?:  No Does the patient have a NEW difficulty with communication that is expected to last >3 days?: No Is the patient deaf or have difficulty hearing?: No Does the patient have difficulty seeing, even when wearing glasses/contacts?: No Does the patient have difficulty concentrating, remembering, or making decisions?: Yes  Permission Sought/Granted Permission sought to share information with : Family Supports Permission granted to share information with : Yes, Verbal Permission Granted  Share Information with NAME: Gale Jude     Permission granted to share info w Relationship: mother     Emotional Assessment Appearance:: Appears stated age Attitude/Demeanor/Rapport: Gracious Affect (typically observed): Quiet Orientation: : Oriented to Self, Oriented to Place, Oriented to  Time, Oriented to Situation Alcohol / Substance Use: Not Applicable Psych Involvement: No (comment)  Admission diagnosis:  Sepsis due to cellulitis (HCC) [L03.90, A41.9] Sepsis without acute organ dysfunction, due to unspecified organism [facility]) [A41.9] Patient Active Problem List   Diagnosis Date Noted   Malnutrition of moderate degree 11/22/2023   Sepsis due to cellulitis (HCC) 11/21/2023   Symptomatic anemia 11/21/2023   Lactic acidosis 11/21/2023   Bipolar disorder (HCC) 11/21/2023   Impaired mobility 11/21/2023   Insomnia due to medical condition 06/25/2023   Sepsis secondary to UTI (HCC) 06/24/2023   Sacral decubitus ulcer 06/24/2023   Chronic osteomyelitis of pelvic region (HCC) 06/24/2023   Acute blood loss anemia 06/24/2023   History of DVT (deep vein thrombosis) 06/24/2023   Thrombocytosis 06/24/2023   Involuntary commitment 06/24/2023  Pressure injury of skin 02/14/2023   Protein-calorie malnutrition, severe (HCC) 02/14/2023   UTI (urinary tract infection) 02/14/2023   Drainage from wound 02/12/2023   PTSD (post-traumatic stress disorder) 12/07/2022   Psychosis (HCC) 08/06/2022    Polysubstance abuse (HCC) 08/06/2022   Impaired mobility and ADLs 03/10/2022   Colostomy in place [facility]) 05/16/2020   Paraplegia (HCC) 05/16/2020   Spasticity 05/16/2020   Neurogenic bladder 05/16/2020   Wheelchair dependence 05/16/2020   PCP:  Chanetta Comes, [facility] Pharmacy:   [id number] [place], [place] - [address] AT [facility] [address] [facility] [address] [place] [address] Phone: [phone] Fax: [phone]  [id number] - [place], [place] - [address] AT CORNER OF [facility] [address] [place] [address] Phone: [phone] Fax: [phone]  [name] [address] [place] [address] Phone: [phone] Fax: [phone]  [facility]ode[facility] Spruce [facility] - [facility] [address] [place] [address] Phone: [phone] Fax: [phone]     Social Drivers of Health (SDOH) Social History: SDOH Screenings   Food Insecurity: No Food Insecurity (06/24/2023)  Housing: Low Risk  (06/24/2023)  Transportation Needs: No Transportation Needs (06/24/2023)  Utilities: Not At Risk (06/24/2023)  Depression (PHQ2-9): Low Risk  (06/27/2020)  Recent Concern: Depression (PHQ2-9) - Medium Risk (05/16/2020)  Tobacco Use: High Risk (11/21/2023)   SDOH Interventions:     Readmission Risk Interventions     No data to display

## 2023-11-25 LAB — TYPE AND SCREEN
ABO/RH(D): O POS
Antibody Screen: NEGATIVE
Unit division: 0
Unit division: 0

## 2023-11-25 LAB — BPAM RBC
Blood Product Expiration Date: 202506022359
Blood Product Expiration Date: 202506032359
Unit Type and Rh: 5100
Unit Type and Rh: 5100

## 2023-11-26 LAB — CULTURE, BLOOD (ROUTINE X 2)
Culture: NO GROWTH
Culture: NO GROWTH

## 2023-12-01 ENCOUNTER — Observation Stay (HOSPITAL_COMMUNITY)
Admission: EM | Admit: 2023-12-01 | Discharge: 2023-12-02 | Disposition: A | Discharge status: Hospice | Attending: Internal Medicine | Admitting: Internal Medicine

## 2023-12-01 ENCOUNTER — Emergency Department (HOSPITAL_COMMUNITY)

## 2023-12-01 ENCOUNTER — Encounter (HOSPITAL_COMMUNITY): Payer: Self-pay

## 2023-12-01 ENCOUNTER — Other Ambulatory Visit: Payer: Self-pay

## 2023-12-01 DIAGNOSIS — R252 Cramp and spasm: Secondary | ICD-10-CM | POA: Diagnosis present

## 2023-12-01 DIAGNOSIS — L89153 Pressure ulcer of sacral region, stage 3: Secondary | ICD-10-CM | POA: Insufficient documentation

## 2023-12-01 DIAGNOSIS — Z86718 Personal history of other venous thrombosis and embolism: Secondary | ICD-10-CM

## 2023-12-01 DIAGNOSIS — B9620 Unspecified Escherichia coli [E. coli] as the cause of diseases classified elsewhere: Secondary | ICD-10-CM | POA: Insufficient documentation

## 2023-12-01 DIAGNOSIS — D649 Anemia, unspecified: Principal | ICD-10-CM

## 2023-12-01 DIAGNOSIS — L89154 Pressure ulcer of sacral region, stage 4: Secondary | ICD-10-CM

## 2023-12-01 DIAGNOSIS — N390 Urinary tract infection, site not specified: Secondary | ICD-10-CM | POA: Diagnosis not present

## 2023-12-01 DIAGNOSIS — F3161 Bipolar disorder, current episode mixed, mild: Secondary | ICD-10-CM | POA: Diagnosis not present

## 2023-12-01 DIAGNOSIS — Z933 Colostomy status: Secondary | ICD-10-CM | POA: Diagnosis not present

## 2023-12-01 DIAGNOSIS — D75839 Thrombocytosis, unspecified: Secondary | ICD-10-CM | POA: Diagnosis not present

## 2023-12-01 DIAGNOSIS — N3000 Acute cystitis without hematuria: Secondary | ICD-10-CM | POA: Diagnosis not present

## 2023-12-01 DIAGNOSIS — R079 Chest pain, unspecified: Secondary | ICD-10-CM | POA: Diagnosis present

## 2023-12-01 DIAGNOSIS — G8220 Paraplegia, unspecified: Secondary | ICD-10-CM | POA: Diagnosis not present

## 2023-12-01 DIAGNOSIS — Z1152 Encounter for screening for COVID-19: Secondary | ICD-10-CM | POA: Diagnosis not present

## 2023-12-01 DIAGNOSIS — Z79899 Other long term (current) drug therapy: Secondary | ICD-10-CM | POA: Diagnosis not present

## 2023-12-01 DIAGNOSIS — Z7189 Other specified counseling: Secondary | ICD-10-CM

## 2023-12-01 DIAGNOSIS — F4310 Post-traumatic stress disorder, unspecified: Secondary | ICD-10-CM | POA: Diagnosis present

## 2023-12-01 DIAGNOSIS — F319 Bipolar disorder, unspecified: Secondary | ICD-10-CM | POA: Diagnosis not present

## 2023-12-01 DIAGNOSIS — G4700 Insomnia, unspecified: Secondary | ICD-10-CM | POA: Diagnosis not present

## 2023-12-01 DIAGNOSIS — L89159 Pressure ulcer of sacral region, unspecified stage: Secondary | ICD-10-CM | POA: Diagnosis present

## 2023-12-01 DIAGNOSIS — G4701 Insomnia due to medical condition: Secondary | ICD-10-CM | POA: Diagnosis present

## 2023-12-01 DIAGNOSIS — F17210 Nicotine dependence, cigarettes, uncomplicated: Secondary | ICD-10-CM | POA: Diagnosis not present

## 2023-12-01 LAB — COMPREHENSIVE METABOLIC PANEL WITH GFR
ALT: 19 U/L (ref 0–44)
AST: 18 U/L (ref 15–41)
Albumin: 1.8 g/dL — ABNORMAL LOW (ref 3.5–5.0)
Alkaline Phosphatase: 202 U/L — ABNORMAL HIGH (ref 38–126)
Anion gap: 8 (ref 5–15)
BUN: 12 mg/dL (ref 6–20)
CO2: 24 mmol/L (ref 22–32)
Calcium: 7.9 mg/dL — ABNORMAL LOW (ref 8.9–10.3)
Chloride: 103 mmol/L (ref 98–111)
Creatinine, Ser: 0.59 mg/dL — ABNORMAL LOW (ref 0.61–1.24)
GFR, Estimated: >60 mL/min (ref >60)
Glucose, Bld: 102 mg/dL — ABNORMAL HIGH (ref 70–99)
Potassium: 4.2 mmol/L (ref 3.5–5.1)
Sodium: 135 mmol/L (ref 135–145)
Total Bilirubin: <0.2 mg/dL (ref 0.0–1.2)
Total Protein: 7.1 g/dL (ref 6.5–8.1)

## 2023-12-01 LAB — CBC WITH DIFFERENTIAL/PLATELET
Abs Immature Granulocytes: 0.04 10*3/uL (ref 0.00–0.07)
Basophils Absolute: 0 10*3/uL (ref 0.0–0.1)
Basophils Relative: 0 %
Eosinophils Absolute: 0.1 10*3/uL (ref 0.0–0.5)
Eosinophils Relative: 1 %
HCT: 22.7 % — ABNORMAL LOW (ref 39.0–52.0)
Hemoglobin: 6.4 g/dL — CL (ref 13.0–17.0)
Immature Granulocytes: 0 %
Lymphocytes Relative: 21 %
Lymphs Abs: 2.5 10*3/uL (ref 0.7–4.0)
MCH: 23.4 pg — ABNORMAL LOW (ref 26.0–34.0)
MCHC: 28.2 g/dL — ABNORMAL LOW (ref 30.0–36.0)
MCV: 83.2 fL (ref 80.0–100.0)
Monocytes Absolute: 0.6 10*3/uL (ref 0.1–1.0)
Monocytes Relative: 5 %
Neutro Abs: 8.6 10*3/uL — ABNORMAL HIGH (ref 1.7–7.7)
Neutrophils Relative %: 73 %
Platelets: 689 10*3/uL — ABNORMAL HIGH (ref 150–400)
RBC: 2.73 MIL/uL — ABNORMAL LOW (ref 4.22–5.81)
RDW: 24.2 % — ABNORMAL HIGH (ref 11.5–15.5)
WBC: 11.9 10*3/uL — ABNORMAL HIGH (ref 4.0–10.5)
nRBC: 0 % (ref 0.0–0.2)

## 2023-12-01 LAB — URINALYSIS, W/ REFLEX TO CULTURE (INFECTION SUSPECTED)
Bilirubin Urine: NEGATIVE
Glucose, UA: NEGATIVE mg/dL
Hgb urine dipstick: NEGATIVE
Ketones, ur: NEGATIVE mg/dL
Nitrite: POSITIVE — AB
Protein, ur: 30 mg/dL — AB
Specific Gravity, Urine: 1.015 (ref 1.005–1.030)
WBC, UA: >50 WBC/hpf (ref 0–5)
pH: 8 (ref 5.0–8.0)

## 2023-12-01 LAB — PROTIME-INR
INR: 1.3 — ABNORMAL HIGH (ref 0.8–1.2)
Prothrombin Time: 16.2 s — ABNORMAL HIGH (ref 11.4–15.2)

## 2023-12-01 LAB — RESP PANEL BY RT-PCR (RSV, FLU A&B, COVID)  RVPGX2
Influenza A by PCR: NEGATIVE
Influenza B by PCR: NEGATIVE
Resp Syncytial Virus by PCR: NEGATIVE
SARS Coronavirus 2 by RT PCR: NEGATIVE

## 2023-12-01 LAB — TROPONIN I (HIGH SENSITIVITY)
Troponin I (High Sensitivity): 3 ng/L (ref <18)
Troponin I (High Sensitivity): 3 ng/L (ref <18)

## 2023-12-01 LAB — I-STAT CG4 LACTIC ACID, ED
Lactic Acid, Venous: 1 mmol/L (ref 0.5–1.9)
Lactic Acid, Venous: 0.7 mmol/L (ref 0.5–1.9)

## 2023-12-01 MED ORDER — LORAZEPAM 2 MG/ML PO CONC: 1.0000 mg | ORAL | Status: DC | PRN

## 2023-12-01 MED ORDER — ONDANSETRON HCL 4 MG/2ML IJ SOLN: 4.0000 mg | Freq: Four times a day (QID) | INTRAMUSCULAR | Status: DC | PRN

## 2023-12-01 MED ORDER — ACETAMINOPHEN 650 MG RE SUPP: 650.0000 mg | Freq: Four times a day (QID) | RECTAL | Status: DC | PRN

## 2023-12-01 MED ORDER — SODIUM CHLORIDE 0.9 % IV BOLUS
500.0000 mL | Freq: Once | INTRAVENOUS | Status: AC
  Administered 2023-12-01: 500 mL via INTRAVENOUS

## 2023-12-01 MED ORDER — LACTATED RINGERS IV BOLUS
1000.0000 mL | Freq: Once | INTRAVENOUS | Status: AC
  Administered 2023-12-01: 1000 mL via INTRAVENOUS

## 2023-12-01 MED ORDER — SODIUM CHLORIDE 0.9 % IV SOLN
1.0000 g | Freq: Once | INTRAVENOUS | Status: AC
  Administered 2023-12-01: 1 g via INTRAVENOUS
  Filled 2023-12-01: qty 10

## 2023-12-01 MED ORDER — ACETAMINOPHEN 325 MG PO TABS
650.0000 mg | ORAL_TABLET | Freq: Once | ORAL | Status: AC
  Administered 2023-12-01: 650 mg via ORAL
  Filled 2023-12-01: qty 2

## 2023-12-01 MED ORDER — ONDANSETRON HCL 4 MG PO TABS: 4.0000 mg | ORAL_TABLET | Freq: Four times a day (QID) | ORAL | Status: DC | PRN

## 2023-12-01 MED ORDER — PANTOPRAZOLE SODIUM 40 MG PO TBEC
40.0000 mg | DELAYED_RELEASE_TABLET | Freq: Every day | ORAL | Status: DC
  Filled 2023-12-01 (×2): qty 1

## 2023-12-01 MED ORDER — OXYCODONE HCL 5 MG PO TABS: 5.0000 mg | ORAL_TABLET | Freq: Four times a day (QID) | ORAL | Status: DC | PRN

## 2023-12-01 MED ORDER — SODIUM CHLORIDE 0.9 % IV SOLN: INTRAVENOUS | Status: DC

## 2023-12-01 MED ORDER — MIRTAZAPINE 15 MG PO TABS
15.0000 mg | ORAL_TABLET | Freq: Every day | ORAL | Status: DC
  Administered 2023-12-01: 15 mg via ORAL
  Filled 2023-12-01: qty 1

## 2023-12-01 MED ORDER — MORPHINE SULFATE (CONCENTRATE) 10 MG /0.5 ML PO SOLN: 5.0000 mg | ORAL | Status: DC | PRN

## 2023-12-01 MED ORDER — ACETAMINOPHEN 325 MG PO TABS: 650.0000 mg | ORAL_TABLET | Freq: Four times a day (QID) | ORAL | Status: DC | PRN

## 2023-12-01 MED ORDER — SODIUM CHLORIDE 0.9 % IV SOLN
1.0000 g | INTRAVENOUS | Status: DC
  Administered 2023-12-02: 1 g via INTRAVENOUS
  Filled 2023-12-01: qty 10

## 2023-12-01 NOTE — ED Notes (Signed)
 Pt in bed with eyes open, pt reports 2/10 back pain, states that he doesn't need anything for pain at this time, pt oriented to person, place, month and year, pt doesn't know day of the week, re oriented pt.  Pt states that he is dry and doesn't need to be changed, pt has no requests at this time.  Updated pt on plan of care

## 2023-12-01 NOTE — ED Notes (Signed)
 Pt presents to be soiled, however pt refuses to let staff assess or clean lower body, including PA assessment. PA aware at bedside.

## 2023-12-01 NOTE — Progress Notes (Signed)
 Luke Velasquez [address] [facility] Liaison Note?     [name] is a current [facility] hospice patient with a terminal diagnosis of unspecified severe protein calorie malnutrition. On the morning of 5.15, patient's mother called [facility] to advise that patient had called 911 due to symptoms of chest pain and mental health concerns. Nurse visit was offered and declined as EMS was already present and patient asked to go to the hospital. Patient was admitted with concerns for possible sepsis and UTI. Per Dr. Tessie Fila with [facility] this is a related hospital admission.   Visited with patient at bedside. He was resting comfortably and advised he had no complaints of pain or discomfort. Exchanged report with floor nurse. Patient will not allow any care other than IV antibiotics and linen changes.         Patient is inpatient appropriate due to need for IV antibiotics.    Vital Signs:?98/110/18    108/64    100% on RA   I/O:  1,100/not recorded   Labs: 12/01/23 03:55 COMPREHENSIVE METABOLIC PANEL WITH GFR:  Glucose: 102 (H) Creatinine: 0.59 (L) Calcium: 7.9 (L) Alkaline Phosphatase: 202 (H) Albumin : 1.8 (L) WBC: 11.9 (H) RBC: 2.73 (L) Hemoglobin: 6.4 (LL) HCT: 22.7 (L) MCH: 23.4 (L) MCHC: 28.2 (L) RDW: 24.2 (H) Platelets: 689 (H) NEUT#: 8.6 (H) Ovalocytes: PRESENT Schistocytes: PRESENT Stomatocytes: PRESENT WBC Morphology: MORPHOLOGY UNREMARKABLE Smear Review: MORPHOLOGY UNREMARKABLE Prothrombin Time: 16.2 (H) INR: 1.3 (H) URINALYSIS, W/ REFLEX TO CULTURE (INFECTION SUSPECTED): Rpt ! Appearance: CLOUDY ! Bilirubin Urine: NEGATIVE Color, Urine: YELLOW Glucose, UA: NEGATIVE Hgb urine dipstick: NEGATIVE Ketones, ur: NEGATIVE Leukocytes,Ua: LARGE ! Nitrite: POSITIVE ! pH: 8.0 Protein: 30 ! Specific Gravity, Urine: 1.015 Specimen Source: URINE, CLEAN CATCH Bacteria, UA: MANY ! Mucus: PRESENT RBC / HPF: 0-5 Squamous Epithelial / HPF: 0-5 WBC Clumps:  PRESENT WBC, UA: >50 URINE CULTURE: Rpt    Diagnostics:  CLINICAL DATA:  Sepsis   EXAM: PORTABLE CHEST 1 VIEW   COMPARISON:  11/20/2023   FINDINGS: Lungs are clear. No pneumothorax or pleural effusion. Shrapnel overlies the left lung base. Cardiac size within normal limits. Pulmonary vascularity is normal. T7-T12 spinal fusion rods again noted. No acute bone abnormality.   IMPRESSION: 1. No active disease.    Electronically Signed   By: Worthy Heads M.D.   On: 12/01/2023 04:29  IV/PRN Meds: Lactated Ringers  1,000 mL IV x1, Rocephin  1g IV x1, NS IV continuous    Problem list per MD note Amon Kand, MD 5.15.25:  Assessment and Plan: Principal Problem:   UTI (urinary tract infection) Admit to PCU/inpatient. NS 500 mL bolus. -Then NS 62 mL/h x 16 hours. Continue ceftriaxone  1 g IVPB daily. Follow-up urine culture and sensitivity.. Follow-up blood culture and sensitivity. Follow-up CBC and chemistry in the morning.   Active Problems:   Sacral decubitus ulcer Declining evaluation or treatment for his wounds.     Symptomatic anemia Declined blood transfusion of PRBC. Will follow H&H in AM.     History of DVT (deep vein thrombosis) Will defer chemical prophylaxis given anemia/blood transfusion refusal.     Thrombocytosis In the setting of anemia. Monitor platelet count.     Paraplegia (HCC) Associated with:   Spasticity Would like something not liquid. Would like to try something other than morphine . Agreed on tablets -Oxycodone  5 mg every 6 hr PRN ordered. - May use lorazepam  for spasticity.     PTSD (post-traumatic stress disorder)   Insomnia due to medical  condition   Bipolar disorder (HCC) Continue lorazepam  as needed for anxiety. Agreed to try something for insomnia. -Will try mirtazapine 15 mg p.o. nightly.    Discharge Planning:?Home after course of IV antibiotics   Family contact: spoke with mother by phone    IDT:? Updated?     Goals of  Care: DNR   If patient requires EMS transport at discharge, please us  [facility] as that is who [facility] is contracted with for transport.   Please call with any hospice related questions or concerns.   [name], LPN [facility] Liaison [phone]

## 2023-12-01 NOTE — ED Triage Notes (Signed)
 Pt. Arrives via [facility] for chest pain and SOB. Pt. Is a paraplegic and hospice patient. Denies NVD. Endorses discomfort in jaw and neck. Pain is coming and going.

## 2023-12-01 NOTE — Progress Notes (Signed)
    Patient: Luke Velasquez MRN: [id number]   DOA: 12/01/2023  I spoke to the patient's mother Lucia Russian) who stated the patient has been having hallucinations and manic symptoms at home.  He has being afraid to spending time alone.  However, he has been noncompliant with his medications including analgesics.  He has been complaining of insomnia, but has not been taking the trazodone .  I discussed with her getting a psychiatry consult.  His mother still believes that he does not have a full understanding of his symptoms and condition.  She would like to have a call from and discuss with psychiatry his behavioral symptomatology she thinks that he will not disclose all the information and does not have full capacity at this time.  Lucienne Ryder, MD
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
## 2023-12-01 NOTE — ED Provider Notes (Signed)
 [place] EMERGENCY DEPARTMENT AT [facility] Provider Note   CSN: [id number] Arrival date & time: 12/01/23  0272     History Chief Complaint  Patient presents with   Chest Pain    Luke Velasquez is a 23 y.o. male. Patient with a history of paraplegia, neurogenic bladder, sepsis secondary to UTI, osteomyelitis of the pelvic region, malnutrition presents to the ED today with concerns of chest pain. States that he has been having chest pain and some SOB. Patient is currently on hospice care. Was admitted recently for similar symptoms but at that time found to be septic. During last ED visit, patient was refusion labs and treatment. Required IVC placement by mother for treatment. Denies any other specific symptoms beyond chest pain and SOB at this time. Denies abdominal tenderness, nausea, vomiting, or any other concerns.   Chest Pain      Home Medications Prior to Admission medications   Medication Sig Start Date End Date Taking? Authorizing Provider  haloperidol  (HALDOL ) 2 MG/ML solution Place 0.3 mLs (0.6 mg total) under the tongue every 4 (four) hours as needed for agitation (or delirium). 11/22/23  Yes Dahal, Aminta Baldy, MD  LORazepam  (ATIVAN ) 2 MG/ML concentrated solution Take 0.5 mLs (1 mg total) by mouth every 4 (four) hours as needed for anxiety. 11/22/23  Yes Dahal, Aminta Baldy, MD  Morphine  Sulfate (MORPHINE  CONCENTRATE) 10 mg / 0.5 ml concentrated solution Take 0.25 mLs (5 mg total) by mouth every 2 (two) hours as needed for moderate pain (pain score 4-6) (or dyspnea). 11/22/23  Yes Dahal, Aminta Baldy, MD      Allergies    Ivp dye [iodinated contrast media]    Review of Systems   Review of Systems  Cardiovascular:  Positive for chest pain.  All other systems reviewed and are negative.   Physical Exam Updated Vital Signs BP (!) 98/58 (BP Location: Right Arm)   Pulse (!) 120   Temp 99.1 F (37.3 C) (Oral)   Resp 18   SpO2 100%  Physical Exam Vitals and nursing note  reviewed.  Constitutional:      General: He is not in acute distress.    Appearance: He is well-developed.  HENT:     Head: Normocephalic and atraumatic.  Eyes:     Conjunctiva/sclera: Conjunctivae normal.  Cardiovascular:     Rate and Rhythm: Normal rate and regular rhythm.     Heart sounds: No murmur heard. Pulmonary:     Effort: Pulmonary effort is normal. No respiratory distress.     Breath sounds: Normal breath sounds. No decreased breath sounds, wheezing or rhonchi.  Abdominal:     Palpations: Abdomen is soft.     Tenderness: There is no abdominal tenderness.  Genitourinary:    Comments: Urine in bag appears to be somewhat cloudy. Musculoskeletal:        General: No swelling.     Cervical back: Neck supple.  Skin:    General: Skin is warm and dry.     Capillary Refill: Capillary refill takes less than 2 seconds.     Comments: Patient refusing skin evaluation. Has previously been noted to have significant skin breakdown in numerous areas.  Neurological:     Mental Status: He is alert.  Psychiatric:        Mood and Affect: Mood normal.     ED Results / Procedures / Treatments   Labs (all labs ordered are listed, but only abnormal results are displayed) Labs Reviewed  COMPREHENSIVE METABOLIC PANEL WITH  GFR - Abnormal; Notable for the following components:      Result Value   Glucose, Bld 102 (*)    Creatinine, Ser 0.59 (*)    Calcium 7.9 (*)    Albumin  1.8 (*)    Alkaline Phosphatase 202 (*)    All other components within normal limits  CBC WITH DIFFERENTIAL/PLATELET - Abnormal; Notable for the following components:   WBC 11.9 (*)    RBC 2.73 (*)    Hemoglobin 6.4 (*)    HCT 22.7 (*)    MCH 23.4 (*)    MCHC 28.2 (*)    RDW 24.2 (*)    Platelets 689 (*)    Neutro Abs 8.6 (*)    All other components within normal limits  PROTIME-INR - Abnormal; Notable for the following components:   Prothrombin Time 16.2 (*)    INR 1.3 (*)    All other components within  normal limits  URINALYSIS, W/ REFLEX TO CULTURE (INFECTION SUSPECTED) - Abnormal; Notable for the following components:   APPearance CLOUDY (*)    Protein, ur 30 (*)    Nitrite POSITIVE (*)    Leukocytes,Ua LARGE (*)    Bacteria, UA MANY (*)    All other components within normal limits  RESP PANEL BY RT-PCR (RSV, FLU A&B, COVID)  RVPGX2  CULTURE, BLOOD (ROUTINE X 2)  CULTURE, BLOOD (ROUTINE X 2)  URINE CULTURE  I-STAT CG4 LACTIC ACID, ED  I-STAT CG4 LACTIC ACID, ED  TROPONIN I (HIGH SENSITIVITY)  TROPONIN I (HIGH SENSITIVITY)    EKG EKG Interpretation Date/Time:  Thursday Dec 01 2023 03:45:13 EDT Ventricular Rate:  137 PR Interval:  129 QRS Duration:  74 QT Interval:  268 QTC Calculation: 405 R Axis:   249  Text Interpretation: Sinus tachycardia Probable right ventricular hypertrophy Since last tracing rate faster Otherwise no significant change Confirmed by Townsend Freud [phone]) on 12/01/2023 4:16:30 AM  Radiology DG Chest Port 1 View Result Date: 12/01/2023 CLINICAL DATA:  Sepsis EXAM: PORTABLE CHEST 1 VIEW COMPARISON:  11/20/2023 FINDINGS: Lungs are clear. No pneumothorax or pleural effusion. Shrapnel overlies the left lung base. Cardiac size within normal limits. Pulmonary vascularity is normal. T7-T12 spinal fusion rods again noted. No acute bone abnormality. IMPRESSION: 1. No active disease. Electronically Signed   By: Worthy Heads M.D.   On: 12/01/2023 04:29    Procedures Procedures    Medications Ordered in ED Medications  lactated ringers  bolus 1,000 mL (0 mLs Intravenous Stopped 12/01/23 0512)  acetaminophen  (TYLENOL ) tablet 650 mg (650 mg Oral Given 12/01/23 0453)  cefTRIAXone  (ROCEPHIN ) 1 g in sodium chloride  0.9 % 100 mL IVPB (0 g Intravenous Stopped 12/01/23 0547)    ED Course/ Medical Decision Making/ A&P Clinical Course as of 12/01/23 0639  Thu Dec 01, 2023  0456 Hemoglobin(!!): 6.4 Discussed need for transfusion. Patient declining at this time. [OZ]   0456 Urinalysis, w/ Reflex to Culture (Infection Suspected) -Urine, Clean Catch(!) Consistent with infection. Patient consenting to treatment. IV Rocephin  ordered. [OZ]    Clinical Course User Index [OZ] Concetta Dee, PA-C                                 Medical Decision Making Amount and/or Complexity of Data Reviewed Labs: ordered. Decision-making details documented in ED Course. Radiology: ordered.  Risk OTC drugs.   This patient presents to the ED for concern of chest pain. Differential diagnosis  includes ACS, pneumonia, GERD, cellulitis, UTI, sepsis   Lab Tests:  I Ordered, and personally interpreted labs.  The pertinent results include:  CBC with mild leukocytosis at 11.9, hemoglobin low but improving at 6.4 compared to 5.9 10 days ago. UA consistent with infection with bacteria, leukocytes, and nitrites present. Lactic acid level normal prior to IV fluids.   Imaging Studies ordered:  I ordered imaging studies including chest xray  I independently visualized and interpreted imaging which showed unremarkable I agree with the radiologist interpretation   Medicines ordered and prescription drug management:  I ordered medication including Tylenol , fluids, Rocephin  for fever, dehydration, UTI  Reevaluation of the patient after these medicines showed that the patient improved I have reviewed the patients home medicines and have made adjustments as needed   Problem List / ED Course:  Patient with history of paraplegia, neurogenic bladder, osteomyelitis, sepsis secondary to UTI presents to the ED today for concerns of chest pain. He states he began to experience chest pain this evening with some associated SOB but will not elaborate further. He states he also feels "loopy" and has for some time. I spoke with patient's mother who states that patient was made comfort care, DNR, DNI during hospitalization 10 days ago so she is unsure why he went to the ED today as he  appeared fine from what she had seen of him earlier. They have not been able to see hospice/palliative care at home yet. Also spoke with [facility] representative who confirmed that he is under their care and will follow along with him regardless of disposition from the ED today. Patient initially apprehensive to have any labs, imaging, or EKG performed. Initial concern for sepsis with low grade fever and tachycardia. Able to discuss concerns with patient and he consents to the labs and fluids. Considering antibiotics if needed. Labs show leukocytosis at 11.9 but normal lactic acid. Hemoglobin notably low at 6.4 but improvement compared to prior 10 days ago at 5.9. Refusing blood transfusion. Will not allow for wound evaluation. Patient is refusing PO antibiotics for home and would prefer to continue with IV antibiotics. He has several other indications for admission beyond UTI but this is the only reason he is wanting to be admitted. Will consult hospitalist for admission. I did advise patient with most recent urine culture, PO antibiotics are reasonable but he will not consent to discharge with PO antibiotics and instead wants to be admitted for treatment of this. Given a borderline sepsis/urosepsis picture, I will reach out to hospitalist to discuss his care. Spoke with Dr. Brice Campi, hospitalist, who will evaluate patient for admission.   Social Determinants of Health:  History of paraplegia Hospice patient  Final Clinical Impression(s) / ED Diagnoses Final diagnoses:  Symptomatic anemia  Urinary tract infection associated with indwelling urethral catheter, initial encounter [facility])    Rx / DC Orders ED Discharge Orders     None         Concetta Dee, PA-C 12/01/23 5621    Lindle Rhea, MD 12/01/23 [phone]

## 2023-12-01 NOTE — Progress Notes (Signed)
 Luke Velasquez [age]- [employer], contracting Hospice liaison note     This patient is a current hospice patient with [facility]. Please see media tab for detailed hospice report.    Liaison will continue to follow for any discharge planning needs and to coordinate continuation of hospice care.    Please don't hesitate to call with any Hospice related questions or concerns.    Thank you for the opportunity to participate in this patient's care.   [name], LPN [facility] Liaison [phone]

## 2023-12-01 NOTE — Progress Notes (Signed)
 Pt arrived to unit from ED. RN and CNA explained to pt that we needed to clean him up using CHG & foley wipes. Pt refused to let staff clean him even after pt was educated on importance of cleansers. Pt also educated pt that upon admission, 2 Rns needed to assess his skin to make sure there aren't any wounds/assess current wounds. Pt refused, charge nurse & MD aware.
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
## 2023-12-01 NOTE — ED Notes (Signed)
 Pt refuses second stick for 2nd set of blood cultures.

## 2023-12-02 DIAGNOSIS — M869 Osteomyelitis, unspecified: Secondary | ICD-10-CM

## 2023-12-02 DIAGNOSIS — F3161 Bipolar disorder, current episode mixed, mild: Secondary | ICD-10-CM | POA: Diagnosis not present

## 2023-12-02 DIAGNOSIS — G8220 Paraplegia, unspecified: Secondary | ICD-10-CM | POA: Diagnosis not present

## 2023-12-02 DIAGNOSIS — N3000 Acute cystitis without hematuria: Secondary | ICD-10-CM | POA: Diagnosis not present

## 2023-12-02 DIAGNOSIS — D649 Anemia, unspecified: Secondary | ICD-10-CM | POA: Diagnosis not present

## 2023-12-02 DIAGNOSIS — Z7189 Other specified counseling: Secondary | ICD-10-CM | POA: Diagnosis not present

## 2023-12-02 DIAGNOSIS — F4310 Post-traumatic stress disorder, unspecified: Secondary | ICD-10-CM | POA: Diagnosis not present

## 2023-12-02 LAB — BLOOD CULTURE ID PANEL (REFLEXED) - BCID2

## 2023-12-02 NOTE — TOC Transition Note (Signed)
 Transition of Care [facility]) - Discharge Note   Patient Details  Name: Luke Velasquez MRN: [id number] Date of Birth: 2001/03/31  [facility] CM/SW Contact:  Gertha Ku, LCSW Phone Number: 12/02/2023, 12:17 PM   Clinical Narrative:    Pt to d/c back home with [facility] hospice.  CSW spoke with pt's mother Lucia Russian to verify pt's address. Pt's mother reports she did not need hospital bed.  [facility] has been called. TOC sign off.     Final next level of care: Home w Hospice Care Barriers to Discharge: Barriers Resolved   Patient Goals and CMS Choice Patient states their goals for this hospitalization and ongoing recovery are:: retrun home          Discharge Placement                  Name of family member notified: Gale Jude (Mother)  [phone] (Mobile) Patient and family notified of of transfer: 12/02/23  Discharge Plan and Services Additional resources added to the After Visit Summary for                                       Social Drivers of Health (SDOH) Interventions SDOH Screenings   Food Insecurity: Food Insecurity Present (12/01/2023)  Housing: Low Risk  (12/01/2023)  Transportation Needs: No Transportation Needs (12/01/2023)  Utilities: Not At Risk (12/01/2023)  Depression (PHQ2-9): Low Risk  (06/27/2020)  Recent Concern: Depression (PHQ2-9) - Medium Risk (05/16/2020)  Tobacco Use: High Risk (12/01/2023)     Readmission Risk Interventions     No data to display

## 2023-12-02 NOTE — Consult Note (Signed)
 Psychiatric Consult  Date: 12/02/23 Patient: Luke Velasquez Consult Reason: Evaluation of psychiatric symptoms and support for family concerns  Summary of Interaction:  Today, a therapeutic conversation was held with the patient's mother(tamirah) regarding her ongoing concerns about the patient's mental health status. She reported a noticeable increase in both auditory hallucinations and paranoia. The patient has been responding to both internal and external stimuli, and his behavior at home has become increasingly erratic.  She expressed concern over his continued noncompliance with prescribed medications, noting that despite his current agreement to receive IV antibiotics while hospitalized, she fears he will discontinue all treatment once discharged. She also noted that he frequently calls her at work stating, "I am scared. Can you come home? I'm not sure who is here," indicating heightened paranoia and fearfulness.  The patient is reportedly experiencing significant insomnia, but continues to refuse his prescribed trazodone . She further expressed concerns about episodes of agitation, although she acknowledged that he is no longer physically aggressive. Prior to his Foley catheter placement, he would often lie in his own urine, contributing to concerns about self-care and overall well-being.  During the discussion, therapeutic listening, validation, and emotional support were provided. Coaching was offered to help her explore coping strategies in response to her son's deteriorating mental health and behavioral challenges.  The topic of the patient's decision-making capacity and the possibility of non-emergency, hospital-based forced medication for acute stabilization was introduced. It was explained that this intervention is only available during inpatient hospitalization and not applicable once the patient returns home. The mother was able to verbalize her opposition to such measures, stating,  "That is not something I want for my son. He has already been through enough traumatic experiences."  Further supportive listening was provided as she began to express a shift in perspective toward comfort-focused care. She shared a deep emotional burden, stating, "I am tired of seeing him go through this. This is enough suffering for him and myself. It's not fair to him or my other children. He takes his brief off and has feces everywhere. He throws his food tray at the wall and fights with people who aren't there."  Given the depth of her concerns and the emotional toll described, she expressed a desire to transition the patient to comfort care and bring him home. Her wishes were acknowledged with empathy and reassurance, and she was supported in exploring the implications of this decision.  Plan: *Communicate mother's request and shift toward comfort care to the primary medical team. *Psychiatric consult to be discontinued as per current clinical direction. *Encourage continued family support and engagement with palliative care team if appropriate. *Recommend ongoing emotional support and counseling resources for caregiver coping.

## 2023-12-02 NOTE — Progress Notes (Signed)
 Patient refused skin assessment and refused to be cleaned up and foley care after educating the importance of cleansers. Patient won't let RN empty his colostomy bag. Patient stated "I don't need it to be emptied'. Charge nurse and NP aware.

## 2023-12-02 NOTE — Discharge Summary (Signed)
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   Collection Time: 12/01/23  3:55 AM   Specimen: Urine, Random  Result Value Ref Range Status   Specimen Description   Final    URINE, RANDOM Performed at [facility], [address], [place], [place] [address]    Special Requests   Final    NONE Reflexed from [id number] Performed at [facility], [address] [place], [place] [address]    Culture >=100,000 COLONIES/mL ESCHERICHIA COLI (A)  Final   Report Status PENDING  Incomplete  Resp panel by RT-PCR  (RSV, Flu A&B, Covid) Anterior Nasal Swab     Status: None   Collection Time: 12/01/23  4:13 AM   Specimen: Anterior Nasal Swab  Result Value Ref Range Status   SARS Coronavirus 2 by RT PCR NEGATIVE NEGATIVE Final    Comment: (NOTE) SARS-CoV-2 target nucleic acids are NOT DETECTED.  The SARS-CoV-2 RNA is generally detectable in upper respiratory specimens during the acute phase of infection. The lowest concentration of SARS-CoV-2 viral copies this assay can detect is 138 copies/mL. A negative result does not preclude SARS-Cov-2 infection and should not be used as the sole basis for treatment or other patient management decisions. A negative result may occur with  improper specimen collection/handling, submission of specimen other than nasopharyngeal swab, presence of viral mutation(s) within the areas targeted by this assay, and inadequate number of viral copies(<138 copies/mL). A negative result must be combined with clinical observations, patient history, and epidemiological information. The expected result is Negative.  Fact Sheet for Patients:  BloggerCourse.com  Fact Sheet for Healthcare Providers:  SeriousBroker.it  This test is no t yet approved or cleared by the [place]  FDA and  has been authorized for detection and/or diagnosis of SARS-CoV-2 by FDA under an Emergency Use Authorization (EUA). This EUA will remain  in effect (meaning this test can be used) for the duration of the COVID-19 declaration under Section 564(b)(1) of the Act, 21 U.S.C.section 360bbb-3(b)(1), unless the authorization is terminated  or revoked sooner.       Influenza A by PCR NEGATIVE NEGATIVE Final   Influenza B by PCR NEGATIVE NEGATIVE Final    Comment: (NOTE) The Xpert Xpress SARS-CoV-2/FLU/RSV plus assay is intended as an aid in the diagnosis of influenza from Nasopharyngeal swab specimens and should not be used as a sole basis for  treatment. Nasal washings and aspirates are unacceptable for Xpert Xpress SARS-CoV-2/FLU/RSV testing.  Fact Sheet for Patients: BloggerCourse.com  Fact Sheet for Healthcare Providers: SeriousBroker.it  This test is not yet approved or cleared by the [place]  FDA and has been authorized for detection and/or diagnosis of SARS-CoV-2 by FDA under an Emergency Use Authorization (EUA). This EUA will remain in effect (meaning this test can be used) for the duration of the COVID-19 declaration under Section 564(b)(1) of the Act, 21 U.S.C. section 360bbb-3(b)(1), unless the authorization is terminated or revoked.     Resp Syncytial Virus by PCR NEGATIVE NEGATIVE Final    Comment: (NOTE) Fact Sheet for Patients: BloggerCourse.com  Fact Sheet for Healthcare Providers: SeriousBroker.it  This test is not yet approved or cleared by the [place]  FDA and has been authorized for detection and/or diagnosis of SARS-CoV-2 by FDA under an Emergency Use Authorization (EUA). This EUA will remain in effect (meaning this test can be used) for the duration of the COVID-19 declaration under Section 564(b)(1) of the Act, 21 U.S.C. section 360bbb-3(b)(1), unless the authorization is terminated or revoked.  Performed at [facility], [address], [place], [place] [address]   Blood Culture (routine x 2)     Status: None (Preliminary result)   Collection Time: 12/01/23  4:16 AM   Specimen: BLOOD  Result Value Ref Range Status   Specimen Description   Final    BLOOD LEFT ANTECUBITAL Performed at [facility], [address] [place], [place] [address]    Special Requests   Final    BOTTLES DRAWN AEROBIC AND ANAEROBIC Blood Culture adequate volume Performed at [facility], [address], [place], [place] [address]    Culture  Setup  Time   Final    GRAM POSITIVE COCCI IN CLUSTERS AEROBIC BOTTLE ONLY CRITICAL RESULT CALLED TO, READ BACK BY AND VERIFIED WITH: PHARMD E.Wachs 12/02/2023 @0322  BY AB. Performed at [facility], [address], [place], [place] [address]    Culture GRAM POSITIVE COCCI  Final   Report Status PENDING  Incomplete  Blood Culture ID Panel (Reflexed)     Status: Abnormal   Collection Time: 12/01/23  4:16 AM  Result Value Ref Range Status   Enterococcus faecalis NOT DETECTED NOT DETECTED Final   Enterococcus Faecium NOT DETECTED NOT DETECTED Final   Listeria monocytogenes NOT DETECTED NOT DETECTED Final   Staphylococcus species DETECTED (A) NOT DETECTED Final    Comment: CRITICAL RESULT CALLED TO, READ BACK BY AND VERIFIED WITH: PHARMD E Kuper 12/02/2023 @ 0322 BY AB    Staphylococcus aureus (BCID) NOT DETECTED NOT DETECTED Final   Staphylococcus epidermidis NOT DETECTED NOT DETECTED Final   Staphylococcus lugdunensis NOT DETECTED NOT DETECTED Final   Streptococcus species NOT DETECTED NOT DETECTED Final   Streptococcus agalactiae NOT DETECTED NOT DETECTED Final   Streptococcus pneumoniae NOT DETECTED NOT DETECTED Final   Streptococcus pyogenes NOT DETECTED NOT DETECTED Final   A.calcoaceticus-baumannii NOT DETECTED NOT DETECTED Final   Bacteroides fragilis NOT DETECTED NOT DETECTED Final   Enterobacterales NOT DETECTED NOT DETECTED Final   Enterobacter cloacae complex NOT DETECTED NOT DETECTED Final   Escherichia coli NOT DETECTED NOT DETECTED Final   Klebsiella aerogenes NOT DETECTED NOT DETECTED Final   Klebsiella oxytoca NOT DETECTED NOT DETECTED Final   Klebsiella pneumoniae NOT DETECTED NOT DETECTED Final   Proteus species NOT DETECTED NOT DETECTED Final   Salmonella species NOT DETECTED NOT DETECTED Final   Serratia marcescens NOT DETECTED NOT DETECTED Final   Haemophilus influenzae NOT DETECTED NOT DETECTED Final   Neisseria meningitidis NOT DETECTED NOT DETECTED Final    Pseudomonas aeruginosa NOT DETECTED NOT DETECTED Final   Stenotrophomonas maltophilia NOT DETECTED NOT DETECTED Final   Candida albicans NOT DETECTED NOT DETECTED Final   Candida auris NOT DETECTED NOT DETECTED Final   Candida glabrata NOT DETECTED NOT DETECTED Final   Candida krusei NOT DETECTED NOT DETECTED Final   Candida parapsilosis NOT DETECTED NOT DETECTED Final   Candida tropicalis NOT DETECTED NOT DETECTED Final   Cryptococcus neoformans/gattii NOT DETECTED NOT DETECTED Final    Comment: Performed at [facility], [address] [place], [place] [address]  Blood Culture (routine x 2)     Status: None (Preliminary result)   Collection Time: 12/01/23  8:54 AM   Specimen: BLOOD RIGHT FOREARM  Result Value Ref Range Status   Specimen Description   Final    BLOOD RIGHT FOREARM Performed at [facility], [address], [place], [place] [address]    Special  Requests   Final    BOTTLES DRAWN AEROBIC AND ANAEROBIC Blood Culture results may not be optimal due to an inadequate volume of blood received in culture bottles Performed at [facility], [address], [place], [place] [address]    Culture   Final    NO GROWTH < 24 HOURS Performed at [facility], [address], [place], [place] [address]    Report Status PENDING  Incomplete     Patient was seen and examined on the day of discharge and was found to be in stable condition. Time coordinating discharge: 45 minutes including assessment and coordination of care, as well as examination of the patient.   SIGNED:  Daren Eck, DO [facility] 12/02/2023, 11:02 AM
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
## 2023-12-02 NOTE — Progress Notes (Signed)
 PHARMACY - PHYSICIAN COMMUNICATION CRITICAL VALUE ALERT - BLOOD CULTURE IDENTIFICATION (BCID)  Luke Velasquez is an 23 y.o. male who presented to [facility] on 12/01/2023 with a chief complaint of hallucinations and manic symptoms  Assessment:  1/4 staph species  Name of physician (or Provider) ContactedLorre Rosin  Current antibiotics: CTX  Changes to prescribed antibiotics recommended:  none  No results found for this or any previous visit.  [name] RPh 12/02/2023, 3:16 AM

## 2023-12-03 LAB — URINE CULTURE: Culture: >=100000 — AB

## 2023-12-03 LAB — CULTURE, BLOOD (ROUTINE X 2): Special Requests: ADEQUATE

## 2023-12-06 LAB — CULTURE, BLOOD (ROUTINE X 2): Culture: NO GROWTH

## 2023-12-13 ENCOUNTER — Emergency Department (HOSPITAL_COMMUNITY)

## 2023-12-13 ENCOUNTER — Encounter (HOSPITAL_COMMUNITY): Payer: Self-pay | Admitting: Emergency Medicine

## 2023-12-13 ENCOUNTER — Inpatient Hospital Stay (HOSPITAL_COMMUNITY)
Admission: EM | Admit: 2023-12-13 | Source: Home / Self Care | Attending: Internal Medicine | Admitting: Internal Medicine

## 2023-12-13 ENCOUNTER — Other Ambulatory Visit: Payer: Self-pay

## 2023-12-13 ENCOUNTER — Inpatient Hospital Stay (HOSPITAL_COMMUNITY)

## 2023-12-13 DIAGNOSIS — N319 Neuromuscular dysfunction of bladder, unspecified: Secondary | ICD-10-CM | POA: Diagnosis present

## 2023-12-13 DIAGNOSIS — N390 Urinary tract infection, site not specified: Secondary | ICD-10-CM

## 2023-12-13 DIAGNOSIS — L89159 Pressure ulcer of sacral region, unspecified stage: Secondary | ICD-10-CM | POA: Diagnosis present

## 2023-12-13 DIAGNOSIS — D649 Anemia, unspecified: Secondary | ICD-10-CM | POA: Diagnosis not present

## 2023-12-13 DIAGNOSIS — E46 Unspecified protein-calorie malnutrition: Secondary | ICD-10-CM

## 2023-12-13 DIAGNOSIS — Z86718 Personal history of other venous thrombosis and embolism: Secondary | ICD-10-CM

## 2023-12-13 DIAGNOSIS — A419 Sepsis, unspecified organism: Principal | ICD-10-CM | POA: Diagnosis present

## 2023-12-13 DIAGNOSIS — E871 Hypo-osmolality and hyponatremia: Secondary | ICD-10-CM

## 2023-12-13 DIAGNOSIS — Z993 Dependence on wheelchair: Secondary | ICD-10-CM

## 2023-12-13 DIAGNOSIS — F319 Bipolar disorder, unspecified: Secondary | ICD-10-CM | POA: Diagnosis present

## 2023-12-13 DIAGNOSIS — Z933 Colostomy status: Secondary | ICD-10-CM

## 2023-12-13 DIAGNOSIS — E876 Hypokalemia: Secondary | ICD-10-CM

## 2023-12-13 DIAGNOSIS — G8220 Paraplegia, unspecified: Secondary | ICD-10-CM | POA: Diagnosis present

## 2023-12-13 DIAGNOSIS — L8993 Pressure ulcer of unspecified site, stage 3: Secondary | ICD-10-CM

## 2023-12-13 DIAGNOSIS — Z515 Encounter for palliative care: Secondary | ICD-10-CM

## 2023-12-13 DIAGNOSIS — E43 Unspecified severe protein-calorie malnutrition: Secondary | ICD-10-CM | POA: Diagnosis present

## 2023-12-13 DIAGNOSIS — R739 Hyperglycemia, unspecified: Secondary | ICD-10-CM

## 2023-12-13 DIAGNOSIS — D75839 Thrombocytosis, unspecified: Secondary | ICD-10-CM

## 2023-12-13 DIAGNOSIS — R748 Abnormal levels of other serum enzymes: Secondary | ICD-10-CM

## 2023-12-13 LAB — CBC WITH DIFFERENTIAL/PLATELET
Abs Immature Granulocytes: 0.06 10*3/uL (ref 0.00–0.07)
Basophils Absolute: 0 10*3/uL (ref 0.0–0.1)
Basophils Relative: 0 %
Eosinophils Absolute: 0.1 10*3/uL (ref 0.0–0.5)
Eosinophils Relative: 1 %
HCT: 20.4 % — ABNORMAL LOW (ref 39.0–52.0)
Hemoglobin: 5.7 g/dL — CL (ref 13.0–17.0)
Immature Granulocytes: 1 %
Lymphocytes Relative: 17 %
Lymphs Abs: 1.6 10*3/uL (ref 0.7–4.0)
MCH: 23 pg — ABNORMAL LOW (ref 26.0–34.0)
MCHC: 27.9 g/dL — ABNORMAL LOW (ref 30.0–36.0)
MCV: 82.3 fL (ref 80.0–100.0)
Monocytes Absolute: 1 10*3/uL (ref 0.1–1.0)
Monocytes Relative: 11 %
Neutro Abs: 6.3 10*3/uL (ref 1.7–7.7)
Neutrophils Relative %: 70 %
Platelets: 675 10*3/uL — ABNORMAL HIGH (ref 150–400)
RBC: 2.48 MIL/uL — ABNORMAL LOW (ref 4.22–5.81)
RDW: 22.7 % — ABNORMAL HIGH (ref 11.5–15.5)
WBC: 9.1 10*3/uL (ref 4.0–10.5)
nRBC: 0 % (ref 0.0–0.2)

## 2023-12-13 LAB — URINALYSIS, W/ REFLEX TO CULTURE (INFECTION SUSPECTED)
Bilirubin Urine: NEGATIVE
Glucose, UA: NEGATIVE mg/dL
Ketones, ur: NEGATIVE mg/dL
Nitrite: NEGATIVE
Protein, ur: NEGATIVE mg/dL
Specific Gravity, Urine: 1.006 (ref 1.005–1.030)
pH: 6 (ref 5.0–8.0)

## 2023-12-13 LAB — CBC
HCT: 23.8 % — ABNORMAL LOW (ref 39.0–52.0)
Hemoglobin: 6.7 g/dL — CL (ref 13.0–17.0)
MCH: 23.2 pg — ABNORMAL LOW (ref 26.0–34.0)
MCHC: 28.2 g/dL — ABNORMAL LOW (ref 30.0–36.0)
MCV: 82.4 fL (ref 80.0–100.0)
Platelets: 697 10*3/uL — ABNORMAL HIGH (ref 150–400)
RBC: 2.89 MIL/uL — ABNORMAL LOW (ref 4.22–5.81)
RDW: 23 % — ABNORMAL HIGH (ref 11.5–15.5)
WBC: 9 10*3/uL (ref 4.0–10.5)
nRBC: 0 % (ref 0.0–0.2)

## 2023-12-13 LAB — COMPREHENSIVE METABOLIC PANEL WITH GFR
ALT: 18 U/L (ref 0–44)
AST: 18 U/L (ref 15–41)
Albumin: 1.6 g/dL — ABNORMAL LOW (ref 3.5–5.0)
Alkaline Phosphatase: 155 U/L — ABNORMAL HIGH (ref 38–126)
Anion gap: 9 (ref 5–15)
BUN: 7 mg/dL (ref 6–20)
CO2: 22 mmol/L (ref 22–32)
Calcium: 7.8 mg/dL — ABNORMAL LOW (ref 8.9–10.3)
Chloride: 101 mmol/L (ref 98–111)
Creatinine, Ser: 0.68 mg/dL (ref 0.61–1.24)
GFR, Estimated: >60 mL/min (ref >60)
Glucose, Bld: 103 mg/dL — ABNORMAL HIGH (ref 70–99)
Potassium: 3.4 mmol/L — ABNORMAL LOW (ref 3.5–5.1)
Sodium: 132 mmol/L — ABNORMAL LOW (ref 135–145)
Total Bilirubin: 0.5 mg/dL (ref 0.0–1.2)
Total Protein: 6.1 g/dL — ABNORMAL LOW (ref 6.5–8.1)

## 2023-12-13 LAB — C-REACTIVE PROTEIN: CRP: 16.7 mg/dL — ABNORMAL HIGH (ref <1.0)

## 2023-12-13 LAB — PROTIME-INR
INR: 1.3 — ABNORMAL HIGH (ref 0.8–1.2)
Prothrombin Time: 16.6 s — ABNORMAL HIGH (ref 11.4–15.2)

## 2023-12-13 LAB — I-STAT CG4 LACTIC ACID, ED: Lactic Acid, Venous: 1.3 mmol/L (ref 0.5–1.9)

## 2023-12-13 LAB — SEDIMENTATION RATE: Sed Rate: 126 mm/h — ABNORMAL HIGH (ref 0–16)

## 2023-12-13 MED ORDER — VANCOMYCIN HCL IN DEXTROSE 1-5 GM/200ML-% IV SOLN
1000.0000 mg | Freq: Two times a day (BID) | INTRAVENOUS | Status: DC
  Administered 2023-12-13 – 2023-12-14 (×2): 1000 mg via INTRAVENOUS
  Filled 2023-12-13 (×2): qty 200

## 2023-12-13 MED ORDER — OXYCODONE HCL 5 MG PO TABS
5.0000 mg | ORAL_TABLET | ORAL | Status: DC | PRN
  Administered 2023-12-16 – 2023-12-25 (×5): 5 mg via ORAL
  Filled 2023-12-13 (×5): qty 1

## 2023-12-13 MED ORDER — METRONIDAZOLE 500 MG/100ML IV SOLN
500.0000 mg | Freq: Once | INTRAVENOUS | Status: AC
  Administered 2023-12-13: 500 mg via INTRAVENOUS
  Filled 2023-12-13: qty 100

## 2023-12-13 MED ORDER — LORAZEPAM 0.5 MG PO TABS
0.5000 mg | ORAL_TABLET | Freq: Two times a day (BID) | ORAL | Status: DC
  Administered 2023-12-13 – 2024-03-17 (×138): 0.5 mg via ORAL
  Filled 2023-12-13 (×166): qty 1

## 2023-12-13 MED ORDER — SODIUM CHLORIDE 0.9 % IV SOLN
2.0000 g | Freq: Three times a day (TID) | INTRAVENOUS | Status: DC
  Administered 2023-12-13 – 2023-12-14 (×3): 2 g via INTRAVENOUS
  Filled 2023-12-13 (×3): qty 12.5

## 2023-12-13 MED ORDER — CHLORHEXIDINE GLUCONATE CLOTH 2 % EX PADS
6.0000 | MEDICATED_PAD | Freq: Every day | CUTANEOUS | Status: DC
Start: 2023-12-13 — End: 2024-03-05
  Administered 2023-12-13 – 2024-02-24 (×36): 6 via TOPICAL

## 2023-12-13 MED ORDER — LACTATED RINGERS IV SOLN: INTRAVENOUS | Status: AC

## 2023-12-13 MED ORDER — LACTATED RINGERS IV BOLUS (SEPSIS)
1000.0000 mL | Freq: Once | INTRAVENOUS | Status: AC
  Administered 2023-12-13: 1000 mL via INTRAVENOUS

## 2023-12-13 MED ORDER — VANCOMYCIN HCL IN DEXTROSE 1-5 GM/200ML-% IV SOLN: 1000.0000 mg | Freq: Once | INTRAVENOUS | Status: DC

## 2023-12-13 MED ORDER — GADOBUTROL 1 MMOL/ML IV SOLN
6.0000 mL | Freq: Once | INTRAVENOUS | Status: AC | PRN
  Administered 2023-12-13: 6 mL via INTRAVENOUS

## 2023-12-13 MED ORDER — ENOXAPARIN SODIUM 40 MG/0.4ML IJ SOSY: 40.0000 mg | PREFILLED_SYRINGE | INTRAMUSCULAR | Status: DC

## 2023-12-13 MED ORDER — VANCOMYCIN HCL 1.25 G IV SOLR
1250.0000 mg | Freq: Once | INTRAVENOUS | Status: AC
  Administered 2023-12-13: 1250 mg via INTRAVENOUS
  Filled 2023-12-13: qty 25

## 2023-12-13 MED ORDER — SODIUM CHLORIDE 0.9 % IV SOLN
2.0000 g | Freq: Once | INTRAVENOUS | Status: AC
  Administered 2023-12-13: 2 g via INTRAVENOUS
  Filled 2023-12-13: qty 12.5

## 2023-12-13 MED ORDER — ACETAMINOPHEN 325 MG PO TABS: 650.0000 mg | ORAL_TABLET | Freq: Once | ORAL | Status: DC

## 2023-12-13 MED ORDER — METRONIDAZOLE 500 MG PO TABS
500.0000 mg | ORAL_TABLET | Freq: Two times a day (BID) | ORAL | Status: DC
  Administered 2023-12-13 (×2): 500 mg via ORAL
  Filled 2023-12-13 (×2): qty 1

## 2023-12-13 MED ORDER — ACETAMINOPHEN 325 MG PO TABS: 650.0000 mg | ORAL_TABLET | Freq: Four times a day (QID) | ORAL | Status: DC | PRN
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
## 2023-12-13 NOTE — ED Notes (Signed)
C-collar removed by Dr Glick. 

## 2023-12-13 NOTE — ED Notes (Signed)
 Patient transported to MRI

## 2023-12-13 NOTE — Consult Note (Addendum)
 WOC Nurse Consult Note: Consult requested for multiple wounds. Performed remotely after review of progress notes and photos in the EMR.  Pt is familiar to [facility] team from recent admission on 5/5.  He has an ostomy which he has been changing and emptying prior to admission and it has been in place for several years.  He uses a convex Radiographer, therapeutic which we do not carry, so I will order a similar product from the [employer] formulary. Supplies ordered as follows: barrier rings # [id number] and convex pouch Lawson # [id number]  Wound type: chronic nonhealing pressure injuries, chronic full thickness wounds, chronic osteomyelitis.  Pressure Injury POA: Yes   Measurement: Sacrum:  chronic Stage 4 red and moist Left sacral area stage 3 50% red, 50% black Left trochanter stage 3 50% yellow, 50% red and moist Left lateral leg:  4 areas of red moist full thickness wounds Dressing procedure/placement/frequency:  Topical treatment orders provided for bedside nurses to perform as follows: Cleanse wounds to sacrum/hip, buttocks and legs with VASHE (LAWSON # [id number]) and pat dry.  Apply Aquacel (LAWSON # [id number]) to open wounds  Cover with gauze and ABD pads/tape.  Change Monday/Wednesday Friday and PRN soilage.  Low air loss mattress when transferred to the floor.  Please re-consult if further assistance is needed.  Thank-you,  Wiliam Harder MSN, RN, CWOCN, [place], CNS [phone]

## 2023-12-13 NOTE — Progress Notes (Signed)
 Pharmacy Antibiotic Note  Luke Velasquez is a 23 y.o. male admitted on 12/13/2023 with multiple wounds and chronic osteomyelitis. Pharmacy has been consulted for vancomycin  dosing.  Plan: Vancomycin  1000mg  q12h (eAUC 497, Scr used 0.8)  F/u renal function, infectious work up and length of therapy Vancomycin  levels as needed  Height: 6\' 4"  (193 cm) Weight: 56 kg (123 lb 7.3 oz) IBW/kg (Calculated) : 86.8  Temp (24hrs), Avg:97.4 F (36.3 C), Min:96.2 F (35.7 C), Max:102.1 F (38.9 C)  Recent Labs  Lab 12/13/23 0252 12/13/23 0258  WBC 9.1  --   CREATININE 0.68  --   LATICACIDVEN  --  1.3    Estimated Creatinine Clearance: 114.7 mL/min (by C-G formula based on SCr of 0.68 mg/dL).    Allergies  Allergen Reactions   Ivp Dye [Iodinated Contrast Media] Swelling    Eye itching and swelling    Antimicrobials this admission: Vancomycin  5/27 > Flagyl  5/27 > Cefepime  5/27 >  Microbiology results: 5/27 bcx: 5/27 wound cx (R hip): 5/27 wound cx (L hip): 5/27 wound cx (leg): 5/27 Ucx:  Thank you for allowing pharmacy to be a part of this patient's care.  Fonda Hymen 12/13/2023 11:19 AM

## 2023-12-13 NOTE — ED Notes (Signed)
 Radiology reports bone scan for 12/14/23 in the morning, no prep required.

## 2023-12-13 NOTE — ED Notes (Addendum)
Dr. Moore at bedside.
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
## 2023-12-13 NOTE — ED Notes (Signed)
 Wound culture sent from sacral wound to lab.

## 2023-12-13 NOTE — ED Notes (Signed)
 Pt resting with eyes closed; respirations spontaneous, even, unlabored
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
## 2023-12-13 NOTE — ED Notes (Signed)
 Pt returned from CT scan.

## 2023-12-13 NOTE — Sepsis Progress Note (Signed)
 Elink monitoring for the code sepsis protocol.

## 2023-12-14 DIAGNOSIS — L8993 Pressure ulcer of unspecified site, stage 3: Secondary | ICD-10-CM

## 2023-12-14 DIAGNOSIS — D75839 Thrombocytosis, unspecified: Secondary | ICD-10-CM

## 2023-12-14 DIAGNOSIS — D649 Anemia, unspecified: Secondary | ICD-10-CM | POA: Diagnosis not present

## 2023-12-14 MED ORDER — SODIUM CHLORIDE 0.9 % IV SOLN: 2.0000 g | INTRAVENOUS | Status: DC

## 2023-12-14 MED ORDER — SODIUM CHLORIDE 0.9 % IV SOLN
2.0000 g | Freq: Two times a day (BID) | INTRAVENOUS | Status: DC
  Administered 2023-12-14 – 2023-12-16 (×5): 2 g via INTRAVENOUS
  Filled 2023-12-14 (×5): qty 12.5

## 2023-12-14 MED ORDER — LINEZOLID 600 MG PO TABS
600.0000 mg | ORAL_TABLET | Freq: Two times a day (BID) | ORAL | Status: DC
  Administered 2023-12-14 – 2023-12-15 (×3): 600 mg via ORAL
  Filled 2023-12-14 (×3): qty 1

## 2023-12-14 NOTE — Plan of Care (Signed)
  Problem: Education: Goal: Knowledge of General Education information will improve Description: Including pain rating scale, medication(s)/side effects and non-pharmacologic comfort measures 12/14/2023 1510 by Corlis Dienes, RN Outcome: Progressing 12/14/2023 1325 by Corlis Dienes, RN Outcome: Progressing   Problem: Health Behavior/Discharge Planning: Goal: Ability to manage health-related needs will improve Outcome: Progressing

## 2023-12-14 NOTE — TOC Initial Note (Signed)
 Transition of Care [facility]) - Initial/Assessment Note    Patient Details  Name: Luke Velasquez MRN: [id number] Date of Birth: 03-02-2001  Transition of Care [facility]) CM/SW Contact:    Jannice Mends, LCSW Phone Number: 12/14/2023, 2:27 PM  Clinical Narrative:                 CSW received request from patient and mother for SNF placement. Barriers to placement include need for long term care [facility] Medicaid bed and patient's age. Will send out referrals to see if anyone can accept patient. At that point, will see if patient is eligible for any skilled care with mother's commercial insurance and if so, can revoke Hospice. If not, patient can be placed with Hospice under his Medicaid.   Expected Discharge Plan: Skilled Nursing Facility Barriers to Discharge: Continued Medical Work up, English as a second language teacher, SNF Pending bed offer   Patient Goals and CMS Choice Patient states their goals for this hospitalization and ongoing recovery are:: SNF placement CMS Medicare.gov Compare Post Acute Care list provided to:: Patient Choice offered to / list presented to : Patient [place] ownership interest in [facility].provided to:: Patient    Expected Discharge Plan and Services In-house Referral: Clinical Social Work   Post Acute Care Choice: Skilled Nursing Facility Living arrangements for the past 2 months: Single Family Home                                      Prior Living Arrangements/Services Living arrangements for the past 2 months: Single Family Home Lives with:: Parents Patient language and need for interpreter reviewed:: Yes Do you feel safe going back to the place where you live?: Yes      Need for Family Participation in Patient Care: Yes (Comment) Care giver support system in place?: Yes (comment) Current home services: DME Criminal Activity/Legal Involvement Pertinent to Current Situation/Hospitalization: No - Comment as needed  Activities of Daily  Living      Permission Sought/Granted Permission sought to share information with : Facility Medical sales representative, Family Supports Permission granted to share information with : Yes, Verbal Permission Granted  Share Information with NAME: Lucia Russian  Permission granted to share info w AGENCY: SNFs  Permission granted to share info w Relationship: Mother  Permission granted to share info w Contact Information: [phone]  Emotional Assessment Appearance:: Appears stated age Attitude/Demeanor/Rapport: Engaged Affect (typically observed): Quiet Orientation: : Oriented to Self, Oriented to Place, Oriented to  Time, Oriented to Situation Alcohol  / Substance Use: Not Applicable Psych Involvement: No (comment)  Admission diagnosis:  Hypokalemia [E87.6] Hyponatremia [E87.1] Normocytic anemia [D64.9] Thrombocytosis [D75.839] Elevated random blood glucose level [R73.9] Elevated alkaline phosphatase level [R74.8] Symptomatic anemia [D64.9] Sepsis due to undetermined organism (HCC) [A41.9] Urinary tract infection without hematuria, site unspecified [N39.0] Hypoalbuminemia due to protein-calorie malnutrition (HCC) [E88.09, E46] Pressure injury, stage 3, unspecified location [facility]) [L89.93] Patient Active Problem List   Diagnosis Date Noted   Malnutrition of moderate degree 11/22/2023   Sepsis due to cellulitis (HCC) 11/21/2023   Symptomatic anemia 11/21/2023   Lactic acidosis 11/21/2023   Bipolar disorder (HCC) 11/21/2023   Impaired mobility 11/21/2023   Insomnia due to medical condition 06/25/2023   Sepsis secondary to UTI (HCC) 06/24/2023   Sacral decubitus ulcer 06/24/2023   Chronic osteomyelitis of pelvic region (HCC) 06/24/2023   Acute blood loss anemia 06/24/2023   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   Thrombocytosis 06/24/2023   Involuntary commitment 06/24/2023   Pressure injury of skin 02/14/2023   Protein-calorie malnutrition, severe (HCC) 02/14/2023   UTI  (urinary tract infection) 02/14/2023   Drainage from wound 02/12/2023   PTSD (post-traumatic stress disorder) 12/07/2022   Psychosis (HCC) 08/06/2022   Polysubstance abuse (HCC) 08/06/2022   Impaired mobility and ADLs 03/10/2022   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 AT CORNER OF [facility] [address] [place]  [address] Phone: [phone] Fax: [phone]  [name] [address] [place] [address] Phone: [phone] Fax: [phone]  [facility] - [facility] [address] [place] [address] Phone: [phone] Fax: [phone]     Social Drivers of Health (SDOH) Social History: SDOH Screenings   Food Insecurity: Food Insecurity Present (12/01/2023)  Housing: Low Risk  (12/01/2023)  Transportation Needs: No Transportation Needs (12/01/2023)  Utilities: Not At Risk (12/01/2023)  Depression (PHQ2-9): Low Risk  (06/27/2020)  Recent Concern: Depression (PHQ2-9) - Medium Risk (05/16/2020)  Tobacco Use: High Risk (12/13/2023)   SDOH Interventions:     Readmission Risk Interventions    12/14/2023    2:24 PM  Readmission Risk Prevention Plan  Transportation Screening Complete  Medication Review (RN Care Manager) Complete  PCP or Specialist appointment within 3-5 days of discharge Complete  HRI or Home Care Consult Complete  SW Recovery Care/Counseling Consult Complete  Palliative Care Screening Not Applicable  Skilled Nursing Facility Complete

## 2023-12-14 NOTE — [place] FL2 (Addendum)
 [place]  MEDICAID FL2 LEVEL OF CARE FORM     IDENTIFICATION  Patient Name: Luke Velasquez Birthdate: 2001/04/28 Sex: male Admission Date (Current Location): 12/13/2023  [facility] and [place] Number:  Producer, television/film/video and Address:  [facility], [address] [place], [place] [address]      Provider Number: [id number]  Attending Physician Name and Address:  Burton Casey, MD  Relative Name and Phone Number:       Current Level of Care: Hospital Recommended Level of Care: Skilled Nursing Facility Prior Approval Number:  [id number]  Date Approved/Denied:   PASRR Number: [id number]  Discharge Plan: SNF    Current Diagnoses: Patient Active Problem List   Diagnosis Date Noted   Malnutrition of moderate degree 11/22/2023   Sepsis due to cellulitis (HCC) 11/21/2023   Symptomatic anemia 11/21/2023   Lactic acidosis 11/21/2023   Bipolar disorder (HCC) 11/21/2023   Impaired mobility 11/21/2023   Insomnia due to medical condition 06/25/2023   Sepsis secondary to UTI (HCC) 06/24/2023   Sacral decubitus ulcer 06/24/2023   Chronic osteomyelitis of pelvic region (HCC) 06/24/2023   Acute blood loss anemia 06/24/2023   History of DVT (deep vein thrombosis) 06/24/2023   Thrombocytosis 06/24/2023   Involuntary commitment 06/24/2023   Pressure injury of skin 02/14/2023   Protein-calorie malnutrition, severe (HCC) 02/14/2023   UTI (urinary tract infection) 02/14/2023   Drainage from wound 02/12/2023   PTSD (post-traumatic stress disorder) 12/07/2022   Psychosis (HCC) 08/06/2022   Polysubstance abuse (HCC) 08/06/2022   Impaired mobility and ADLs 03/10/2022   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   Paraplegia (HCC) 05/16/2020   Spasticity 05/16/2020   Neurogenic bladder 05/16/2020   Wheelchair dependence 05/16/2020    Orientation RESPIRATION BLADDER Height & Weight     Self, Time, Situation, Place  Normal Continent, Indwelling catheter Weight:  123 lb 7.3 oz (56 kg) Height:  6\' 4"  (193 cm)  BEHAVIORAL SYMPTOMS/MOOD NEUROLOGICAL BOWEL NUTRITION STATUS      Continent, Colostomy Diet (See dc summary)  AMBULATORY STATUS COMMUNICATION OF NEEDS Skin   Total Care Verbally PU Stage and Appropriate Care (Stage II on hip,leg, abdomen, ankle,heel ; Stage III on buttocks and sacrum; Stage IV on hip and rectum; non pressure wound on pretibial)                       Personal Care Assistance Level of Assistance  Bathing, Feeding, Dressing Bathing Assistance: Maximum assistance Feeding assistance: Limited assistance Dressing Assistance: Maximum assistance     Functional Limitations Info             SPECIAL CARE FACTORS FREQUENCY                       Contractures Contractures Info: Not present    Additional Factors Info  Code Status, Allergies Code Status Info: Full Allergies Info: Ivp Dye (Iodinated Contrast Media)           Current Medications (12/14/2023):  This is the current hospital active medication list Current Facility-Administered Medications  Medication Dose Route Frequency Provider Last Rate Last Admin   acetaminophen  (TYLENOL ) tablet 650 mg  650 mg Oral Once Alissa April, MD       acetaminophen  (TYLENOL ) tablet 650 mg  650 mg Oral Q6H PRN Arne Langdon, MD       ceFEPIme  (MAXIPIME ) 2 g in sodium chloride  0.9 % 100 mL IVPB  2 g Intravenous Q12H  Ivery Marking Q, RPH-CPP       Chlorhexidine  Gluconate Cloth 2 % PADS 6 each  6 each Topical Daily Arne Langdon, MD   6 each at 12/14/23 1000   linezolid (ZYVOX) tablet 600 mg  600 mg Oral BID Pham, Minh Q, RPH-CPP       LORazepam  (ATIVAN ) tablet 0.5 mg  0.5 mg Oral BID Arne Langdon, MD   0.5 mg at 12/13/23 2145   oxyCODONE  (Oxy IR/ROXICODONE ) immediate release tablet 5 mg  5 mg Oral Q4H PRN Moore, Willie, MD         Discharge Medications: Please see discharge summary for a list of discharge medications.  Relevant Imaging Results:  Relevant Lab  Results:   Additional Information SSN: [id number]  Jannice Mends, LCSW
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
## 2023-12-14 NOTE — Plan of Care (Signed)
   Problem: Education: Goal: Knowledge of General Education information will improve Description Including pain rating scale, medication(s)/side effects and non-pharmacologic comfort measures Outcome: Progressing   Problem: Health Behavior/Discharge Planning: Goal: Ability to manage health-related needs will improve Outcome: Progressing

## 2023-12-14 NOTE — Progress Notes (Signed)
 Received patient from ER A&O x 4 , WOC already consulted patient in ER. Patient refused would care or for me to assess wound during admission. Patient was more concerned with eating.   Shritha Bresee, Cammie Cellar, RN

## 2023-12-14 NOTE — Progress Notes (Signed)
 PHARMACY - PHYSICIAN COMMUNICATION CRITICAL VALUE ALERT - BLOOD CULTURE IDENTIFICATION (BCID)  Luke Velasquez is an 23 y.o. male who presented to [facility] on 12/13/2023 with a chief complaint of fall/concern for sepsis.   Assessment:  23 year old male admitted with concern for UTI vs sacral decubitus wound infection. Blood cultures with 1/4 gram positive rods. Likely a contaminant.   Name of physician (or Provider) Contacted: Ghimire  Current antibiotics: Linezolid/ceftriaxone    Changes to prescribed antibiotics recommended:  None based on blood cultures     Luke Velasquez, PharmD, BCPS, BCIDP Infectious Diseases Clinical Pharmacist Phone: [phone] 12/14/2023  12:32 PM

## 2023-12-14 NOTE — Progress Notes (Signed)
 Patient continues to refuse wound care and labs. MD made aware   Allani Reber, Cammie Cellar, RN
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
## 2023-12-14 NOTE — Progress Notes (Signed)
 Ok to transition cefepime /vanc to ceftriaxone /linezolid/flagyl  to complete 7d per Dr. Hilton Lucky.  Ivery Marking, PharmD, BCIDP, AAHIVP, CPP Infectious Disease Pharmacist 12/14/2023 9:33 AM

## 2023-12-15 DIAGNOSIS — L8993 Pressure ulcer of unspecified site, stage 3: Secondary | ICD-10-CM | POA: Diagnosis not present

## 2023-12-15 DIAGNOSIS — D75839 Thrombocytosis, unspecified: Secondary | ICD-10-CM | POA: Diagnosis not present

## 2023-12-15 DIAGNOSIS — D649 Anemia, unspecified: Secondary | ICD-10-CM | POA: Diagnosis not present

## 2023-12-15 LAB — BASIC METABOLIC PANEL WITH GFR
Anion gap: 6 (ref 5–15)
BUN: 12 mg/dL (ref 6–20)
CO2: 24 mmol/L (ref 22–32)
Calcium: 7.8 mg/dL — ABNORMAL LOW (ref 8.9–10.3)
Chloride: 106 mmol/L (ref 98–111)
Creatinine, Ser: 0.77 mg/dL (ref 0.61–1.24)
GFR, Estimated: >60 mL/min (ref >60)
Glucose, Bld: 86 mg/dL (ref 70–99)
Potassium: 4.1 mmol/L (ref 3.5–5.1)
Sodium: 136 mmol/L (ref 135–145)

## 2023-12-15 LAB — CULTURE, BLOOD (ROUTINE X 2)
Culture  Setup Time: NO GROWTH
Special Requests: ADEQUATE

## 2023-12-15 LAB — URINE CULTURE: Culture: 20000 — AB

## 2023-12-15 LAB — CBC
HCT: 22.5 % — ABNORMAL LOW (ref 39.0–52.0)
Hemoglobin: 6.2 g/dL — CL (ref 13.0–17.0)
MCH: 22.9 pg — ABNORMAL LOW (ref 26.0–34.0)
MCHC: 27.6 g/dL — ABNORMAL LOW (ref 30.0–36.0)
MCV: 83 fL (ref 80.0–100.0)
Platelets: 725 10*3/uL — ABNORMAL HIGH (ref 150–400)
RBC: 2.71 MIL/uL — ABNORMAL LOW (ref 4.22–5.81)
RDW: 23.1 % — ABNORMAL HIGH (ref 11.5–15.5)
WBC: 10 10*3/uL (ref 4.0–10.5)
nRBC: 0 % (ref 0.0–0.2)

## 2023-12-15 NOTE — Progress Notes (Signed)
 OT Cancellation Note  Patient Details Name: Luke Velasquez MRN: [id number] DOB: Jun 17, 2001   Cancelled Treatment:    Reason Eval/Treat Not Completed: (P) Patient declined, tired, asked to return later after resting for longer. Will return later today  Scherry Curtis 12/15/2023, 12:10 PM
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
## 2023-12-15 NOTE — Progress Notes (Signed)
 PT Cancellation Note  Patient Details Name: Luke Velasquez MRN: [id number] DOB: 2000/08/09   Cancelled Treatment:    Reason Eval/Treat Not Completed: Patient declined, no reason specified  Fatigued, declines to work with acute rehab at this time. Will attempt tomorrow.  Jory Ng, PT, DPT [facility]  Rehabilitation Services Physical Therapist Office: [phone] Website: [email or web address]   Alinda Irani 12/15/2023, 2:09 PM

## 2023-12-15 NOTE — TOC Progression Note (Signed)
 Transition of Care [facility]) - Progression Note    Patient Details  Name: Luke Velasquez MRN: [id number] Date of Birth: 2001-04-06  Transition of [facility]) CM/SW Contact  Jannice Mends, LCSW Phone Number: 12/15/2023, 4:10 PM  Clinical Narrative:    CSW spoke with patient's mother regarding SNF placement. CSW discussed with her the process and that no SNF bed offers are available as of yet. CSW requested [facility] review but cautioned mom that placement may be farther out. She stated they have family in [place] if that helps. Patient has been to SNF rehab in [place]  back in 2019 before they moved here in 2020 but she would like CSW to remind patient of how a nursing facility is and that he would have to transition to sign his Disability check over monthly if we cannot obtain a rehab authorization. She stated patient needs to know that as he uses his money now for [employer]. Mom does have patient on her insurance [employer] ID# [id number], group# [id number]) but she uses his [facility] as she does not want to receive a bill for patient. Mom shared that she wants patient to have a safe place to go and that he does not want to continue to live with her, which she accepts but wants to make sure he has all the facts. CSW explained that Hospice may need to be revoked depending on what the facility needs from the insurance. She reported understanding. CSW continuing search.    Expected Discharge Plan: Skilled Nursing Facility Barriers to Discharge: Continued Medical Work up, English as a second language teacher, SNF Pending bed offer  Expected Discharge Plan and Services In-house Referral: Clinical Social Work   Post Acute Care Choice: Skilled Nursing Facility Living arrangements for the past 2 months: Single Family Home                                       Social Determinants of Health (SDOH) Interventions SDOH Screenings   Food Insecurity: No Food Insecurity (12/15/2023)   Recent Concern: Food Insecurity - Food Insecurity Present (12/01/2023)  Housing: Low Risk  (12/15/2023)  Transportation Needs: No Transportation Needs (12/15/2023)  Utilities: Not At Risk (12/15/2023)  Depression (PHQ2-9): Low Risk  (06/27/2020)  Recent Concern: Depression (PHQ2-9) - Medium Risk (05/16/2020)  Tobacco Use: High Risk (12/13/2023)    Readmission Risk Interventions    12/14/2023    2:24 PM  Readmission Risk Prevention Plan  Transportation Screening Complete  Medication Review (RN Care Manager) Complete  PCP or Specialist appointment within 3-5 days of discharge Complete  HRI or Home Care Consult Complete  SW Recovery Care/Counseling Consult Complete  Palliative Care Screening Not Applicable  Skilled Nursing Facility Complete

## 2023-12-15 NOTE — Plan of Care (Signed)

## 2023-12-15 NOTE — Progress Notes (Signed)
 OT Cancellation Note  Patient Details Name: Tyion Boylen MRN: [id number] DOB: June 14, 2001   Cancelled Treatment:    Reason Eval/Treat Not Completed: (P) Fatigue/lethargy limiting ability to participate, Pt very tired, fatigued, declining OOB activities, will return tomorrow.  Scherry Curtis 12/15/2023, 2:05 PM
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
## 2023-12-16 DIAGNOSIS — L8993 Pressure ulcer of unspecified site, stage 3: Secondary | ICD-10-CM | POA: Diagnosis not present

## 2023-12-16 DIAGNOSIS — D75839 Thrombocytosis, unspecified: Secondary | ICD-10-CM

## 2023-12-16 DIAGNOSIS — D649 Anemia, unspecified: Secondary | ICD-10-CM

## 2023-12-16 LAB — PREPARE RBC (CROSSMATCH)

## 2023-12-16 MED ORDER — SODIUM CHLORIDE 0.9 % IV SOLN
1.0000 g | Freq: Three times a day (TID) | INTRAVENOUS | Status: DC
  Administered 2023-12-16 – 2023-12-23 (×18): 1 g via INTRAVENOUS
  Filled 2023-12-16 (×21): qty 20

## 2023-12-16 MED ORDER — DIPHENHYDRAMINE HCL 25 MG PO CAPS
25.0000 mg | ORAL_CAPSULE | Freq: Once | ORAL | Status: AC
  Administered 2023-12-16: 25 mg via ORAL
  Filled 2023-12-16: qty 1

## 2023-12-16 MED ORDER — SODIUM CHLORIDE 0.9% IV SOLUTION: Freq: Once | INTRAVENOUS | Status: DC

## 2023-12-16 MED ORDER — ACETAMINOPHEN 325 MG PO TABS
650.0000 mg | ORAL_TABLET | Freq: Once | ORAL | Status: AC
  Administered 2023-12-16: 650 mg via ORAL
  Filled 2023-12-16: qty 2

## 2023-12-16 NOTE — TOC Progression Note (Signed)
 Transition of Care [facility]) - Progression Note    Patient Details  Name: Luke Velasquez MRN: [id number] Date of Birth: 2001/01/30  Transition of Care [facility]) CM/SW Contact  Jannice Mends, LCSW Phone Number: 12/16/2023, 4:38 PM  Clinical Narrative:    CSW received call from Kerman with [facility] [phone]). She stated she has received a report on patient and confirmed he is requesting placement so she wondered how it would work with his age. CSW explained no current SNF bed offers. She requested CSW keep up her updated.     Expected Discharge Plan: Skilled Nursing Facility Barriers to Discharge: Continued Medical Work up, English as a second language teacher, SNF Pending bed offer  Expected Discharge Plan and Services In-house Referral: Clinical Social Work   Post Acute Care Choice: Skilled Nursing Facility Living arrangements for the past 2 months: Single Family Home                                       Social Determinants of Health (SDOH) Interventions SDOH Screenings   Food Insecurity: No Food Insecurity (12/15/2023)  Recent Concern: Food Insecurity - Food Insecurity Present (12/01/2023)  Housing: Low Risk  (12/15/2023)  Transportation Needs: No Transportation Needs (12/15/2023)  Utilities: Not At Risk (12/15/2023)  Depression (PHQ2-9): Low Risk  (06/27/2020)  Recent Concern: Depression (PHQ2-9) - Medium Risk (05/16/2020)  Tobacco Use: High Risk (12/13/2023)    Readmission Risk Interventions    12/14/2023    2:24 PM  Readmission Risk Prevention Plan  Transportation Screening Complete  Medication Review (RN Care Manager) Complete  PCP or Specialist appointment within 3-5 days of discharge Complete  HRI or Home Care Consult Complete  SW Recovery Care/Counseling Consult Complete  Palliative Care Screening Not Applicable  Skilled Nursing Facility Complete
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
## 2023-12-16 NOTE — Progress Notes (Addendum)
 Wound culture came back MSSA. D/w Dr. Hilton Lucky and we will use cefepime  alone to cover pseudomonas and MSSA to complete 7d.   Addendum  Wound also grew out resistant pseudomonas. D/w Dr. Hilton Lucky, we will change cefepime  to Meropenem x7d   Ivery Marking, PharmD, BCIDP, AAHIVP, CPP Infectious Disease Pharmacist 12/16/2023 8:28 AM

## 2023-12-17 DIAGNOSIS — D649 Anemia, unspecified: Secondary | ICD-10-CM | POA: Diagnosis not present

## 2023-12-17 DIAGNOSIS — D75839 Thrombocytosis, unspecified: Secondary | ICD-10-CM | POA: Diagnosis not present

## 2023-12-17 DIAGNOSIS — L8993 Pressure ulcer of unspecified site, stage 3: Secondary | ICD-10-CM | POA: Diagnosis not present

## 2023-12-17 LAB — TYPE AND SCREEN
ABO/RH(D): O POS
Antibody Screen: NEGATIVE
Unit division: 0

## 2023-12-17 LAB — BPAM RBC
Blood Product Expiration Date: 202506252359
ISSUE DATE / TIME: 202505301349
Unit Type and Rh: 5100

## 2023-12-17 LAB — CBC
HCT: 26.1 % — ABNORMAL LOW (ref 39.0–52.0)
Hemoglobin: 7.4 g/dL — ABNORMAL LOW (ref 13.0–17.0)
MCH: 22.9 pg — ABNORMAL LOW (ref 26.0–34.0)
MCHC: 28.4 g/dL — ABNORMAL LOW (ref 30.0–36.0)
MCV: 80.8 fL (ref 80.0–100.0)
Platelets: 643 10*3/uL — ABNORMAL HIGH (ref 150–400)
RBC: 3.23 MIL/uL — ABNORMAL LOW (ref 4.22–5.81)
RDW: 22.9 % — ABNORMAL HIGH (ref 11.5–15.5)
WBC: 10.5 10*3/uL (ref 4.0–10.5)
nRBC: 0 % (ref 0.0–0.2)

## 2023-12-17 NOTE — Plan of Care (Signed)
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
## 2023-12-17 NOTE — Plan of Care (Signed)
 Pt has rested quietly throughout the night with no distress noted. Alert and oriented. On room air. SR on the  monitor. Foley cath intact to BSD yellow urine. Pt refused labs and did not want to be bothered. No complaints voiced.     Problem: Education: Goal: Knowledge of General Education information will improve Description: Including pain rating scale, medication(s)/side effects and non-pharmacologic comfort measures Outcome: Progressing   Problem: Clinical Measurements: Goal: Cardiovascular complication will be avoided Outcome: Progressing   Problem: Coping: Goal: Level of anxiety will decrease Outcome: Progressing   Problem: Pain Managment: Goal: General experience of comfort will improve and/or be controlled Outcome: Progressing

## 2023-12-18 DIAGNOSIS — D649 Anemia, unspecified: Secondary | ICD-10-CM | POA: Diagnosis not present

## 2023-12-18 LAB — AEROBIC/ANAEROBIC CULTURE W GRAM STAIN (SURGICAL/DEEP WOUND): Gram Stain: NONE SEEN

## 2023-12-18 LAB — CULTURE, BLOOD (ROUTINE X 2)
Culture: NO GROWTH
Special Requests: ADEQUATE
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
## 2023-12-18 NOTE — Progress Notes (Signed)
 Luke Velasquez [phone] [facility] Liaison Note?     Luke Velasquez is a current [facility] hospice patient with a terminal diagnosis of unspecified severe protein calorie malnutrition. Patient fell out of his wheelchair at home and called 911 himself. [name] called [facility] to advise that patient had been transported to the hospital, but she was unsure of which location. She also advised that she does not want Luke Velasquez to return home and would like him placed in a SNF. Patient was admitted with concerns for UTI and sepsis. Per Dr. Tessie Fila with [facility] this is a related hospital admission.    Visited patient in hospital who was resting quietly, appearing to be asleep. Patient appeared to be in no distress and not attempted to awaken.   Patient is GIP appropriate for IV antibiotics and wound care.   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   Normocytic anemia Multifactorial-oozing from wound-and anemia of chronic disease.  However no active bleeding evident. Initially used PRBC transfusion-however upon further discussion-he was agreeable to get 1 unit of PRBC on 5/30-posttransfusion Hb stable, monitor periodically   Note-patient has been refusing blood work-but after discussion at bedside-reluctantly is agreeable.   Thrombocytosis Likely  reactive-in the setting of chronic sacral decubitus ulceration/inflammation. Discharge Planning: ongoing, SNF pending bed offer   Family contact: patient is his own spokesperson   IDT: updated   Goals of Care: full code   Should patient need ambulance transport at discharge, please call [facility] as we contract with them for our active hospice patients.   Please call with any hospice questions or concerns.   Madelene Schanz, BSN, RN, [facility] [phone]

## 2023-12-19 DIAGNOSIS — M4628 Osteomyelitis of vertebra, sacral and sacrococcygeal region: Secondary | ICD-10-CM | POA: Diagnosis not present

## 2023-12-19 DIAGNOSIS — G8220 Paraplegia, unspecified: Secondary | ICD-10-CM | POA: Diagnosis not present

## 2023-12-19 DIAGNOSIS — M86652 Other chronic osteomyelitis, left thigh: Secondary | ICD-10-CM

## 2023-12-19 DIAGNOSIS — D649 Anemia, unspecified: Secondary | ICD-10-CM | POA: Diagnosis not present

## 2023-12-19 MED ORDER — MIRTAZAPINE 15 MG PO TABS
15.0000 mg | ORAL_TABLET | Freq: Every day | ORAL | Status: AC
  Administered 2023-12-19 – 2024-07-30 (×103): 15 mg via ORAL
  Filled 2023-12-19 (×126): qty 1

## 2023-12-19 NOTE — TOC Progression Note (Signed)
 Transition of Care [facility]) - Progression Note    Patient Details  Name: Luke Velasquez MRN: [id number] Date of Birth: 2001/01/24  Transition of Care [facility]) CM/SW Contact  Jannice Mends, LCSW Phone Number: 12/19/2023, 9:12 AM  Clinical Narrative:    CSW inquiring with the [place] [facility] [place] [facility]F.    Expected Discharge Plan: Skilled Nursing Facility Barriers to Discharge: Continued Medical Work up, English as a second language teacher, SNF Pending bed offer  Expected Discharge Plan and Services In-house Referral: Clinical Social Work   Post Acute Care Choice: Skilled Nursing Facility Living arrangements for the past 2 months: Single Family Home                                       Social Determinants of Health (SDOH) Interventions SDOH Screenings   Food Insecurity: No Food Insecurity (12/15/2023)  Recent Concern: Food Insecurity - Food Insecurity Present (12/01/2023)  Housing: Low Risk  (12/15/2023)  Transportation Needs: No Transportation Needs (12/15/2023)  Utilities: Not At Risk (12/15/2023)  Depression (PHQ2-9): Low Risk  (06/27/2020)  Recent Concern: Depression (PHQ2-9) - Medium Risk (05/16/2020)  Tobacco Use: High Risk (12/13/2023)    Readmission Risk Interventions    12/14/2023    2:24 PM  Readmission Risk Prevention Plan  Transportation Screening Complete  Medication Review (RN Care Manager) Complete  PCP or Specialist appointment within 3-5 days of discharge Complete  HRI or Home Care Consult Complete  SW Recovery Care/Counseling Consult Complete  Palliative Care Screening Not Applicable  Skilled Nursing Facility Complete

## 2023-12-19 NOTE — Plan of Care (Signed)
 No change today.  Dressings changed and vital remain stable.

## 2023-12-19 NOTE — Progress Notes (Signed)
 Luke Velasquez [phone] [facility] Liaison Note?     [name] is a current [facility] hospice patient with a terminal diagnosis of unspecified severe protein calorie malnutrition. Patient fell out of his wheelchair at home and called 911 himself. [name] called [facility] to advise that patient had been transported to the hospital, but she was unsure of which location. She also advised that she does not want Luke Velasquez to return home and would like him placed in a SNF. Patient was admitted with concerns for UTI and sepsis. Per Dr. Tessie Velasquez with [facility] this is a related hospital admission.    Visited patient in hospital who was receiving personal care at time of visit. Bedside nurse not available to exchange report.    Patient is GIP appropriate for IV antibiotics and wound care.   VS: 98.1/83/19   112/71   100% on RA   I/O: not recorded/ 1,300   Abnormal Labs: none new   Diagnostics: none new   IV/PRN meds: Merrem  1g IV q 8 hrs   Assessment and Plan from Luke Castleman, MD note dated 6.2: Assessment/Plan: Sepsis-likely secondary to complicated UTI-possible sacral decubitus infection Sepsis physiology resolved Culture data as above Continue meropenem  (as discussed with pharmacy-this should cover both MSSA/Pseudomonas) will get ID input as well.   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   Normocytic anemia Multifactorial-oozing from wound-and anemia of chronic disease.  However no active bleeding evident. Initially used PRBC transfusion-however upon further discussion-he was agreeable to get 1 unit of PRBC on 5/30-posttransfusion Hb stable, monitor periodically   Note-patient has been refusing blood work-but after  discussion at bedside-reluctantly is agreeable.   Thrombocytosis Likely reactive-in the setting of chronic sacral decubitus ulceration/inflammation.   Hypokalemia Repleted.   Discharge Planning: ongoing, SNF pending bed offer   Family contact: patient is his own spokesperson   IDT: updated   Goals of Care: full code   Should patient need ambulance transport at discharge, please call [facility] as we contract with them for our active hospice patients.   Please call with any hospice questions or concerns.   Luke Beckwith, LPN [facility] Liaison [phone]
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
## 2023-12-20 DIAGNOSIS — D649 Anemia, unspecified: Secondary | ICD-10-CM | POA: Diagnosis not present

## 2023-12-20 NOTE — TOC Progression Note (Signed)
 Transition of Care [facility]) - Progression Note    Patient Details  Name: Luke Velasquez MRN: [id number] Date of Birth: 07-Nov-2000  [facility]) CM/SW Contact  Jannice Mends, LCSW Phone Number: 12/20/2023, 9:34 AM  Clinical Narrative:    Per Illene Malm, the [facility] is now declining patient due to wound. She is checking to see if their [place] building will have a bed opening up. If patient requires Hospice at SNF, they only contract with [facility]. CSW received call from [place] at [facility] and made her aware that CSW will speak with the patient on his goals and the SNF will let CSW know if he is able to have Hospice or if that would interfere with billing [facility] Medicaid.   Expected Discharge Plan: Skilled Nursing Facility Barriers to Discharge: Continued Medical Work up, English as a second language teacher, SNF Pending bed offer  Expected Discharge Plan and Services In-house Referral: Clinical Social Work   Post Acute Care Choice: Skilled Nursing Facility Living arrangements for the past 2 months: Single Family Home                                       Social Determinants of Health (SDOH) Interventions SDOH Screenings   Food Insecurity: No Food Insecurity (12/15/2023)  Recent Concern: Food Insecurity - Food Insecurity Present (12/01/2023)  Housing: Low Risk  (12/15/2023)  Transportation Needs: No Transportation Needs (12/15/2023)  Utilities: Not At Risk (12/15/2023)  Depression (PHQ2-9): Low Risk  (06/27/2020)  Recent Concern: Depression (PHQ2-9) - Medium Risk (05/16/2020)  Tobacco Use: High Risk (12/13/2023)    Readmission Risk Interventions    12/14/2023    2:24 PM  Readmission Risk Prevention Plan  Transportation Screening Complete  Medication Review (RN Care Manager) Complete  PCP or Specialist appointment within 3-5 days of discharge Complete  HRI or Home Care Consult Complete  SW Recovery Care/Counseling Consult Complete  Palliative Care Screening  Not Applicable  Skilled Nursing Facility Complete

## 2023-12-20 NOTE — Plan of Care (Signed)

## 2023-12-20 NOTE — Progress Notes (Signed)
 Patient is not allowing patient care to be given. He refused labs, vitals, medications, CHG wipe down, virtually all patient care. Provider notified. When attempting to persuade patient to allow patient care he is profusely saying no and moving arm away from scanner. Tried therapeutic communication and it failed as well.

## 2023-12-20 NOTE — Progress Notes (Signed)
 PT Cancellation Note  Patient Details Name: Luke Velasquez MRN: [id number] DOB: 04/01/2001   Cancelled Treatment:    Reason Eval/Treat Not Completed: Patient declined, no reason specified Eating lunch. Offered to return later in the afternoon. Declined, states he would be agreeable for [facility] training tomorrow if therapy stops by.  Jory Ng, PT, DPT [facility]  Rehabilitation Services Physical Therapist Office: [phone] Website: [email or web address]    Alinda Irani 12/20/2023, 4:49 PM
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
## 2023-12-20 NOTE — Plan of Care (Signed)
  Problem: Education: Goal: Knowledge of General Education information will improve Description: Including pain rating scale, medication(s)/side effects and non-pharmacologic comfort measures Outcome: Not Progressing   Problem: Health Behavior/Discharge Planning: Goal: Ability to manage health-related needs will improve Outcome: Not Progressing   Problem: Clinical Measurements: Goal: Ability to maintain clinical measurements within normal limits will improve Outcome: Not Progressing Goal: Will remain free from infection Outcome: Not Progressing Goal: Diagnostic test results will improve Outcome: Not Progressing Goal: Respiratory complications will improve Outcome: Not Progressing Goal: Cardiovascular complication will be avoided Outcome: Not Progressing   Problem: Activity: Goal: Risk for activity intolerance will decrease Outcome: Not Progressing   Problem: Nutrition: Goal: Adequate nutrition will be maintained Outcome: Not Progressing   Problem: Coping: Goal: Level of anxiety will decrease Outcome: Not Progressing   Problem: Elimination: Goal: Will not experience complications related to bowel motility Outcome: Not Progressing   Problem: Pain Managment: Goal: General experience of comfort will improve and/or be controlled Outcome: Not Progressing   Problem: Safety: Goal: Ability to remain free from injury will improve Outcome: Not Progressing   Problem: Skin Integrity: Goal: Risk for impaired skin integrity will decrease Outcome: Not Progressing

## 2023-12-20 NOTE — Plan of Care (Signed)
  Problem: Clinical Measurements: Goal: Ability to maintain clinical measurements within normal limits will improve Outcome: Progressing Goal: Diagnostic test results will improve Outcome: Progressing Goal: Respiratory complications will improve Outcome: Progressing Goal: Cardiovascular complication will be avoided Outcome: Progressing   Problem: Nutrition: Goal: Adequate nutrition will be maintained Outcome: Progressing   Problem: Elimination: Goal: Will not experience complications related to bowel motility Outcome: Progressing   Problem: Pain Managment: Goal: General experience of comfort will improve and/or be controlled Outcome: Progressing   Problem: Safety: Goal: Ability to remain free from injury will improve Outcome: Progressing

## 2023-12-20 NOTE — Progress Notes (Signed)
 Pharmacy: Antimicrobial Stewardship Note  Clarified with Dr. Levern Reader on antibiotics plans/LOT, wanted total LOT of 14d including initial therapy prior to switching to a carbapenem. Initial abx started on 5/27, stop date adjusted to 12/26/23.  Thank you for allowing pharmacy to be a part of this patient's care.  Garland Junk, PharmD, BCPS, BCIDP Infectious Diseases Clinical Pharmacist 12/20/2023 4:11 PM   **Pharmacist phone directory can now be found on [email or web address] ([email or web address]).  Listed under [facility].

## 2023-12-20 NOTE — Progress Notes (Signed)
 Patient refused turning and repositioning all night, vital signs for 12a and 0400. He also refused morning labs.
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
## 2023-12-20 NOTE — Progress Notes (Signed)
 Patient refused to taking vitals

## 2023-12-21 ENCOUNTER — Inpatient Hospital Stay (HOSPITAL_COMMUNITY)

## 2023-12-21 ENCOUNTER — Other Ambulatory Visit: Payer: Self-pay

## 2023-12-21 DIAGNOSIS — D649 Anemia, unspecified: Secondary | ICD-10-CM | POA: Diagnosis not present

## 2023-12-21 LAB — BASIC METABOLIC PANEL WITH GFR
Anion gap: 8 (ref 5–15)
BUN: 15 mg/dL (ref 6–20)
CO2: 26 mmol/L (ref 22–32)
Calcium: 8.7 mg/dL — ABNORMAL LOW (ref 8.9–10.3)
Chloride: 104 mmol/L (ref 98–111)
Creatinine, Ser: 0.55 mg/dL — ABNORMAL LOW (ref 0.61–1.24)
GFR, Estimated: >60 mL/min (ref >60)
Glucose, Bld: 89 mg/dL (ref 70–99)
Potassium: 3.8 mmol/L (ref 3.5–5.1)
Sodium: 138 mmol/L (ref 135–145)

## 2023-12-21 LAB — TYPE AND SCREEN
ABO/RH(D): O POS
Antibody Screen: NEGATIVE

## 2023-12-21 LAB — TSH: TSH: 1.097 u[IU]/mL (ref 0.350–4.500)

## 2023-12-21 LAB — MAGNESIUM: Magnesium: 1.8 mg/dL (ref 1.7–2.4)

## 2023-12-21 LAB — T4, FREE: Free T4: 0.58 ng/dL — ABNORMAL LOW (ref 0.61–1.12)

## 2023-12-21 MED ORDER — LACTATED RINGERS IV BOLUS
500.0000 mL | Freq: Once | INTRAVENOUS | Status: AC
  Administered 2023-12-21: 500 mL via INTRAVENOUS

## 2023-12-21 MED ORDER — METOPROLOL TARTRATE 5 MG/5ML IV SOLN
5.0000 mg | Freq: Three times a day (TID) | INTRAVENOUS | Status: DC | PRN
  Administered 2023-12-21 – 2023-12-22 (×2): 5 mg via INTRAVENOUS
  Filled 2023-12-21 (×2): qty 5

## 2023-12-21 MED ORDER — MORPHINE SULFATE (PF) 2 MG/ML IV SOLN
1.0000 mg | Freq: Once | INTRAVENOUS | Status: AC
  Administered 2023-12-21: 1 mg via INTRAVENOUS
  Filled 2023-12-21: qty 1

## 2023-12-21 NOTE — TOC Progression Note (Signed)
 Transition of Care [facility]) - Progression Note    Patient Details  Name: Luke Velasquez MRN: [id number] Date of Birth: Aug 27, 2000  Transition of Care [facility]) CM/SW Contact  Jannice Mends, LCSW Phone Number: 12/21/2023, 2:50 PM  Clinical Narrative:    Per [facility] Healthcare liaison, awaiting bed to open at [facility]. CSW received request from [facility] to contact their SW, Stephenie Einstein [phone]) regarding placement. CSW left her a voicemail.    Expected Discharge Plan: Skilled Nursing Facility Barriers to Discharge: Continued Medical Work up, English as a second language teacher, SNF Pending bed offer  Expected Discharge Plan and Services In-house Referral: Clinical Social Work   Post Acute Care Choice: Skilled Nursing Facility Living arrangements for the past 2 months: Single Family Home                                       Social Determinants of Health (SDOH) Interventions SDOH Screenings   Food Insecurity: No Food Insecurity (12/15/2023)  Recent Concern: Food Insecurity - Food Insecurity Present (12/01/2023)  Housing: Low Risk  (12/15/2023)  Transportation Needs: No Transportation Needs (12/15/2023)  Utilities: Not At Risk (12/15/2023)  Depression (PHQ2-9): Low Risk  (06/27/2020)  Recent Concern: Depression (PHQ2-9) - Medium Risk (05/16/2020)  Tobacco Use: High Risk (12/13/2023)    Readmission Risk Interventions    12/14/2023    2:24 PM  Readmission Risk Prevention Plan  Transportation Screening Complete  Medication Review (RN Care Manager) Complete  PCP or Specialist appointment within 3-5 days of discharge Complete  HRI or Home Care Consult Complete  SW Recovery Care/Counseling Consult Complete  Palliative Care Screening Not Applicable  Skilled Nursing Facility Complete

## 2023-12-21 NOTE — Progress Notes (Signed)
 Pt seen for PT/OT co-treat. Noted wounds significant on sacrum and Lt hip. Pt has order for mattress replacement for low air los pressure relief mattress placed on 12/13/23. Pt on regular foam mattress at today's visit. Air cushion placed in recliner and requested Licensed conveyancer to call for mattress replacement for pt. RN notified and asked to return/transfer pt back to bed as soon as air mattress is delivered as pt should not be upright in chair for extended length of time given significant skin breakdown. Discussed pressure relief techniques with pt and pt demonstrated ability to set timer on phone and performed chair push up every 2-3 minutes. Pt verbalized understanding but will likely need review from all staff/team members on importance. Will follow up today for mattress delivery.  Tish Forge, DPT Acute Rehabilitation Services Office [phone]  12/21/23 10:32 AM
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
## 2023-12-21 NOTE — Progress Notes (Signed)
 Pt refused abx tonight. Pt educated about benefits of abx and risks of not getting abx for his condition. Pt verbalized understanding but continued to refuse abx, On-call [facility], Dr. Michell Ahumada, notified.

## 2023-12-21 NOTE — Progress Notes (Signed)
Pt refused 4am VS.  

## 2023-12-21 NOTE — Plan of Care (Signed)
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
## 2023-12-22 DIAGNOSIS — D649 Anemia, unspecified: Secondary | ICD-10-CM | POA: Diagnosis not present

## 2023-12-22 LAB — CBC WITH DIFFERENTIAL/PLATELET
Abs Immature Granulocytes: 0.03 10*3/uL (ref 0.00–0.07)
Basophils Absolute: 0.1 10*3/uL (ref 0.0–0.1)
Basophils Relative: 1 %
Eosinophils Absolute: 0.3 10*3/uL (ref 0.0–0.5)
Eosinophils Relative: 3 %
HCT: 28.5 % — ABNORMAL LOW (ref 39.0–52.0)
Hemoglobin: 8.3 g/dL — ABNORMAL LOW (ref 13.0–17.0)
Immature Granulocytes: 0 %
Lymphocytes Relative: 19 %
Lymphs Abs: 2.4 10*3/uL (ref 0.7–4.0)
MCH: 25.3 pg — ABNORMAL LOW (ref 26.0–34.0)
MCHC: 29.1 g/dL — ABNORMAL LOW (ref 30.0–36.0)
MCV: 86.9 fL (ref 80.0–100.0)
Monocytes Absolute: 1 10*3/uL (ref 0.1–1.0)
Monocytes Relative: 8 %
Neutro Abs: 8.3 10*3/uL — ABNORMAL HIGH (ref 1.7–7.7)
Neutrophils Relative %: 69 %
Platelets: 578 10*3/uL — ABNORMAL HIGH (ref 150–400)
RBC: 3.28 MIL/uL — ABNORMAL LOW (ref 4.22–5.81)
RDW: 25.2 % — ABNORMAL HIGH (ref 11.5–15.5)
Smear Review: NORMAL
WBC: 12.1 10*3/uL — ABNORMAL HIGH (ref 4.0–10.5)
nRBC: 0 % (ref 0.0–0.2)

## 2023-12-22 MED ORDER — TRAZODONE HCL 100 MG PO TABS
100.0000 mg | ORAL_TABLET | Freq: Every day | ORAL | Status: DC
  Administered 2023-12-22 – 2024-05-04 (×99): 100 mg via ORAL
  Filled 2023-12-22 (×2): qty 1
  Filled 2023-12-22 (×2): qty 2
  Filled 2023-12-22 (×3): qty 1
  Filled 2023-12-22: qty 2
  Filled 2023-12-22 (×3): qty 1
  Filled 2023-12-22 (×2): qty 2
  Filled 2023-12-22 (×2): qty 1
  Filled 2023-12-22 (×2): qty 2
  Filled 2023-12-22 (×2): qty 1
  Filled 2023-12-22: qty 2
  Filled 2023-12-22 (×2): qty 1
  Filled 2023-12-22 (×2): qty 2
  Filled 2023-12-22 (×13): qty 1
  Filled 2023-12-22: qty 2
  Filled 2023-12-22 (×8): qty 1
  Filled 2023-12-22 (×2): qty 2
  Filled 2023-12-22 (×2): qty 1
  Filled 2023-12-22: qty 2
  Filled 2023-12-22: qty 1
  Filled 2023-12-22 (×2): qty 2
  Filled 2023-12-22 (×2): qty 1
  Filled 2023-12-22: qty 2
  Filled 2023-12-22 (×3): qty 1
  Filled 2023-12-22: qty 2
  Filled 2023-12-22 (×3): qty 1
  Filled 2023-12-22: qty 2
  Filled 2023-12-22 (×2): qty 1
  Filled 2023-12-22: qty 2
  Filled 2023-12-22 (×2): qty 1
  Filled 2023-12-22: qty 2
  Filled 2023-12-22 (×3): qty 1
  Filled 2023-12-22 (×2): qty 2
  Filled 2023-12-22: qty 1
  Filled 2023-12-22: qty 2
  Filled 2023-12-22 (×5): qty 1
  Filled 2023-12-22: qty 2
  Filled 2023-12-22 (×5): qty 1
  Filled 2023-12-22 (×2): qty 2
  Filled 2023-12-22: qty 1
  Filled 2023-12-22 (×3): qty 2
  Filled 2023-12-22 (×2): qty 1
  Filled 2023-12-22: qty 2
  Filled 2023-12-22 (×4): qty 1
  Filled 2023-12-22: qty 2
  Filled 2023-12-22 (×7): qty 1
  Filled 2023-12-22 (×2): qty 2
  Filled 2023-12-22 (×3): qty 1
  Filled 2023-12-22: qty 2
  Filled 2023-12-22 (×4): qty 1
  Filled 2023-12-22: qty 2
  Filled 2023-12-22: qty 1
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
## 2023-12-22 NOTE — Plan of Care (Signed)
  Problem: Education: Goal: Knowledge of General Education information will improve Description: Including pain rating scale, medication(s)/side effects and non-pharmacologic comfort measures Outcome: Progressing   Problem: Clinical Measurements: Goal: Cardiovascular complication will be avoided Outcome: Progressing   Problem: Coping: Goal: Level of anxiety will decrease Outcome: Progressing   Problem: Pain Managment: Goal: General experience of comfort will improve and/or be controlled Outcome: Progressing
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
## 2023-12-23 DIAGNOSIS — D649 Anemia, unspecified: Secondary | ICD-10-CM | POA: Diagnosis not present

## 2023-12-23 NOTE — Progress Notes (Signed)
 Luke Velasquez [phone] [facility] Liaison Note?     [name] is a current [facility] hospice patient with a terminal diagnosis of unspecified severe protein calorie malnutrition. Patient fell out of his wheelchair at home and called 911 himself. [name] called [facility] to advise that patient had been transported to the hospital, but she was unsure of which location. She also advised that she does not want Luke Velasquez to return home and would like him placed in a SNF. Patient was admitted with concerns for UTI and sepsis. Per Dr. Tessie Velasquez with [facility] this is a related hospital admission.    Visited patient in hospital who was sleeping at time of visit. Bedside nurse advised that he allowed labs last night.    Patient is GIP appropriate for IV antibiotics.   VS:   98.2/75/16   91/68   100% on RA   I/O: none recorded   Abnormal Labs:  12/22/23 20:55 WBC: 12.1 (H) RBC: 3.28 (L) Hemoglobin: 8.3 (L) HCT: 28.5 (L) MCH: 25.3 (L) MCHC: 29.1 (L) RDW: 25.2 (H) Platelets: 578 (H)   Diagnostics: none new   IV/PRN meds: Merrem  1g IV q 8 hrs   Assessment and Plan from Luke Castleman, MD note dated 6.6: Persistent refusal with lab draws, wound care and medication intake, flat affect, says that he is okay to die soon, seen by psychiatry, has capacity to make decision for himself, per psych no further intervention.        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   Normocytic  anemia Multifactorial-oozing from wound-and anemia of chronic disease.  However no active bleeding evident. Initially used PRBC transfusion-however upon further discussion-he was agreeable to get 1 unit of PRBC on 5/30-posttransfusion Hb stable, monitor periodically, unfortunately had partial blood drawn on 12/21/2023, refused again despite extensive counseling by me and the psychiatrist on 12/22/2023, mother also updated.   Depression.  Seen and cleared by psych.     Chr. sinus tachycardia.  Stable TSH, likely central in origin, in no distress, has been adequately hydrated, placed on low-dose beta-blocker.  Monitor.     Thrombocytosis Likely reactive-in the setting of chronic sacral decubitus ulceration/inflammation.   Hypokalemia Repleted.   Discharge Planning: ongoing, SNF pending bed offer   Family contact: patient is his own spokesperson   IDT: updated   Goals of Care: full code   Should patient need ambulance transport at discharge, please call [facility] as we contract with them for our active hospice patients.   Please call with any hospice questions or concerns  Luke Beckwith, LPN [facility] Liaison [phone]
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
## 2023-12-23 NOTE — Plan of Care (Signed)

## 2023-12-23 NOTE — TOC Progression Note (Signed)
 Transition of Care [facility]) - Progression Note    Patient Details  Name: Luke Velasquez MRN: [id number] Date of Birth: Nov 22, 2000  [facility] CM/SW Contact  Jannice Mends, LCSW Phone Number: 12/23/2023, 9:17 AM  Clinical Narrative:    Continuing to await bed availability at [facility]. No other bed offers.    Expected Discharge Plan: Skilled Nursing Facility Barriers to Discharge: Continued Medical Work up, English as a second language teacher, SNF Pending bed offer  Expected Discharge Plan and Services In-house Referral: Clinical Social Work   Post Acute Care Choice: Skilled Nursing Facility Living arrangements for the past 2 months: Single Family Home                                       Social Determinants of Health (SDOH) Interventions SDOH Screenings   Food Insecurity: No Food Insecurity (12/15/2023)  Recent Concern: Food Insecurity - Food Insecurity Present (12/01/2023)  Housing: Low Risk  (12/15/2023)  Transportation Needs: No Transportation Needs (12/15/2023)  Utilities: Not At Risk (12/15/2023)  Depression (PHQ2-9): Low Risk  (06/27/2020)  Recent Concern: Depression (PHQ2-9) - Medium Risk (05/16/2020)  Tobacco Use: High Risk (12/13/2023)    Readmission Risk Interventions    12/14/2023    2:24 PM  Readmission Risk Prevention Plan  Transportation Screening Complete  Medication Review (RN Care Manager) Complete  PCP or Specialist appointment within 3-5 days of discharge Complete  HRI or Home Care Consult Complete  SW Recovery Care/Counseling Consult Complete  Palliative Care Screening Not Applicable  Skilled Nursing Facility Complete

## 2023-12-23 NOTE — Progress Notes (Signed)
 OT Cancellation Note  Patient Details Name: Luke Velasquez MRN: [id number] DOB: Dec 15, 2000   Cancelled Treatment:    Reason Eval/Treat Not Completed: Fatigue/lethargy limiting ability to participateSleeping on entry, did not awaken to voice. Will follow up for OT session as schedule permits.  Shireen Dory 12/23/2023, 1:42 PM

## 2023-12-23 NOTE — Progress Notes (Signed)
 Upon afternoon reassessment, patient's left AC PIV half way out of position and unable to be repositioned. Patient allowed me to attempt another PIV insertion, however after failed insertion patient refusing to allow more attempts by other staff members. Patient asked if the IV antibiotics were important and I informed him they were but after consideration, patient still refusing to have PIV placed. He stated, "Maybe tomorrow."  Provider notified.
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
## 2023-12-24 DIAGNOSIS — D649 Anemia, unspecified: Secondary | ICD-10-CM | POA: Diagnosis not present

## 2023-12-24 NOTE — Plan of Care (Signed)
  Problem: Clinical Measurements: Goal: Diagnostic test results will improve Outcome: Progressing Goal: Respiratory complications will improve Outcome: Progressing Goal: Cardiovascular complication will be avoided Outcome: Progressing   Problem: Activity: Goal: Risk for activity intolerance will decrease Outcome: Progressing   Problem: Nutrition: Goal: Adequate nutrition will be maintained Outcome: Progressing   Problem: Pain Managment: Goal: General experience of comfort will improve and/or be controlled Outcome: Progressing

## 2023-12-24 NOTE — Progress Notes (Signed)
 Pt is refusing for peripheral IV has an IV meropenum scheduled at 0800.MD made aware.Also is refusing vital signs and telemetry.

## 2023-12-24 NOTE — Plan of Care (Signed)
  Problem: Clinical Measurements: Goal: Ability to maintain clinical measurements within normal limits will improve Outcome: Progressing Goal: Will remain free from infection Outcome: Progressing Goal: Diagnostic test results will improve Outcome: Progressing Goal: Respiratory complications will improve Outcome: Progressing Goal: Cardiovascular complication will be avoided 12/24/2023 1926 by Charolette Copier, RN Outcome: Progressing 12/24/2023 1926 by Charolette Copier, RN Outcome: Progressing   Problem: Activity: Goal: Risk for activity intolerance will decrease 12/24/2023 1926 by Charolette Copier, RN Outcome: Progressing 12/24/2023 1926 by Charolette Copier, RN Outcome: Progressing   Problem: Nutrition: Goal: Adequate nutrition will be maintained Outcome: Progressing   Problem: Coping: Goal: Level of anxiety will decrease Outcome: Progressing   Problem: Pain Managment: Goal: General experience of comfort will improve and/or be controlled Outcome: Progressing
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
## 2023-12-25 DIAGNOSIS — D649 Anemia, unspecified: Secondary | ICD-10-CM | POA: Diagnosis not present

## 2023-12-25 MED ORDER — CEFADROXIL 500 MG PO CAPS
500.0000 mg | ORAL_CAPSULE | Freq: Two times a day (BID) | ORAL | Status: AC
  Administered 2023-12-25 – 2023-12-31 (×14): 500 mg via ORAL
  Filled 2023-12-25 (×14): qty 1
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
## 2023-12-26 DIAGNOSIS — D649 Anemia, unspecified: Secondary | ICD-10-CM | POA: Diagnosis not present

## 2023-12-26 NOTE — Progress Notes (Signed)
 Luke Velasquez [phone] [facility] Liaison Note?     [name] is a current [facility] hospice patient with a terminal diagnosis of unspecified severe protein calorie malnutrition. Patient fell out of his wheelchair at home and called 911 himself. [name] called [facility] to advise that patient had been transported to the hospital, but she was unsure of which location. She also advised that she does not want Luke Velasquez to return home and would like him placed in a SNF. Patient was admitted with concerns for UTI and sepsis. Per Dr. Tessie Fila with [facility] this is a related hospital admission.    Visited patient in hospital who was lying in bed asleep with no acute distress noted. Attempts at waking him were unsuccessful. Spoke with bedside nurse who advised that patient continues to refuse IV antibiotics and all other treatment. He is willing to take PO antibiotics and is eating and drinking.    Patient is GIP appropriate due to requiring IV antibiotics for sepsis related to UTI and Osteomyelitis of sacral decubitus.    VS:  97.4/92/18   115/68    100% on RA   I/O: none recorded    Abnormal Labs: none new   Diagnostics: None new   IV/PRN meds: Merrem  1g IV q 8 hrs- Patient refusing, oxycodone  5 mg PO x1   Assessment and Plan from [name], MD note dated 6.9: Assessment/Plan:   Persistent refusal with lab draws, wound care and medication intake, flat affect, says that he is okay to die soon, seen by psychiatry, has capacity to make decision for himself, per psych no further intervention.   Unfortunately his prognosis is extremely poor due to noncompliance with medications, lab draws, wound care and even obtaining IV access.  He has been counseled extensively multiple times for repeat counseling 12/26/2023 after he talked to hospice nurse Jacqlyn Matas on 12/25/2023, he again refused IV access, IV antibiotics for lab draws, was again warned that he can get septic  and die.     Sepsis-likely secondary to complicated UTI-possible sacral decubitus infection Sepsis physiology resolved Culture data as above Appreciate ID input he was placed on meropenem  however he pulled his IV out and refusing another IV or IV meropenem  to be given, has been extensively counseled by me multiple times that this can cause him to develop sepsis or death, but he still refuses.  Have requested ID to switch him to oral and antibiotic alternative albeit less desirable.   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   Normocytic anemia Multifactorial-oozing from wound-and anemia of chronic disease.  However no active bleeding evident. Initially used PRBC transfusion-however upon further discussion-he was agreeable to get 1 unit of PRBC on 5/30-posttransfusion Hb stable, monitor periodically, unfortunately had partial blood drawn on 12/21/2023, refused again despite extensive counseling by me and the psychiatrist on 12/22/2023, mother also updated.   Depression.  Seen and cleared by psych.     Chr. sinus tachycardia.  Stable TSH, likely central in origin, in no distress, has been adequately hydrated, placed on low-dose beta-blocker.  Monitor.     Thrombocytosis Likely reactive-in the setting of chronic sacral decubitus ulceration/inflammation.   Hypokalemia Repleted.  Discharge Planning: ongoing, SNF pending bed offer   Family contact: patient is his own spokesperson  IDT: updated   Goals of Care: full code   Should patient need ambulance transport at discharge, please call [facility] as we contract with them for our active hospice patients.   Please call with any hospice questions or concerns.   [name], LPN [facility]  Liaison [phone]

## 2023-12-26 NOTE — Plan of Care (Signed)
 progressing
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
## 2023-12-27 DIAGNOSIS — D649 Anemia, unspecified: Secondary | ICD-10-CM | POA: Diagnosis not present

## 2023-12-27 NOTE — Progress Notes (Signed)
 Wound care done by night shift, patient refused for me to assess during assessment   Luke Velasquez, Cammie Cellar, RN

## 2023-12-27 NOTE — Plan of Care (Signed)
  Problem: Education: Goal: Knowledge of General Education information will improve Description: Including pain rating scale, medication(s)/side effects and non-pharmacologic comfort measures Outcome: Progressing   Problem: Health Behavior/Discharge Planning: Goal: Ability to manage health-related needs will improve Outcome: Progressing   Problem: Clinical Measurements: Goal: Will remain free from infection Outcome: Progressing Goal: Diagnostic test results will improve Outcome: Progressing   Problem: Activity: Goal: Risk for activity intolerance will decrease Outcome: Progressing   Problem: Nutrition: Goal: Adequate nutrition will be maintained Outcome: Progressing   Problem: Coping: Goal: Level of anxiety will decrease Outcome: Progressing   Problem: Skin Integrity: Goal: Risk for impaired skin integrity will decrease Outcome: Progressing

## 2023-12-27 NOTE — Progress Notes (Addendum)
 [facility] [phone] [facility] hospital liaison note:    Patient chose to revoke hospice benefit today.   Outpatient palliative referral will be initiated.    Please don't hesitate to reach out for any hospice related questions or concerns.   [name], [facility] liaison [phone]

## 2023-12-27 NOTE — Plan of Care (Signed)
   Problem: Education: Goal: Knowledge of General Education information will improve Description Including pain rating scale, medication(s)/side effects and non-pharmacologic comfort measures Outcome: Progressing   Problem: Health Behavior/Discharge Planning: Goal: Ability to manage health-related needs will improve Outcome: Progressing

## 2023-12-27 NOTE — TOC Progression Note (Addendum)
 Transition of Care [facility]) - Progression Note    Patient Details  Name: Luke Velasquez MRN: [id number] Date of Birth: 06-04-2001  Transition of Care [facility]) CM/SW Contact  Jannice Mends, LCSW Phone Number: 12/27/2023, 9:47 AM  Clinical Narrative:    Patti Border with APS that bed search continues. [facility] still does not have a bed available.   CSW contacted the following SNFs from the [facility] win network list:  -[facility]: Left vm for [facility]): sent email to [facility] to review -[facility]: left vm for [facility]: Left vm -[facility]: Left vm -[facility]: Left vm [phone])  -[facility] [place]: not in network -[place] [facility]: not in network per Mylinda Asa [facility]: Not able to accept -[facility]-do not accept age 59 -[facility]: Bernardo Bridgeman unable to accept  [facility]: not in network -[facility]: no ltc beds -[facility]: No ltc beds per [facility] - Extended Care Provider Request Status Address Phone  [facility] SNF Accepted pending bed availability  [address], [place] [address] [phone]  [facility] SN[facility] No beds [address], [place] [address] [phone]  [facility]  Need bed availability [address] [place], [place] [address] [phone]  [facility] [employer] Preferred SNF Declined [address]  Letty Raya [place] [address] [phone]  [facility] AND REHABILITATION Pending - Request Sent [address] Johnella Naas [place] [address] [phone]  [facility] SNF Pending - Request Sent [address] [place] [address] [phone]  [facility] Pending - Request Sent [address], [place] [address] [phone]   [facility] Pending - Request Sent [address] [place] [address] [phone]  [facility] Preferred SNF Declined Insurance not in network [address] [place] [address] [phone]  [facility] Preferred SNF Declined [address] [place] [address] [phone]  [facility] AND REHABILITATION, [facility] Preferred SNF Declined No LTC [address], [place] [address] [phone]  [facility] SN[facility] Declined Cannot meet patient's needs [address], [place] [address] [phone]  [facility] [place] Preferred SNF Declined Cannot meet patient's needs [address] [place] [address] [phone]  [facility] Preferred SNF Declined Out of network [address] [place] [address] [phone]  [facility] Jonette Nest[facility], [place] Preferred SNF Declined Cannot meet patient's needs [address], [place] [address] [phone]  [facility] SNF Declined [address], [place] [address] [phone]  [facility] Declined Cannot meet patient's needs [address] [place] [address] [phone]  [facility] Declined [address] [place] [address] [phone]  [facility] SNF Declined Insurance not in network [address] [place] [address] [phone]  [facility] Preferred SNF Declined Out of network [address] [place] [address] [phone]  [facility] Preferred SNF Declined [address], [place] [address] [phone]  [facility] Preferred SNF Declined [address], [place] [address] [phone]  [facility] Preferred SNF Declined [address], [place] [address] [phone]  [facility] SNF Declined no LTC bed at this time, Out of network [address] [place]  [address] [phone]  [facility] SNF Declined Cannot  meet patient's needs [address] [place], [place] [address] [phone]  [facility] SNF Declined no MALE bed [address], [place] [address] [phone]  [facility] Preferred SNF Declined [address] [place] [address] [phone]  [facility] Declined [address] [place] [address] [phone]  [facility] SNF Declined Age [address] [place] [address] [phone]  [facility] AND REHABILITATION OF  Preferred SNF Declined Age [address], [place] [address] [phone]  [facility] SN[facility] Declined [address] [place] [address] [phone]  [facility] SNF Declined Cannot meet patient's needs [address] Charleen Conn [place] [address] [phone]  [facility] SNF Declined No bed availablity [address], [place] [address] [phone]  [facility] SN[facility] Declined Age [address] [place], [place] [address] [phone]  [facility] AND REHABILITATION CENTER OF [facility] Preferred SNF Declined [address] [place] [address] [phone]  [facility] Kaylene Pascal, [place] SNF Preferred SNF Declined Insurance not in network [address] [place] [address] [phone]  [facility] Preferred SNF Declined [address], [place] [address] [phone]  [facility], [place]. Preferred SNF Declined [address], [place] [address] [phone]  [facility] SNF Declined Age [address], [place] [address] [phone]  [facility] AND [facility] SNF Declined Cannot meet patient's needs [address], [place] [address] [phone]  [facility] for Nursing and Rehabilitation Declined [address], [place] [address] [phone]  [facility] for Nursing and Rehabilitation Declined [address], [place] [address] [phone]  [facility] for Nursing and Rehabilitation Declined [address] Matt Song [place] [address] [phone]  [facility] for [facility] and Rehabilitation Declined [address], [place] [address] [phone]  [facility] for Nursing and Rehabilitation Declined [address] [place] [address] [phone]  [facility] for [facility] and Rehabilitation Declined [address], [place] [address] [phone]  [facility]AB[facility] Declined [address] [place] [address] [phone]  [facility] for Nursing and Rehabilitation Declined [address] [place] [address] [phone]  [facility] for Nursing and Rehabilitation Declined [address] [place] [address] [phone]  [facility] for [facility] and Rehabilitation Declined [address] [place] [address] [phone]     Expected Discharge Plan: Skilled Nursing Facility Barriers to Discharge: Continued Medical Work up, English as a second language teacher, SNF Pending bed offer  Expected Discharge Plan and Services In-house Referral: Clinical Social Work   Post Acute Care Choice: Skilled Nursing Facility Living arrangements for the past 2 months: Single Family Home                                       Social Determinants of Health (SDOH) Interventions SDOH Screenings   Food Insecurity: No Food Insecurity (12/15/2023)  Recent Concern: Food Insecurity - Food Insecurity Present (12/01/2023)  Housing: Low Risk  (12/15/2023)  Transportation Needs: No Transportation Needs (12/15/2023)  Utilities: Not At Risk (12/15/2023)  Depression (PHQ2-9): Low Risk  (06/27/2020)  Recent Concern: Depression (PHQ2-9) - Medium Risk  (05/16/2020)  Tobacco Use: High Risk (12/13/2023)    Readmission Risk Interventions    12/14/2023  2:24 PM  Readmission Risk Prevention Plan  Transportation Screening Complete  Medication Review Oceanographer) Complete  PCP or Specialist appointment within 3-5 days of discharge Complete  HRI or Home Care Consult Complete  SW Recovery Care/Counseling Consult Complete  Palliative Care Screening Not Applicable  Skilled Nursing Facility Complete
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
## 2023-12-27 NOTE — Plan of Care (Signed)
   Problem: Health Behavior/Discharge Planning: Goal: Ability to manage health-related needs will improve Outcome: Progressing
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
## 2023-12-28 DIAGNOSIS — A419 Sepsis, unspecified organism: Secondary | ICD-10-CM
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
## 2023-12-28 NOTE — Progress Notes (Signed)
 Patient refused wound care and dressing changes.   Marshelle Bilger, Cammie Cellar, RN

## 2023-12-28 NOTE — Plan of Care (Signed)
   Problem: Education: Goal: Knowledge of General Education information will improve Description Including pain rating scale, medication(s)/side effects and non-pharmacologic comfort measures Outcome: Progressing   Problem: Health Behavior/Discharge Planning: Goal: Ability to manage health-related needs will improve Outcome: Progressing

## 2023-12-28 NOTE — Progress Notes (Signed)
 OT Cancellation Note  Patient Details Name: Luke Velasquez MRN: [id number] DOB: 29-Mar-2001   Cancelled Treatment:    Reason Eval/Treat Not Completed: (P) Patient declined, no reason specified, Pt tired, declined OOB or bed level activities, wants to rest, will return as able.   Scherry Curtis 12/28/2023, 12:18 PM

## 2023-12-28 NOTE — TOC Progression Note (Addendum)
 Transition of Care [facility]) - Progression Note    Patient Details  Name: Luke Velasquez MRN: [id number] Date of Birth: Dec 08, 2000  [facility]) CM/SW Contact  Jannice Mends, LCSW Phone Number: 12/28/2023, 12:27 PM  Clinical Narrative:    CSW updated Camilla with APS and patient's mother. CSW contacted the following SNFs from the [facility] in network list:   -[facility]: Left vm for [facility]): sent email to [facility] to review -[facility]: left vm for [facility]: Left vm -[facility]: Left vm -[facility]: Left vm [phone])  Michael Ades: unable to accept  -[facility]: unable to accept Medicaid -[facility] [place] [place]: not in network -[facility]: not in network per [facility] [place]: Not able to accept -[facility]-do not accept age 22 -[facility]: Bernardo Bridgeman unable to accept  [facility]: not in network -[facility]: no ltc beds -[facility]: No ltc beds per [facility] Provider Request Status Address Phone  [facility] AND [facility] SNF Accepted pending bed availability  [address] [place] [address] [phone]  [facility] SNF No beds [address], [place] [address] [phone]  [facility]  Need bed availability [address] [place], [place] [address] [phone]  [facility] Preferred SNF Declined [address]ty Ray[address] [place] [address] [phone]  [facility] AND REHABILITATION Pending - Request Sent [address] Johnella Naas [place] [address] [phone]  [facility] Pending - Request Sent [address] [place] [address] [phone]  [facility]  Declined [address], [place] [address] [phone]  [facility] Bruce Caper [address] [place] [address] [phone]  [facility] Preferred SNF Declined Insurance not in network [address], [place] [address] [phone]  [facility] AND REHABILITATION [facility] Preferred SNF Declined [address] [place] [address] [phone]  [facility]IT[facility], [facility] Preferred SNF Declined No LTC [address], [place] [address] [phone]  [facility] SN[facility] Declined Cannot meet patient's needs [address], [place] [address] [phone]  [facility] [place] Preferred SNF Declined Cannot meet patient's needs [address] [place] [address] [phone]  [facility] Preferred SNF Declined Out of network [address] [place] [address] [phone]  [facility] [facility]ette [facility], [place] Preferred SNF Declined Cannot meet patient's needs [address] [place] [address] [phone]  [facility] SNF Declined [address] [place] [address] [phone]  [facility] Declined Cannot meet patient's needs [address] [place] [address] [phone]  [facility]F Declined [address], [place] [address] [phone]  [facility] SNF Declined Insurance not in network [address] [place] [address] [phone]  [facility] Preferred SNF Declined Out of network [address], [place] [address] [phone]  [facility] Preferred SNF Declined [address] [place] [address] [phone]  [facility] Preferred SNF Declined [address] [place] [address], [place] [address] [phone]  [facility] Preferred SNF Declined [address] [place] [address] [phone]  [facility] SNF Declined no LTC bed at this time, Out of  network [address] [place] [address] [phone]  [facility] SNF Declined Cannot meet patient's needs [address] [place], [place] [address] [phone]  [facility] SNF Declined no MALE bed [address] [place] [address] [phone]  [facility] Preferred SNF Declined [address] [place] [address] [phone]  [facility] SNF Declined [address] [place] [address] [phone]  [facility] SNF Declined Age [address] [place] [address] [phone]  [facility] AND [facility]HABILITATION OF  Preferred SNF Declined Age [address], [place] [address] [phone]  [facility] Declined [address] [place] [address] [phone]  [facility] SN[facility] Declined Cannot meet patient's needs [address] Charleen Conn [place] [address] [phone]  [facility] SNF Declined No bed availablity [address] [place] [address] [phone]  [facility] SN[facility] Declined Age [address] [place], [place] [address] [phone]  [facility] AND REHABILITATION CENTER OF [facility] SNF [facility] Preferred SNF Declined [address], [place] [address] [phone]  [facility] Kaylene [facility], [place] SNF Preferred SNF Declined Insurance not in network [address] [place] [address] [phone]  [facility] Preferred SNF Declined [address] [place] [address] [phone]  [facility], [place]. Preferred SNF Declined [address], [place] [address] [phone]  [facility] SNF Declined Age [address] [place] [address] [phone]  [facility] AND [facility] SNF Declined Cannot meet patient's needs [address], [place] [address] [phone]  [facility] for Nursing  and Rehabilitation Declined [address] [place] [address] [phone]  [facility] for Nursing and Rehabilitation Declined [address] [place] [address] [phone]  [facility] for Nursing and Rehabilitation Declined [address] Matt Song [place] [address] [phone]  [facility] for Nursing and Rehabilitation Declined [address], [place] [address] [phone]  [facility] for [facility] Rehabilitation Declined [address], [place] [address] [phone]  [facility] for [facility] Rehabilitation Declined [address], [place] [address] [phone]  [facility] AND REHABILITATION Declined [address], [place] [address] [phone]  [facility] for Nursing and Rehabilitation Declined [address] [place] [address] [phone]  [facility] for [facility] and Rehabilitation Declined [address] [place] [address] [phone]  [facility] for Nursing and Rehabilitation Declined [address] [place] [address] [phone]      Expected Discharge Plan: Skilled Nursing Facility Barriers to Discharge: Continued Medical Work up, English as a second language teacher, SNF Pending bed offer  Expected Discharge Plan and Services In-house Referral: Clinical Social Work   Post Acute Care Choice: Skilled Nursing Facility Living arrangements for the past 2 months: Single Family Home                                       Social Determinants of Health (SDOH) Interventions SDOH Screenings   Food Insecurity: No Food Insecurity (12/15/2023)  Recent Concern: Food Insecurity - Food Insecurity Present (12/01/2023)  Housing: Low Risk  (12/15/2023)  Transportation Needs: No Transportation Needs (12/15/2023)  Utilities: Not At Risk (12/15/2023)  Depression (PHQ2-9): Low Risk  (06/27/2020)  Recent  Concern: Depression (PHQ2-9) - Medium Risk (05/16/2020)  Tobacco Use: High Risk (12/13/2023)    Readmission Risk  Interventions    12/14/2023    2:24 PM  Readmission Risk Prevention Plan  Transportation Screening Complete  Medication Review (RN Care Manager) Complete  PCP or Specialist appointment within 3-5 days of discharge Complete  HRI or Home Care Consult Complete  SW Recovery Care/Counseling Consult Complete  Palliative Care Screening Not Applicable  Skilled Nursing Facility Complete

## 2023-12-29 NOTE — TOC Progression Note (Signed)
 Transition of Care [facility]) - Progression Note    Patient Details  Name: Luke Velasquez MRN: [id number] Date of Birth: 09-03-2000  Transition of Care [facility]) CM/SW Contact  Jannice Mends, LCSW Phone Number: 12/29/2023, 9:42 AM  Clinical Narrative:    [employer] liaison checking with their building [facility] [place]. CSW sent updated clinicals.   Expected Discharge Plan: Skilled Nursing Facility Barriers to Discharge: Continued Medical Work up, English as a second language teacher, SNF Pending bed offer  Expected Discharge Plan and Services In-house Referral: Clinical Social Work   Post Acute Care Choice: Skilled Nursing Facility Living arrangements for the past 2 months: Single Family Home                                       Social Determinants of Health (SDOH) Interventions SDOH Screenings   Food Insecurity: No Food Insecurity (12/15/2023)  Recent Concern: Food Insecurity - Food Insecurity Present (12/01/2023)  Housing: Low Risk  (12/15/2023)  Transportation Needs: No Transportation Needs (12/15/2023)  Utilities: Not At Risk (12/15/2023)  Depression (PHQ2-9): Low Risk  (06/27/2020)  Recent Concern: Depression (PHQ2-9) - Medium Risk (05/16/2020)  Tobacco Use: High Risk (12/13/2023)    Readmission Risk Interventions    12/14/2023    2:24 PM  Readmission Risk Prevention Plan  Transportation Screening Complete  Medication Review (RN Care Manager) Complete  PCP or Specialist appointment within 3-5 days of discharge Complete  HRI or Home Care Consult Complete  SW Recovery Care/Counseling Consult Complete  Palliative Care Screening Not Applicable  Skilled Nursing Facility Complete

## 2023-12-29 NOTE — Plan of Care (Signed)

## 2023-12-29 NOTE — Progress Notes (Signed)
 Pt is refusing labs stating I don't need them. Pt education provided. Mds are aware of refusals. No acute distress noted. Pt Iying bed eating and watching TV. RN will continue to monitor. VS updated in chart.

## 2023-12-29 NOTE — Plan of Care (Signed)
  Problem: Education: Goal: Knowledge of General Education information will improve Description: Including pain rating scale, medication(s)/side effects and non-pharmacologic comfort measures Outcome: Not Progressing   Problem: Health Behavior/Discharge Planning: Goal: Ability to manage health-related needs will improve Outcome: Not Progressing   Problem: Clinical Measurements: Goal: Ability to maintain clinical measurements within normal limits will improve Outcome: Not Progressing Goal: Will remain free from infection Outcome: Not Progressing Goal: Diagnostic test results will improve Outcome: Not Progressing Goal: Respiratory complications will improve Outcome: Not Progressing Goal: Cardiovascular complication will be avoided Outcome: Not Progressing   Problem: Activity: Goal: Risk for activity intolerance will decrease Outcome: Not Progressing   Problem: Nutrition: Goal: Adequate nutrition will be maintained Outcome: Not Progressing   Problem: Coping: Goal: Level of anxiety will decrease Outcome: Not Progressing   Problem: Elimination: Goal: Will not experience complications related to bowel motility Outcome: Not Progressing   Problem: Pain Managment: Goal: General experience of comfort will improve and/or be controlled Outcome: Not Progressing   Problem: Safety: Goal: Ability to remain free from injury will improve Outcome: Not Progressing   Problem: Skin Integrity: Goal: Risk for impaired skin integrity will decrease Outcome: Not Progressing

## 2023-12-29 NOTE — Progress Notes (Signed)
 OT Cancellation Note  Patient Details Name: Luke Velasquez MRN: [id number] DOB: 2001/01/23   Cancelled Treatment:    Reason Eval/Treat Not Completed: Fatigue/lethargy limiting ability to participate pt sleeping heavily on OT entry, did not awaken to voice. Will follow up at a later time for OT session.  Shireen Dory 12/29/2023, 1:35 PM
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
## 2023-12-29 NOTE — Plan of Care (Signed)
   Problem: Health Behavior/Discharge Planning: Goal: Ability to manage health-related needs will improve Outcome: Progressing   Problem: Clinical Measurements: Goal: Cardiovascular complication will be avoided Outcome: Progressing

## 2023-12-30 LAB — RETICULOCYTES
Immature Retic Fract: 26.4 % — ABNORMAL HIGH (ref 2.3–15.9)
RBC.: 3.97 MIL/uL — ABNORMAL LOW (ref 4.22–5.81)
Retic Count, Absolute: 98.9 10*3/uL (ref 19.0–186.0)
Retic Ct Pct: 2.5 % (ref 0.4–3.1)

## 2023-12-30 LAB — CBC
HCT: 33.8 % — ABNORMAL LOW (ref 39.0–52.0)
Hemoglobin: 10 g/dL — ABNORMAL LOW (ref 13.0–17.0)
MCH: 24.8 pg — ABNORMAL LOW (ref 26.0–34.0)
MCHC: 29.6 g/dL — ABNORMAL LOW (ref 30.0–36.0)
MCV: 83.9 fL (ref 80.0–100.0)
Platelets: 607 10*3/uL — ABNORMAL HIGH (ref 150–400)
RBC: 4.03 MIL/uL — ABNORMAL LOW (ref 4.22–5.81)
RDW: 22.6 % — ABNORMAL HIGH (ref 11.5–15.5)
WBC: 7.1 10*3/uL (ref 4.0–10.5)
nRBC: 0 % (ref 0.0–0.2)

## 2023-12-30 LAB — BASIC METABOLIC PANEL WITH GFR
Anion gap: 10 (ref 5–15)
BUN: 13 mg/dL (ref 6–20)
CO2: 26 mmol/L (ref 22–32)
Calcium: 9.3 mg/dL (ref 8.9–10.3)
Chloride: 103 mmol/L (ref 98–111)
Creatinine, Ser: 0.65 mg/dL (ref 0.61–1.24)
GFR, Estimated: >60 mL/min (ref >60)
Glucose, Bld: 89 mg/dL (ref 70–99)
Potassium: 3.8 mmol/L (ref 3.5–5.1)
Sodium: 139 mmol/L (ref 135–145)

## 2023-12-30 LAB — FOLATE: Folate: 7 ng/mL

## 2023-12-30 LAB — IRON AND TIBC
Iron: 49 ug/dL (ref 45–182)
Saturation Ratios: 16 % — ABNORMAL LOW (ref 17.9–39.5)
TIBC: 301 ug/dL (ref 250–450)
UIBC: 252 ug/dL

## 2023-12-30 LAB — TYPE AND SCREEN
ABO/RH(D): O POS
Antibody Screen: NEGATIVE

## 2023-12-30 LAB — FERRITIN: Ferritin: 168 ng/mL (ref 24–336)

## 2023-12-30 LAB — VITAMIN B12: Vitamin B-12: 204 pg/mL (ref 180–914)

## 2023-12-30 NOTE — Progress Notes (Addendum)
 PT Cancellation Note  Patient Details Name: Luke Velasquez MRN: [id number] DOB: Sep 09, 2000   Cancelled Treatment:    Reason Eval/Treat Not Completed: Patient declined, no reason specified(eating dinner, declined OOB to recliner. Will follow up at later date/time as schedule allows and pt able.)   Corbin Dess PT, DPT Acute Rehabilitation Services Office [phone]  12/30/23 4:32 PM
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
## 2023-12-30 NOTE — Plan of Care (Signed)

## 2023-12-31 MED ORDER — CYANOCOBALAMIN 1000 MCG/ML IJ SOLN
1000.0000 ug | INTRAMUSCULAR | Status: DC
  Filled 2023-12-31 (×3): qty 1

## 2023-12-31 MED ORDER — NICOTINE 21 MG/24HR TD PT24
21.0000 mg | MEDICATED_PATCH | Freq: Every day | TRANSDERMAL | Status: DC
  Administered 2023-12-31 – 2024-03-11 (×19): 21 mg via TRANSDERMAL
  Filled 2023-12-31 (×47): qty 1

## 2023-12-31 MED ORDER — NICOTINE POLACRILEX 2 MG MT GUM: 2.0000 mg | CHEWING_GUM | OROMUCOSAL | Status: DC | PRN

## 2023-12-31 MED ORDER — CYANOCOBALAMIN 1000 MCG/ML IJ SOLN
1000.0000 ug | Freq: Every day | INTRAMUSCULAR | Status: AC
  Administered 2023-12-31 – 2024-01-06 (×6): 1000 ug via SUBCUTANEOUS
  Filled 2023-12-31 (×7): qty 1

## 2023-12-31 NOTE — Plan of Care (Signed)
  Problem: Clinical Measurements: Goal: Will remain free from infection Outcome: Progressing   Problem: Pain Managment: Goal: General experience of comfort will improve and/or be controlled Outcome: Progressing   Problem: Nutrition: Goal: Adequate nutrition will be maintained Outcome: Progressing   Problem: Skin Integrity: Goal: Risk for impaired skin integrity will decrease Outcome: Progressing   Problem: Safety: Goal: Ability to remain free from injury will improve Outcome: Progressing

## 2023-12-31 NOTE — Plan of Care (Signed)
  Problem: Education: Goal: Knowledge of General Education information will improve Description: Including pain rating scale, medication(s)/side effects and non-pharmacologic comfort measures Outcome: Progressing   Problem: Clinical Measurements: Goal: Will remain free from infection Outcome: Progressing Goal: Diagnostic test results will improve Outcome: Progressing Goal: Cardiovascular complication will be avoided Outcome: Progressing   Problem: Activity: Goal: Risk for activity intolerance will decrease Outcome: Progressing   Problem: Coping: Goal: Level of anxiety will decrease Outcome: Progressing   Problem: Pain Managment: Goal: General experience of comfort will improve and/or be controlled Outcome: Progressing
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
## 2023-12-31 NOTE — Progress Notes (Signed)
 [facility] night cross cover note:   I added nicotine patch as well as prn Nicorette gum.     Camelia Cavalier, DO Hospitalist
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
## 2024-01-01 NOTE — Plan of Care (Signed)
  Problem: Education: Goal: Knowledge of General Education information will improve Description: Including pain rating scale, medication(s)/side effects and non-pharmacologic comfort measures Outcome: Progressing   Problem: Clinical Measurements: Goal: Ability to maintain clinical measurements within normal limits will improve Outcome: Progressing Goal: Will remain free from infection Outcome: Progressing   Problem: Coping: Goal: Level of anxiety will decrease Outcome: Progressing   Problem: Skin Integrity: Goal: Risk for impaired skin integrity will decrease Outcome: Progressing

## 2024-01-01 NOTE — Progress Notes (Signed)
 Patient refused repositioning throughout the day. RN educated about wound prevention and healing and patient stated he understands but still declines. He also refused multiple attempts to bath/CHG.

## 2024-01-01 NOTE — Plan of Care (Signed)

## 2024-01-02 NOTE — TOC Progression Note (Signed)
 Transition of Care [facility]) - Progression Note    Patient Details  Name: Luke Velasquez MRN: [id number] Date of Birth: 01/23/2001  [facility]) CM/SW Contact  Jannice Mends, LCSW Phone Number: 01/02/2024, 5:31 PM  Clinical Narrative:    Continuing to await SNF bed offer.    Expected Discharge Plan: Skilled Nursing Facility Barriers to Discharge: Continued Medical Work up, English as a second language teacher, SNF Pending bed offer  Expected Discharge Plan and Services In-house Referral: Clinical Social Work   Post Acute Care Choice: Skilled Nursing Facility Living arrangements for the past 2 months: Single Family Home                                       Social Determinants of Health (SDOH) Interventions SDOH Screenings   Food Insecurity: No Food Insecurity (12/15/2023)  Recent Concern: Food Insecurity - Food Insecurity Present (12/01/2023)  Housing: Low Risk  (12/15/2023)  Transportation Needs: No Transportation Needs (12/15/2023)  Utilities: Not At Risk (12/15/2023)  Depression (PHQ2-9): Low Risk  (06/27/2020)  Recent Concern: Depression (PHQ2-9) - Medium Risk (05/16/2020)  Tobacco Use: High Risk (12/13/2023)    Readmission Risk Interventions    12/14/2023    2:24 PM  Readmission Risk Prevention Plan  Transportation Screening Complete  Medication Review (RN Care Manager) Complete  PCP or Specialist appointment within 3-5 days of discharge Complete  HRI or Home Care Consult Complete  SW Recovery Care/Counseling Consult Complete  Palliative Care Screening Not Applicable  Skilled Nursing Facility Complete

## 2024-01-02 NOTE — Plan of Care (Signed)
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
## 2024-01-02 NOTE — Progress Notes (Signed)
 Patient refusing repositioning at this time. RN explained the importance of q2h turns in preventing and healing pressure injuries, patient states his understanding. RN attempted to boost patient in bed because the bottoms of his feet were touching the foot of the bed to which patient said no he does not want to be boosted and does not want his feet adjusted. RN explained that if his feet are touching the foot railing he can get new PI, patient states he does not care. He requested a pillow behind his back as only change of positioning which RN explained is not helping decrease pressure to sacral ulcers and patient stated he knows.
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
## 2024-01-03 NOTE — Plan of Care (Signed)
  Problem: Education: Goal: Knowledge of General Education information will improve Description: Including pain rating scale, medication(s)/side effects and non-pharmacologic comfort measures Outcome: Progressing   Problem: Health Behavior/Discharge Planning: Goal: Ability to manage health-related needs will improve Outcome: Progressing   Problem: Clinical Measurements: Goal: Diagnostic test results will improve Outcome: Progressing Goal: Respiratory complications will improve Outcome: Progressing Goal: Cardiovascular complication will be avoided Outcome: Progressing   Problem: Activity: Goal: Risk for activity intolerance will decrease Outcome: Progressing   Problem: Coping: Goal: Level of anxiety will decrease Outcome: Progressing
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
## 2024-01-03 NOTE — Plan of Care (Signed)
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
## 2024-01-04 NOTE — Plan of Care (Signed)
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
## 2024-01-05 NOTE — Progress Notes (Signed)
 Patient continues to refuse nursing care/ treatment. MD notified of continuous refusal of care.

## 2024-01-05 NOTE — TOC Progression Note (Signed)
 Transition of Care [facility]) - Progression Note    Patient Details  Name: Corban Kistler MRN: [id number] Date of Birth: 2001/04/26  Transition of Care [facility]) CM/SW Contact  Carmon Christen, LCSWA Phone Number: 01/05/2024, 3:46 PM  Clinical Narrative:     CSW spoke with Brittney who informed CSW that the [facility] unable to offer SNF bed for patient. Britney informed CSW she is awaiting to hear back from [facility] to see if they can offer SNF bed. [facility] is currently considering. CSW will continue to follow and assist with patients dc planning needs.  Expected Discharge Plan: Skilled Nursing Facility Barriers to Discharge: Continued Medical Work up, English as a second language teacher, SNF Pending bed offer  Expected Discharge Plan and Services In-house Referral: Clinical Social Work   Post Acute Care Choice: Skilled Nursing Facility Living arrangements for the past 2 months: Single Family Home                                       Social Determinants of Health (SDOH) Interventions SDOH Screenings   Food Insecurity: No Food Insecurity (12/15/2023)  Recent Concern: Food Insecurity - Food Insecurity Present (12/01/2023)  Housing: Low Risk  (12/15/2023)  Transportation Needs: No Transportation Needs (12/15/2023)  Utilities: Not At Risk (12/15/2023)  Depression (PHQ2-9): Low Risk  (06/27/2020)  Recent Concern: Depression (PHQ2-9) - Medium Risk (05/16/2020)  Tobacco Use: High Risk (12/13/2023)    Readmission Risk Interventions    12/14/2023    2:24 PM  Readmission Risk Prevention Plan  Transportation Screening Complete  Medication Review (RN Care Manager) Complete  PCP or Specialist appointment within 3-5 days of discharge Complete  HRI or Home Care Consult Complete  SW Recovery Care/Counseling Consult Complete  Palliative Care Screening Not Applicable  Skilled Nursing Facility Complete
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
## 2024-01-06 NOTE — Plan of Care (Signed)

## 2024-01-07 NOTE — Plan of Care (Signed)

## 2024-01-07 NOTE — Progress Notes (Signed)
 Pt found with cigarettes and lighter. Pt initially refuse to give them to primary nurse. This charge RN spoke with pt and explained he cannot have those items in the hospital and pt turn items over to RN. Placed in patients chart.
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
## 2024-01-07 NOTE — Plan of Care (Signed)
  Problem: Clinical Measurements: Goal: Diagnostic test results will improve Outcome: Progressing   Problem: Activity: Goal: Risk for activity intolerance will decrease Outcome: Progressing   Problem: Coping: Goal: Level of anxiety will decrease Outcome: Progressing   Problem: Safety: Goal: Ability to remain free from injury will improve Outcome: Progressing   

## 2024-01-07 NOTE — Progress Notes (Signed)
 Patient refused to take all of his morning medicines.No reason mentioned.

## 2024-01-08 DIAGNOSIS — R748 Abnormal levels of other serum enzymes: Secondary | ICD-10-CM | POA: Diagnosis not present

## 2024-01-08 DIAGNOSIS — R739 Hyperglycemia, unspecified: Secondary | ICD-10-CM

## 2024-01-08 DIAGNOSIS — A419 Sepsis, unspecified organism: Secondary | ICD-10-CM | POA: Diagnosis not present

## 2024-01-08 DIAGNOSIS — D649 Anemia, unspecified: Secondary | ICD-10-CM | POA: Diagnosis not present

## 2024-01-08 NOTE — Plan of Care (Signed)
  Problem: Clinical Measurements: Goal: Diagnostic test results will improve Outcome: Progressing Goal: Respiratory complications will improve Outcome: Progressing Goal: Cardiovascular complication will be avoided Outcome: Progressing   Problem: Nutrition: Goal: Adequate nutrition will be maintained Outcome: Progressing   Problem: Coping: Goal: Level of anxiety will decrease Outcome: Progressing   Problem: Safety: Goal: Ability to remain free from injury will improve Outcome: Progressing
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
## 2024-01-08 NOTE — Plan of Care (Signed)
   Problem: Health Behavior/Discharge Planning: Goal: Ability to manage health-related needs will improve Outcome: Progressing   Problem: Clinical Measurements: Goal: Ability to maintain clinical measurements within normal limits will improve Outcome: Progressing Goal: Will remain free from infection Outcome: Progressing Goal: Diagnostic test results will improve Outcome: Progressing Goal: Respiratory complications will improve Outcome: Progressing

## 2024-01-08 NOTE — Progress Notes (Signed)
 Patient refused to have any of his morning medicines.

## 2024-01-09 DIAGNOSIS — R739 Hyperglycemia, unspecified: Secondary | ICD-10-CM | POA: Diagnosis not present

## 2024-01-09 DIAGNOSIS — A419 Sepsis, unspecified organism: Secondary | ICD-10-CM | POA: Diagnosis not present

## 2024-01-09 DIAGNOSIS — D649 Anemia, unspecified: Secondary | ICD-10-CM | POA: Diagnosis not present

## 2024-01-09 DIAGNOSIS — R748 Abnormal levels of other serum enzymes: Secondary | ICD-10-CM | POA: Diagnosis not present

## 2024-01-09 NOTE — Progress Notes (Signed)
 Patient refused to have any of his morning medicines.

## 2024-01-09 NOTE — Plan of Care (Signed)
  Problem: Health Behavior/Discharge Planning: Goal: Ability to manage health-related needs will improve Outcome: Progressing   Problem: Clinical Measurements: Goal: Will remain free from infection Outcome: Progressing   Problem: Activity: Goal: Risk for activity intolerance will decrease Outcome: Progressing   Problem: Coping: Goal: Level of anxiety will decrease Outcome: Progressing   Problem: Skin Integrity: Goal: Risk for impaired skin integrity will decrease Outcome: Progressing
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
## 2024-01-09 NOTE — Progress Notes (Signed)
 Patient refused to have all his night medication

## 2024-01-09 NOTE — Plan of Care (Signed)
   Problem: Activity: Goal: Risk for activity intolerance will decrease Outcome: Progressing   Problem: Nutrition: Goal: Adequate nutrition will be maintained Outcome: Progressing   Problem: Coping: Goal: Level of anxiety will decrease Outcome: Progressing   Problem: Pain Managment: Goal: General experience of comfort will improve and/or be controlled Outcome: Progressing
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
## 2024-01-10 DIAGNOSIS — D649 Anemia, unspecified: Secondary | ICD-10-CM | POA: Diagnosis not present
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
## 2024-01-10 NOTE — Plan of Care (Signed)
  Problem: Education: Goal: Knowledge of General Education information will improve Description: Including pain rating scale, medication(s)/side effects and non-pharmacologic comfort measures Outcome: Progressing   Problem: Clinical Measurements: Goal: Will remain free from infection Outcome: Progressing   Problem: Coping: Goal: Level of anxiety will decrease Outcome: Progressing   Problem: Skin Integrity: Goal: Risk for impaired skin integrity will decrease Outcome: Progressing
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
## 2024-01-10 NOTE — Plan of Care (Signed)

## 2024-01-11 DIAGNOSIS — D649 Anemia, unspecified: Secondary | ICD-10-CM | POA: Diagnosis not present

## 2024-01-11 MED ORDER — JUVEN PO PACK
1.0000 | PACK | Freq: Two times a day (BID) | ORAL | Status: AC
  Administered 2024-01-15 – 2024-04-30 (×51): 1 via ORAL
  Filled 2024-01-11 (×84): qty 1

## 2024-01-11 NOTE — TOC Progression Note (Addendum)
 Transition of Care [facility]) - Progression Note    Patient Details  Name: Costas Sena MRN: [id number] Date of Birth: 10-03-2000  [facility] CM/SW Contact  Inocente GORMAN Kindle, LCSW Phone Number: 01/11/2024, 9:13 AM  Clinical Narrative:    Continues with no SNF bed offers. Requested guidance from [facility] Supervisor. APS has signed off of patient's case. CSW also sent referral to [facility] to review.    Expected Discharge Plan: Skilled Nursing Facility Barriers to Discharge: Continued Medical Work up, English as a second language teacher, SNF Pending bed offer  Expected Discharge Plan and Services In-house Referral: Clinical Social Work   Post Acute Care Choice: Skilled Nursing Facility Living arrangements for the past 2 months: Single Family Home                                       Social Determinants of Health (SDOH) Interventions SDOH Screenings   Food Insecurity: No Food Insecurity (12/15/2023)  Recent Concern: Food Insecurity - Food Insecurity Present (12/01/2023)  Housing: Low Risk  (12/15/2023)  Transportation Needs: No Transportation Needs (12/15/2023)  Utilities: Not At Risk (12/15/2023)  Depression (PHQ2-9): Low Risk  (06/27/2020)  Recent Concern: Depression (PHQ2-9) - Medium Risk (05/16/2020)  Tobacco Use: High Risk (12/13/2023)    Readmission Risk Interventions    12/14/2023    2:24 PM  Readmission Risk Prevention Plan  Transportation Screening Complete  Medication Review (RN Care Manager) Complete  PCP or Specialist appointment within 3-5 days of discharge Complete  HRI or Home Care Consult Complete  SW Recovery Care/Counseling Consult Complete  Palliative Care Screening Not Applicable  Skilled Nursing Facility Complete
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
## 2024-01-12 ENCOUNTER — Inpatient Hospital Stay (HOSPITAL_COMMUNITY)

## 2024-01-12 DIAGNOSIS — D649 Anemia, unspecified: Secondary | ICD-10-CM | POA: Diagnosis not present

## 2024-01-12 LAB — URINALYSIS, ROUTINE W REFLEX MICROSCOPIC
Bilirubin Urine: NEGATIVE
Glucose, UA: NEGATIVE mg/dL
Hgb urine dipstick: NEGATIVE
Ketones, ur: NEGATIVE mg/dL
Nitrite: POSITIVE — AB
Protein, ur: NEGATIVE mg/dL
Specific Gravity, Urine: 1.023 (ref 1.005–1.030)
WBC, UA: >50 WBC/hpf (ref 0–5)
pH: 6 (ref 5.0–8.0)

## 2024-01-12 LAB — GLUCOSE, CAPILLARY: Glucose-Capillary: 101 mg/dL — ABNORMAL HIGH (ref 70–99)

## 2024-01-12 NOTE — Plan of Care (Signed)
   Problem: Clinical Measurements: Goal: Ability to maintain clinical measurements within normal limits will improve Outcome: Progressing Goal: Will remain free from infection Outcome: Progressing Goal: Respiratory complications will improve Outcome: Progressing Goal: Cardiovascular complication will be avoided Outcome: Progressing

## 2024-01-12 NOTE — Progress Notes (Signed)
 Patient refused bath, meds, and labs. Education provided.

## 2024-01-12 NOTE — Progress Notes (Signed)
 Patient refused labs and blood cultures. Stated he did not want draws in his hand due to pain and would prefer to get labs done at 10AM on the dot.

## 2024-01-12 NOTE — Progress Notes (Signed)
 Pt refused wound care.

## 2024-01-12 NOTE — Progress Notes (Signed)
 Patient requested to be discharged, and asked for an ambulance ride home. Patient was educated, and physician was educated about discharge process and scheduling a non emergent ambulance ride.  Patient asked to have his wheelchair,and got to the elevator, charge nurse and primary nurse educated, and de-escalation was successful. Patient is agitated and irritable and is currently screaming inside his room. Patient refused all medications.
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
## 2024-01-13 DIAGNOSIS — D649 Anemia, unspecified: Secondary | ICD-10-CM

## 2024-01-13 DIAGNOSIS — A419 Sepsis, unspecified organism: Secondary | ICD-10-CM

## 2024-01-13 DIAGNOSIS — R739 Hyperglycemia, unspecified: Secondary | ICD-10-CM | POA: Diagnosis not present

## 2024-01-13 DIAGNOSIS — R748 Abnormal levels of other serum enzymes: Secondary | ICD-10-CM | POA: Diagnosis not present

## 2024-01-13 NOTE — Progress Notes (Signed)
 Patient refused all medications and lab draws be done today. Patient did allow exchange of foley catheter and was provided fresh ostomy supplies.

## 2024-01-13 NOTE — Plan of Care (Signed)
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
## 2024-01-13 NOTE — Progress Notes (Signed)
 PT Cancellation Note  Patient Details Name: Luke Velasquez MRN: [id number] DOB: [birth year]   Cancelled Treatment:    Reason Eval/Treat Not Completed: Other (comment) (MD requesting PT to hold for today while DC planning is being worked on. Per RN, pt tried to elope last night and has been agitated.)   [name] 01/13/2024, 3:29 PM

## 2024-01-13 NOTE — TOC Progression Note (Addendum)
 Transition of Care [facility]) - Progression Note    Patient Details  Name: Luke Velasquez MRN: [id number] Date of Birth: April 09, 2001  Transition of Care [facility]) CM/SW Contact  Inocente GORMAN Kindle, LCSW Phone Number: 01/13/2024, 8:53 AM  Clinical Narrative:    8:53am-[place] declined referral due to Substance use being on the [facility] though CSW explained patient has not had recent use. Per MD, patient asking about returning home. CSW will speak with the patient. TOC Leadership continuing to discuss referral as well.   12:47 PM-CSW met with patient who reports wanting to discharge and return home. CSW explained that patient's mother has expressed to the MD that she is not comfortable caring for patient anymore. Patient denied that. CSW asked patient to speak with his mother and let him know that [facility] will not take his wheelchair. CSW asked patient if he is willing to stay until Monday to give the team time to create a plan but he stated no. He requested CSW come back in the afternoon.   CSW updated Camilla with APS to see if she needs anything. CSW will speak with patient's mother as well.    2:19 PM-CSW spoke with patient's mother to discuss the plan. She stated she is not allowing patient to come home. CSW explained that patient does have capacity so if he chooses to come home, it is up to her to prevent him from doing so. CSW explained we cannot physically stop him from leaving. She stated she is going to pick up his wheelchair from the hospital to discourage him from being able to leave. She asked if CSW could refer him to [facility] for a housing program. CSW explained that is typically done outpatient and he would require a caregiver. She stated she would be able to arrange the caregiver if he got an apartment. CSW inquired with [place] with APS.   3:08 PM-CSW spoke with [facility] who stated they could not help patient as he does not have [employer] and additionally they do not have any funding for housing now.    Per [facility] Supervisor, [facility] has denied patient.     Contacted the following: -[employer]: Do not accept under age 56 -[facility]: Not accepting Medicaid -[facility]: Does not accept [employer] Medicaid -[facility]: Does not accept [employer] Medicaid -[facility] [facility] Does not accept [employer] Medicaid -[employer]: Does not accept [employer] Medicaid -[employer] [place]: Left vm for [employer]: Left vm -[facility]: left vm  -[facility] [place]: left vm   5:13 PM-CSWs met with patient and updated him that his mom is not comfortable accepting him back home. Patient stated, That is not my mom. My mom would let me back in. CSW asked what he meant by that statement and he replied, I don't know, that's not my mom. CSW asked if he could call his mom. He declined. He asked CSW to call an ambulance to take him home. CSW explained that CSW could not do so as it is not felt to be a safe discharge plan. He asked why not and CSW explained that he needs a caregiver and his mom does not want him back at the house. He stated he does not need a caregiver and is independent and can get in his wheelchair by himself. CSW asked who owned the house and the patient reported that he owns the house (this is not true per mother). CSW made patient aware that CSW cannot  aide in getting him home at this time. Patient did consent to CSW calling [facility] Medicaid to see if they have a housing program. He provided CSW with his cell number to follow up once he goes home: [phone].   CSW updated MD and RN. MD to consult psychiatry. CSW updated patient's mother who stated she will try to come see patient to see if that helps. She requested psych speak with her if they require info on patient's typical confusion and inability to be his own decision maker safely.    Expected Discharge Plan: Skilled Nursing Facility Barriers to  Discharge: Continued Medical Work up, English as a second language teacher, SNF Pending bed offer  Expected Discharge Plan and Services In-house Referral: Clinical Social Work   Post Acute Care Choice: Skilled Nursing Facility Living arrangements for the past 2 months: Single Family Home                                       Social Determinants of Health (SDOH) Interventions SDOH Screenings   Food Insecurity: No Food Insecurity (12/15/2023)  Recent Concern: Food Insecurity - Food Insecurity Present (12/01/2023)  Housing: Low Risk  (12/15/2023)  Transportation Needs: No Transportation Needs (12/15/2023)  Utilities: Not At Risk (12/15/2023)  Depression (PHQ2-9): Low Risk  (06/27/2020)  Recent Concern: Depression (PHQ2-9) - Medium Risk (05/16/2020)  Tobacco Use: High Risk (12/13/2023)    Readmission Risk Interventions    12/14/2023    2:24 PM  Readmission Risk Prevention Plan  Transportation Screening Complete  Medication Review (RN Care Manager) Complete  PCP or Specialist appointment within 3-5 days of discharge Complete  HRI or Home Care Consult Complete  SW Recovery Care/Counseling Consult Complete  Palliative Care Screening Not Applicable  Skilled Nursing Facility Complete

## 2024-01-14 DIAGNOSIS — R748 Abnormal levels of other serum enzymes: Secondary | ICD-10-CM | POA: Diagnosis not present

## 2024-01-14 DIAGNOSIS — F4323 Adjustment disorder with mixed anxiety and depressed mood: Secondary | ICD-10-CM | POA: Diagnosis not present

## 2024-01-14 DIAGNOSIS — A419 Sepsis, unspecified organism: Secondary | ICD-10-CM | POA: Diagnosis not present

## 2024-01-14 DIAGNOSIS — F32A Depression, unspecified: Secondary | ICD-10-CM | POA: Diagnosis not present

## 2024-01-14 DIAGNOSIS — D649 Anemia, unspecified: Secondary | ICD-10-CM | POA: Diagnosis not present

## 2024-01-14 DIAGNOSIS — R739 Hyperglycemia, unspecified: Secondary | ICD-10-CM | POA: Diagnosis not present
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
## 2024-01-14 NOTE — Plan of Care (Signed)
 No adverse change in patient condition. Will continue to monitor.
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
## 2024-01-15 DIAGNOSIS — D649 Anemia, unspecified: Secondary | ICD-10-CM | POA: Diagnosis not present

## 2024-01-15 LAB — URINE CULTURE: Culture: 40000 — AB
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
## 2024-01-16 DIAGNOSIS — D649 Anemia, unspecified: Secondary | ICD-10-CM | POA: Diagnosis not present
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
## 2024-01-16 NOTE — Progress Notes (Signed)
   01/16/24 1515  Mobility  Activity Refused mobility  Mobility Specialist Start Time (ACUTE ONLY) 1515   Mobility Specialist: Progress Note  Pt refused mobility session d/t I am trying to sleep - Received and left in bed with all needs met. Call bell within reach. MS will follow up as time permits. RN present.   Virgle Boards, BS Mobility Specialist Please contact via SecureChat or Rehab office at [phone].
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
## 2024-01-16 NOTE — TOC Progression Note (Signed)
 Transition of Care [facility]) - Progression Note    Patient Details  Name: Luke Velasquez MRN: [id number] Date of Birth: [birth year]  [facility] CM/SW Contact  Inocente GORMAN Kindle, LCSW Phone Number: 01/16/2024, 1:38 PM  Clinical Narrative:    CSW continuing to follow and search for SNF bed.    Expected Discharge Plan: Skilled Nursing Facility Barriers to Discharge: Continued Medical Work up, English as a second language teacher, SNF Pending bed offer  Expected Discharge Plan and Services In-house Referral: Clinical Social Work   Post Acute Care Choice: Skilled Nursing Facility Living arrangements for the past 2 months: Single Family Home                                       Social Determinants of Health (SDOH) Interventions SDOH Screenings   Food Insecurity: No Food Insecurity (12/15/2023)  Recent Concern: Food Insecurity - Food Insecurity Present (12/01/2023)  Housing: Low Risk  (12/15/2023)  Transportation Needs: No Transportation Needs (12/15/2023)  Utilities: Not At Risk (12/15/2023)  Depression (PHQ2-9): Low Risk  (06/27/2020)  Recent Concern: Depression (PHQ2-9) - Medium Risk (05/16/2020)  Tobacco Use: High Risk (12/13/2023)    Readmission Risk Interventions    12/14/2023    2:24 PM  Readmission Risk Prevention Plan  Transportation Screening Complete  Medication Review (RN Care Manager) Complete  PCP or Specialist appointment within 3-5 days of discharge Complete  HRI or Home Care Consult Complete  SW Recovery Care/Counseling Consult Complete  Palliative Care Screening Not Applicable  Skilled Nursing Facility Complete

## 2024-01-16 NOTE — Plan of Care (Signed)
 No adverse change in condition. Will continue to monitor.
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
## 2024-01-17 DIAGNOSIS — D649 Anemia, unspecified: Secondary | ICD-10-CM | POA: Diagnosis not present

## 2024-01-17 NOTE — Progress Notes (Signed)
 PT Cancellation Note  Patient Details Name: Luke Velasquez MRN: [id number] DOB: 06-16-2001   Cancelled Treatment:    Reason Eval/Treat Not Completed: Patient declined, no reason specified (Pt refused. Will follow up later.)   Azalee Weimer 01/17/2024, 3:24 PM

## 2024-01-17 NOTE — Plan of Care (Signed)
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
## 2024-01-17 NOTE — Progress Notes (Signed)
 OT Cancellation Note  Patient Details Name: Luke Velasquez MRN: [id number] DOB: 10/11/2000   Cancelled Treatment:    Reason Eval/Treat Not Completed: (P) Patient declined, no reason specified, Pt said he will do his own bed level exercises but declined OOB activity or working with therapy today, will return tomorrow.   [name] 01/17/2024, 1:53 PM

## 2024-01-18 DIAGNOSIS — D649 Anemia, unspecified: Secondary | ICD-10-CM | POA: Diagnosis not present

## 2024-01-18 NOTE — Plan of Care (Signed)

## 2024-01-18 NOTE — TOC Progression Note (Addendum)
 Transition of Care [facility]) - Progression Note    Patient Details  Name: Luke Velasquez MRN: [id number] Date of Birth: 27-Jan-2001  Transition of Care [facility]) CM/SW Contact  Inocente GORMAN Kindle, LCSW Phone Number: 01/18/2024, 8:18 AM  Clinical Narrative:    Barriers remain. SNF search continues.   CSW spoke with patient's mother and answered questions regarding if she wanted to pursue [facility] and provided info. She expressed concern that patient could be tied down to receive medical treatment if he does not have capacity to refuse. She stated that is why she put him on comfort care and she does not want him tied down for the rest of his life because it does not make sense for his condition. She is requesting patient be placed back on comfort care in the hospital-she is aware that hospice cannot follow him while he is in the hospital. CSW will discuss with the MD.    Expected Discharge Plan: Skilled Nursing Facility Barriers to Discharge: Continued Medical Work up, English as a second language teacher, SNF Pending bed offer  Expected Discharge Plan and Services In-house Referral: Clinical Social Work   Post Acute Care Choice: Skilled Nursing Facility Living arrangements for the past 2 months: Single Family Home                                       Social Determinants of Health (SDOH) Interventions SDOH Screenings   Food Insecurity: No Food Insecurity (12/15/2023)  Recent Concern: Food Insecurity - Food Insecurity Present (12/01/2023)  Housing: Low Risk  (12/15/2023)  Transportation Needs: No Transportation Needs (12/15/2023)  Utilities: Not At Risk (12/15/2023)  Depression (PHQ2-9): Low Risk  (06/27/2020)  Recent Concern: Depression (PHQ2-9) - Medium Risk (05/16/2020)  Tobacco Use: High Risk (12/13/2023)    Readmission Risk Interventions    12/14/2023    2:24 PM  Readmission Risk Prevention Plan  Transportation Screening Complete  Medication Review (RN Care Manager) Complete  PCP or  Specialist appointment within 3-5 days of discharge Complete  HRI or Home Care Consult Complete  SW Recovery Care/Counseling Consult Complete  Palliative Care Screening Not Applicable  Skilled Nursing Facility Complete
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
## 2024-01-19 DIAGNOSIS — D649 Anemia, unspecified: Secondary | ICD-10-CM | POA: Diagnosis not present

## 2024-01-19 NOTE — Progress Notes (Signed)
 PT Cancellation Note  Patient Details Name: Luke Velasquez MRN: [id number] DOB: 29-May-2001   Cancelled Treatment:    Reason Eval/Treat Not Completed: Patient declined, no reason specified (Pt refused once again. Will follow up next week.)   Tanaiya Kolarik 01/19/2024, 11:42 AM
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
## 2024-01-19 NOTE — Plan of Care (Signed)
 Patient appeared asleep throughout the day. Patient refused repositioning, CHG bath, foley care and dressing changes. Patient educated on PI prevention and healing. Educated on risk for CAUTI if foley care and CHG bath is not done. He states his understanding to both but still declines. Call bell and belongings in reach.  Problem: Education: Goal: Knowledge of General Education information will improve Description: Including pain rating scale, medication(s)/side effects and non-pharmacologic comfort measures Outcome: Progressing   Problem: Health Behavior/Discharge Planning: Goal: Ability to manage health-related needs will improve Outcome: Progressing   Problem: Clinical Measurements: Goal: Ability to maintain clinical measurements within normal limits will improve Outcome: Progressing Goal: Will remain free from infection Outcome: Progressing Goal: Diagnostic test results will improve Outcome: Progressing Goal: Respiratory complications will improve Outcome: Progressing Goal: Cardiovascular complication will be avoided Outcome: Progressing   Problem: Activity: Goal: Risk for activity intolerance will decrease Outcome: Progressing   Problem: Nutrition: Goal: Adequate nutrition will be maintained Outcome: Progressing   Problem: Coping: Goal: Level of anxiety will decrease Outcome: Progressing   Problem: Elimination: Goal: Will not experience complications related to bowel motility Outcome: Progressing   Problem: Pain Managment: Goal: General experience of comfort will improve and/or be controlled Outcome: Progressing   Problem: Safety: Goal: Ability to remain free from injury will improve Outcome: Progressing   Problem: Skin Integrity: Goal: Risk for impaired skin integrity will decrease Outcome: Progressing
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
## 2024-01-20 DIAGNOSIS — D649 Anemia, unspecified: Secondary | ICD-10-CM | POA: Diagnosis not present

## 2024-01-20 NOTE — Plan of Care (Signed)
  Problem: Education: Goal: Knowledge of General Education information will improve Description: Including pain rating scale, medication(s)/side effects and non-pharmacologic comfort measures Outcome: Not Progressing   Problem: Activity: Goal: Risk for activity intolerance will decrease Outcome: Not Progressing   Problem: Nutrition: Goal: Adequate nutrition will be maintained Outcome: Not Progressing   Problem: Pain Managment: Goal: General experience of comfort will improve and/or be controlled Outcome: Not Progressing

## 2024-01-20 NOTE — Plan of Care (Signed)
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
## 2024-01-21 DIAGNOSIS — D649 Anemia, unspecified: Secondary | ICD-10-CM | POA: Diagnosis not present
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
## 2024-01-21 NOTE — Plan of Care (Signed)
 No change

## 2024-01-21 NOTE — Plan of Care (Signed)
  Problem: Education: Goal: Knowledge of General Education information will improve Description: Including pain rating scale, medication(s)/side effects and non-pharmacologic comfort measures Outcome: Not Progressing   Problem: Activity: Goal: Risk for activity intolerance will decrease Outcome: Not Progressing   Problem: Safety: Goal: Ability to remain free from injury will improve Outcome: Not Progressing   Problem: Skin Integrity: Goal: Risk for impaired skin integrity will decrease Outcome: Not Progressing
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
## 2024-01-22 DIAGNOSIS — D649 Anemia, unspecified: Secondary | ICD-10-CM | POA: Diagnosis not present
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
## 2024-01-22 NOTE — Plan of Care (Signed)
 Patient is stable and did not want dressing changed.

## 2024-01-23 DIAGNOSIS — D649 Anemia, unspecified: Secondary | ICD-10-CM | POA: Diagnosis not present

## 2024-01-23 NOTE — Progress Notes (Signed)
 OT Cancellation Note  Patient Details Name: Luke Velasquez MRN: [id number] DOB: 2000/10/23   Cancelled Treatment:    Reason Eval/Treat Not Completed: Patient declined, no reason specified (Patient declined OT on earlier attempt and patient asked COTA to come back in an hour. COTA returned and patient would not respond with blanket covering head.)  Luke Velasquez 01/23/2024, 12:05 PM  Luke Velasquez, OTA Acute Rehabilitation Services  Office [phone]
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
## 2024-01-24 DIAGNOSIS — D649 Anemia, unspecified: Secondary | ICD-10-CM | POA: Diagnosis not present

## 2024-01-24 NOTE — Plan of Care (Signed)

## 2024-01-24 NOTE — Progress Notes (Signed)
 PT Cancellation Note  Patient Details Name: Luke Velasquez MRN: [id number] DOB: [birth year]   Cancelled Treatment:    Reason Eval/Treat Not Completed: Patient declined, no reason specified (Pt was sleep upon arrival. Pt initially agreeable to arm exercises however kept falling asleep. Requests PT to come back in hour.)   Mana Morison 01/24/2024, 2:07 PM
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
## 2024-01-24 NOTE — Progress Notes (Signed)
 Patient refused his meds, repositioning, CHG bath, foley care and dressing change. Patient educated on importance of dressing change and foley care.
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
## 2024-01-25 DIAGNOSIS — D649 Anemia, unspecified: Secondary | ICD-10-CM | POA: Diagnosis not present
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
## 2024-01-25 NOTE — Plan of Care (Signed)

## 2024-01-25 NOTE — NC FL2 (Signed)
 [place]  MEDICAID FL2 LEVEL OF CARE FORM     IDENTIFICATION  Patient Name: Luke Velasquez Birthdate: 07-04-2001 Sex: male Admission Date (Current Location): 12/13/2023  [facility] [place] Number:  Producer, television/film/video and Address:  [place] [address] [place], [place] [address]      Provider Number: [id number]  Attending Physician Name and Address:  Elgergawy, Brayton RAMAN, MD  Relative Name and Phone Number:       Current Level of Care: Hospital Recommended Level of Care: Skilled Nursing Facility Prior Approval Number:    Date Approved/Denied:   PASRR Number: [id number]  Discharge Plan: SNF    Current Diagnoses: Patient Active Problem List   Diagnosis Date Noted   Malnutrition of moderate degree 11/22/2023   Sepsis due to cellulitis (HCC) 11/21/2023   Symptomatic anemia 11/21/2023   Lactic acidosis 11/21/2023   Bipolar disorder (HCC) 11/21/2023   Impaired mobility 11/21/2023   Insomnia due to medical condition 06/25/2023   Sepsis secondary to UTI (HCC) 06/24/2023   Sacral decubitus ulcer 06/24/2023   Chronic osteomyelitis of pelvic region (HCC) 06/24/2023   Acute blood loss anemia 06/24/2023   History of DVT (deep vein thrombosis) 06/24/2023   Thrombocytosis 06/24/2023   Involuntary commitment 06/24/2023   Pressure injury of skin 02/14/2023   Protein-calorie malnutrition, severe (HCC) 02/14/2023   UTI (urinary tract infection) 02/14/2023   Drainage from wound 02/12/2023   PTSD (post-traumatic stress disorder) 12/07/2022   Psychosis (HCC) 08/06/2022   Polysubstance abuse (HCC) 08/06/2022   Impaired mobility and ADLs 03/10/2022   Colostomy in place [facility]) 05/16/2020   Paraplegia (HCC) 05/16/2020   Spasticity 05/16/2020   Neurogenic bladder 05/16/2020   Wheelchair dependence 05/16/2020    Orientation RESPIRATION BLADDER Height & Weight     Self, Time, Situation, Place  Normal Continent, Indwelling catheter Weight: 123 lb  7.3 oz (56 kg) Height:  6' 4 (193 cm)  BEHAVIORAL SYMPTOMS/MOOD NEUROLOGICAL BOWEL NUTRITION STATUS      Continent, Colostomy Diet (See dc summary)  AMBULATORY STATUS COMMUNICATION OF NEEDS Skin   Extensive Assist Verbally PU Stage and Appropriate Care (Stage II on hip,leg, abdomen, ankle,heel ; Stage III on buttocks and sacrum; Stage IV on hip and rectum; non pressure wound on pretibial)                       Personal Care Assistance Level of Assistance  Bathing, Feeding, Dressing Bathing Assistance: Maximum assistance Feeding assistance: Independent Dressing Assistance: Limited assistance     Functional Limitations Info             SPECIAL CARE FACTORS FREQUENCY  PT (By licensed PT), OT (By licensed OT)     PT Frequency: eval and treat OT Frequency: eval and treat            Contractures Contractures Info: Not present    Additional Factors Info  Code Status, Allergies, Isolation Precautions Code Status Info: Full Allergies Info: Ivp Dye (Iodinated Contrast Media)     Isolation Precautions Info: OMDRO     Current Medications (01/25/2024):  This is the current hospital active medication list Current Facility-Administered Medications  Medication Dose Route Frequency Provider Last Rate Last Admin   acetaminophen  (TYLENOL ) tablet 650 mg  650 mg Oral Q6H PRN Georgina Basket, MD       Chlorhexidine  Gluconate Cloth 2 % PADS 6 each  6 each Topical Daily Georgina Basket, MD   6 each at  01/18/24 2126   cyanocobalamin  (VITAMIN B12) injection 1,000 mcg  1,000 mcg Subcutaneous Q30 days Elgergawy, Dawood S, MD       LORazepam  (ATIVAN ) tablet 0.5 mg  0.5 mg Oral BID Georgina Basket, MD   0.5 mg at 01/24/24 2255   metoprolol  tartrate (LOPRESSOR ) injection 5 mg  5 mg Intravenous Q8H PRN Singh, Prashant K, MD   5 mg at 12/22/23 0030   mirtazapine  (REMERON ) tablet 15 mg  15 mg Oral QHS Luiz Channel, MD   15 mg at 01/24/24 2255   nicotine  (NICODERM CQ  - dosed in mg/24 hours) patch  21 mg  21 mg Transdermal Daily Howerter, Justin B, DO   21 mg at 01/22/24 0935   nicotine  polacrilex (NICORETTE ) gum 2 mg  2 mg Oral PRN Howerter, Justin B, DO       nutrition supplement (JUVEN) (JUVEN) powder packet 1 packet  1 packet Oral BID BM Raenelle Donalda HERO, MD   1 packet at 01/18/24 1024   oxyCODONE  (Oxy IR/ROXICODONE ) immediate release tablet 5 mg  5 mg Oral Q4H PRN Moore, Willie, MD   5 mg at 12/25/23 2132   traZODone  (DESYREL ) tablet 100 mg  100 mg Oral QHS McQuilla, Jai B, MD   100 mg at 01/24/24 2255     Discharge Medications: Please see discharge summary for a list of discharge medications.  Relevant Imaging Results:  Relevant Lab Results:   Additional Information SSN: [id number]  Inocente GORMAN Kindle, LCSW

## 2024-01-25 NOTE — TOC Progression Note (Addendum)
 Transition of Care [facility]) - Progression Note    Patient Details  Name: Luke Velasquez MRN: [id number] Date of Birth: 09/06/2000  [facility]) CM/SW Contact  Inocente GORMAN Kindle, LCSW Phone Number: 01/25/2024, 9:02 AM  Clinical Narrative:    CSW continuing to follow. No SNF bed offers.   [facility]: faxed referral to [place] at [phone] [facility]: Left vm -[facility]: Left vm -[place]: Left vm - Rehab: denied due to age   Expected Discharge Plan: Skilled Nursing Facility Barriers to Discharge: Continued Medical Work up, English as a second language teacher, SNF Pending bed offer  Expected Discharge Plan and Services In-house Referral: Clinical Social Work   Post Acute Care Choice: Skilled Nursing Facility Living arrangements for the past 2 months: Single Family Home                                       Social Determinants of Health (SDOH) Interventions SDOH Screenings   Food Insecurity: No Food Insecurity (12/15/2023)  Recent Concern: Food Insecurity - Food Insecurity Present (12/01/2023)  Housing: Low Risk  (12/15/2023)  Transportation Needs: No Transportation Needs (12/15/2023)  Utilities: Not At Risk (12/15/2023)  Depression (PHQ2-9): Low Risk  (06/27/2020)  Recent Concern: Depression (PHQ2-9) - Medium Risk (05/16/2020)  Tobacco Use: High Risk (12/13/2023)    Readmission Risk Interventions    12/14/2023    2:24 PM  Readmission Risk Prevention Plan  Transportation Screening Complete  Medication Review (RN Care Manager) Complete  PCP or Specialist appointment within 3-5 days of discharge Complete  HRI or Home Care Consult Complete  SW Recovery Care/Counseling Consult Complete  Palliative Care Screening Not Applicable  Skilled Nursing Facility Complete
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
## 2024-01-26 DIAGNOSIS — D649 Anemia, unspecified: Secondary | ICD-10-CM | POA: Diagnosis not present
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
## 2024-01-27 DIAGNOSIS — D649 Anemia, unspecified: Secondary | ICD-10-CM | POA: Diagnosis not present

## 2024-01-27 NOTE — Plan of Care (Signed)
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
## 2024-01-28 DIAGNOSIS — D649 Anemia, unspecified: Secondary | ICD-10-CM | POA: Diagnosis not present

## 2024-01-28 MED ORDER — BACITRACIN ZINC 500 UNIT/GM EX OINT
TOPICAL_OINTMENT | Freq: Three times a day (TID) | CUTANEOUS | Status: AC
  Administered 2024-01-28 – 2024-02-01 (×11): 31.5 via TOPICAL
  Filled 2024-01-28: qty 28.35
  Filled 2024-01-28: qty 28.4

## 2024-01-28 NOTE — Plan of Care (Signed)
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
## 2024-01-28 NOTE — TOC Progression Note (Signed)
 Transition of Care [facility]) - Progression Note    Patient Details  Name: Johnney Scarlata MRN: [id number] Date of Birth: [birth year]  Transition of Care [facility]) CM/SW Contact  Isaiah Public, LCSWA Phone Number: 01/28/2024, 4:31 PM  Clinical Narrative:     No SNF bed offers. CSW will continue to follow.  Expected Discharge Plan: Skilled Nursing Facility Barriers to Discharge: Continued Medical Work up, English as a second language teacher, SNF Pending bed offer  Expected Discharge Plan and Services In-house Referral: Clinical Social Work   Post Acute Care Choice: Skilled Nursing Facility Living arrangements for the past 2 months: Single Family Home                                       Social Determinants of Health (SDOH) Interventions SDOH Screenings   Food Insecurity: No Food Insecurity (12/15/2023)  Recent Concern: Food Insecurity - Food Insecurity Present (12/01/2023)  Housing: Low Risk  (12/15/2023)  Transportation Needs: No Transportation Needs (12/15/2023)  Utilities: Not At Risk (12/15/2023)  Depression (PHQ2-9): Low Risk  (06/27/2020)  Recent Concern: Depression (PHQ2-9) - Medium Risk (05/16/2020)  Tobacco Use: High Risk (12/13/2023)    Readmission Risk Interventions    12/14/2023    2:24 PM  Readmission Risk Prevention Plan  Transportation Screening Complete  Medication Review (RN Care Manager) Complete  PCP or Specialist appointment within 3-5 days of discharge Complete  HRI or Home Care Consult Complete  SW Recovery Care/Counseling Consult Complete  Palliative Care Screening Not Applicable  Skilled Nursing Facility Complete

## 2024-01-29 DIAGNOSIS — D649 Anemia, unspecified: Secondary | ICD-10-CM | POA: Diagnosis not present
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
## 2024-01-29 NOTE — Plan of Care (Signed)

## 2024-01-30 DIAGNOSIS — D649 Anemia, unspecified: Secondary | ICD-10-CM | POA: Diagnosis not present

## 2024-01-30 NOTE — Progress Notes (Signed)
 Pt arrived to 6 north room 31 alert and oriented x4. Pt in air mattress bed and experiencing no pain at this time. Bed in lowest position. Call light in reach. All needs met at this time.

## 2024-01-30 NOTE — TOC Progression Note (Signed)
 Transition of Care [facility]) - Progression Note    Patient Details  Name: Dael Howland MRN: [id number] Date of Birth: August 13, 2000  [facility] of [facility]) CM/SW Contact  Inocente GORMAN Kindle, LCSW Phone Number: 01/30/2024, 9:08 AM  Clinical Narrative:    CSW continuing to follow. No SNF bed offers.    Expected Discharge Plan: Skilled Nursing Facility Barriers to Discharge: Continued Medical Work up, English as a second language teacher, SNF Pending bed offer  Expected Discharge Plan and Services In-house Referral: Clinical Social Work   Post Acute Care Choice: Skilled Nursing Facility Living arrangements for the past 2 months: Single Family Home                                       Social Determinants of Health (SDOH) Interventions SDOH Screenings   Food Insecurity: No Food Insecurity (12/15/2023)  Recent Concern: Food Insecurity - Food Insecurity Present (12/01/2023)  Housing: Low Risk  (12/15/2023)  Transportation Needs: No Transportation Needs (12/15/2023)  Utilities: Not At Risk (12/15/2023)  Depression (PHQ2-9): Low Risk  (06/27/2020)  Recent Concern: Depression (PHQ2-9) - Medium Risk (05/16/2020)  Tobacco Use: High Risk (12/13/2023)    Readmission Risk Interventions    12/14/2023    2:24 PM  Readmission Risk Prevention Plan  Transportation Screening Complete  Medication Review (RN Care Manager) Complete  PCP or Specialist appointment within 3-5 days of discharge Complete  HRI or Home Care Consult Complete  SW Recovery Care/Counseling Consult Complete  Palliative Care Screening Not Applicable  Skilled Nursing Facility Complete
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
## 2024-01-31 DIAGNOSIS — D649 Anemia, unspecified: Secondary | ICD-10-CM | POA: Diagnosis not present
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
## 2024-01-31 NOTE — Progress Notes (Signed)
 PT Cancellation Note  Patient Details Name: Luke Velasquez MRN: [id number] DOB: 2001-05-26   Cancelled Treatment:    Reason Eval/Treat Not Completed: Other (comment) (This morning, pt declining to participate in therapies. In the afternoon, pt was mobilizing in his wheelchair unsupervised in the hallway. plan to follow-up when able.)  Leontine Hilt, SPT Acute Rehab [phone]   Leontine Hilt 01/31/2024, 3:54 PM

## 2024-01-31 NOTE — Progress Notes (Addendum)
 Patient allowed RN to change dressings as per orders.

## 2024-01-31 NOTE — Plan of Care (Signed)

## 2024-01-31 NOTE — Progress Notes (Signed)
Patient refused all AM meds.

## 2024-01-31 NOTE — Plan of Care (Signed)

## 2024-02-01 DIAGNOSIS — D649 Anemia, unspecified: Secondary | ICD-10-CM | POA: Diagnosis not present

## 2024-02-01 NOTE — Progress Notes (Signed)
 PT Cancellation Note  Patient Details Name: Luke Velasquez MRN: [id number] DOB: 2001-05-24   Cancelled Treatment:    Reason Eval/Treat Not Completed: (P) Fatigue/lethargy limiting ability to participate, pt sleeping on arrival, waking briefly and nodding yes that he would like to perform OOB mobility, however then asking this PTA to return in a few minutes and falling back asleep. Will check back as schedule allows in PM to continue with PT POC.  Therisa SAUNDERS. PTA Acute Rehabilitation Services Office: [phone]    Therisa CHRISTELLA Boor 02/01/2024, 12:16 PM

## 2024-02-01 NOTE — Progress Notes (Signed)
Patient refused all AM meds.
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
## 2024-02-01 NOTE — Plan of Care (Signed)

## 2024-02-02 DIAGNOSIS — D649 Anemia, unspecified: Secondary | ICD-10-CM | POA: Diagnosis not present

## 2024-02-02 NOTE — Plan of Care (Signed)
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
## 2024-02-02 NOTE — Plan of Care (Signed)
  Problem: Pain Managment: Goal: General experience of comfort will improve and/or be controlled Outcome: Progressing   Problem: Safety: Goal: Ability to remain free from injury will improve Outcome: Progressing

## 2024-02-03 DIAGNOSIS — D649 Anemia, unspecified: Secondary | ICD-10-CM | POA: Diagnosis not present

## 2024-02-03 NOTE — Plan of Care (Signed)
  Problem: Pain Managment: Goal: General experience of comfort will improve and/or be controlled Outcome: Progressing   Problem: Safety: Goal: Ability to remain free from injury will improve Outcome: Progressing
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
## 2024-02-03 NOTE — Progress Notes (Signed)
 Mobility Specialist Progress Note:    02/03/24 1615  Mobility  Activity Ambulated with assistance in hallway  Level of Assistance Modified independent, requires aide device or extra time  Assistive Device Wheelchair  Distance Ambulated (ft) 1000 ft  Activity Response Tolerated well  Mobility Referral Yes  Mobility visit 1 Mobility  Mobility Specialist Start Time (ACUTE ONLY) 1500  Mobility Specialist Stop Time (ACUTE ONLY) 1552  Mobility Specialist Time Calculation (min) (ACUTE ONLY) 52 min   Received pt in bed and agreeable to mobility. Pt transferred independently to w/c. Pt able to self propel w/c. MS took pt outside for a bit. Returned to room and left pt in w/c. Personal belongings and call light within reach. RN aware.  Lavanda Pollack Mobility Specialist  Please contact via [employer] or  Rehab Office [phone]

## 2024-02-03 NOTE — Progress Notes (Signed)
 Pt has refused peri-care and CHG bath for today.

## 2024-02-04 DIAGNOSIS — D649 Anemia, unspecified: Secondary | ICD-10-CM | POA: Diagnosis not present

## 2024-02-04 NOTE — Plan of Care (Signed)
  Problem: Pain Managment: Goal: General experience of comfort will improve and/or be controlled Outcome: Progressing   Problem: Safety: Goal: Ability to remain free from injury will improve Outcome: Progressing
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
## 2024-02-04 NOTE — Plan of Care (Signed)
  Problem: Pain Managment: Goal: General experience of comfort will improve and/or be controlled 02/04/2024 0249 by Fredda Honor Leash, RN Outcome: Progressing 02/04/2024 0231 by Fredda Honor Leash, RN Outcome: Progressing   Problem: Safety: Goal: Ability to remain free from injury will improve 02/04/2024 0249 by Fredda Honor Leash, RN Outcome: Progressing 02/04/2024 0231 by Fredda Honor Leash, RN Outcome: Progressing

## 2024-02-04 NOTE — Plan of Care (Signed)

## 2024-02-05 DIAGNOSIS — D649 Anemia, unspecified: Secondary | ICD-10-CM | POA: Diagnosis not present
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
## 2024-02-05 NOTE — Plan of Care (Signed)
  Problem: Education: Goal: Knowledge of General Education information will improve Description: Including pain rating scale, medication(s)/side effects and non-pharmacologic comfort measures Outcome: Progressing   Problem: Clinical Measurements: Goal: Ability to maintain clinical measurements within normal limits will improve Outcome: Progressing Goal: Will remain free from infection Outcome: Progressing Goal: Diagnostic test results will improve Outcome: Progressing   Problem: Nutrition: Goal: Adequate nutrition will be maintained Outcome: Progressing   Problem: Coping: Goal: Level of anxiety will decrease Outcome: Progressing   Problem: Safety: Goal: Ability to remain free from injury will improve Outcome: Progressing   Problem: Skin Integrity: Goal: Risk for impaired skin integrity will decrease Outcome: Progressing

## 2024-02-05 NOTE — Plan of Care (Signed)
   Problem: Education: Goal: Knowledge of General Education information will improve Description Including pain rating scale, medication(s)/side effects and non-pharmacologic comfort measures Outcome: Progressing

## 2024-02-06 DIAGNOSIS — F322 Major depressive disorder, single episode, severe without psychotic features: Secondary | ICD-10-CM | POA: Diagnosis not present

## 2024-02-06 DIAGNOSIS — D649 Anemia, unspecified: Secondary | ICD-10-CM | POA: Diagnosis not present

## 2024-02-06 DIAGNOSIS — F32A Depression, unspecified: Secondary | ICD-10-CM | POA: Diagnosis not present

## 2024-02-06 MED ORDER — FLUOXETINE HCL 20 MG PO CAPS
20.0000 mg | ORAL_CAPSULE | Freq: Every day | ORAL | Status: DC
  Administered 2024-02-06 – 2024-03-15 (×25): 20 mg via ORAL
  Filled 2024-02-06 (×33): qty 1

## 2024-02-06 NOTE — Progress Notes (Signed)
 Pt refused MD and RN to assess and change wounds. MD explained the risks of delaying care to his wounds. Requested a male Dr only. When asked for a reason. Pt stated he likes women not men.   When asked if I could assess ostomy. Pt touched his ostomy bag from over his shirt, felt it, and said its fine. Refused ostomy assessment. Stated he takes care of it on his own. Refused foley assessment and peri care.   Refused nursing to perform CHG bath. Stated to just bring wipes in.
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
## 2024-02-06 NOTE — Plan of Care (Signed)
   Problem: Education: Goal: Knowledge of General Education information will improve Description Including pain rating scale, medication(s)/side effects and non-pharmacologic comfort measures Outcome: Progressing
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
## 2024-02-07 DIAGNOSIS — D649 Anemia, unspecified: Secondary | ICD-10-CM | POA: Diagnosis not present

## 2024-02-07 NOTE — Plan of Care (Signed)
  Problem: Education: Goal: Knowledge of General Education information will improve Description: Including pain rating scale, medication(s)/side effects and non-pharmacologic comfort measures Outcome: Progressing   Problem: Health Behavior/Discharge Planning: Goal: Ability to manage health-related needs will improve Outcome: Progressing   Problem: Clinical Measurements: Goal: Ability to maintain clinical measurements within normal limits will improve Outcome: Progressing Goal: Will remain free from infection Outcome: Progressing Goal: Diagnostic test results will improve Outcome: Progressing Goal: Respiratory complications will improve Outcome: Progressing Goal: Cardiovascular complication will be avoided Outcome: Progressing   Problem: Activity: Goal: Risk for activity intolerance will decrease Outcome: Progressing   Problem: Nutrition: Goal: Adequate nutrition will be maintained Outcome: Progressing   Problem: Coping: Goal: Level of anxiety will decrease Outcome: Progressing   Problem: Pain Managment: Goal: General experience of comfort will improve and/or be controlled Outcome: Progressing   Problem: Elimination: Goal: Will not experience complications related to bowel motility Outcome: Progressing   Problem: Safety: Goal: Ability to remain free from injury will improve Outcome: Progressing   Problem: Skin Integrity: Goal: Risk for impaired skin integrity will decrease Outcome: Progressing
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
## 2024-02-07 NOTE — TOC Progression Note (Signed)
 Transition of Care [facility]) - Progression Note    Patient Details  Name: Luke Velasquez MRN: [id number] Date of Birth: Mar 16, 2001  Transition of [facility]) CM/SW Contact  Denver Harder [name] Moose, LCSW Phone Number: 02/07/2024, 3:58 PM  Clinical Narrative:    CSW called and left APS worker, [place] a voicemail   Expected Discharge Plan: Skilled Nursing Facility Barriers to Discharge: Continued Medical Work up, English as a second language teacher, SNF Pending bed offer               Expected Discharge Plan and Services In-house Referral: Clinical Social Work   Post Acute Care Choice: Skilled Nursing Facility Living arrangements for the past 2 months: Single Family Home                                       Social Drivers of Health (SDOH) Interventions SDOH Screenings   Food Insecurity: No Food Insecurity (12/15/2023)  Recent Concern: Food Insecurity - Food Insecurity Present (12/01/2023)  Housing: Low Risk  (12/15/2023)  Transportation Needs: No Transportation Needs (12/15/2023)  Utilities: Not At Risk (12/15/2023)  Depression (PHQ2-9): Low Risk  (06/27/2020)  Recent Concern: Depression (PHQ2-9) - Medium Risk (05/16/2020)  Tobacco Use: High Risk (12/13/2023)    Readmission Risk Interventions    12/14/2023    2:24 PM  Readmission Risk Prevention Plan  Transportation Screening Complete  Medication Review (RN Care Manager) Complete  PCP or Specialist appointment within 3-5 days of discharge Complete  HRI or Home Care Consult Complete  SW Recovery Care/Counseling Consult Complete  Palliative Care Screening Not Applicable  Skilled Nursing Facility Complete

## 2024-02-08 DIAGNOSIS — D649 Anemia, unspecified: Secondary | ICD-10-CM | POA: Diagnosis not present
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
## 2024-02-08 NOTE — Progress Notes (Signed)
 Patient refuses blood extraction.

## 2024-02-08 NOTE — Progress Notes (Signed)
 Patient prefers to have dressing change later.

## 2024-02-08 NOTE — Progress Notes (Signed)
 Brief Psychiatry Consult Note  The patient was last seen by the psychiatry service on 7/21 and was seen today. Interim documentation by primary team and nursing staff has been reviewed. At this time, patient is tolerating fluoxetine  without side effects. There is no evidence of acute psychiatric disturbance requiring ongoing psychiatric consultation. Please see last consult note for full assessment. Final medication recommendations are as follows:  -- Continue Remeron  15mg  at bedtime -- Continue Trazodone  100mg  at bedtime -- Continue Prozac  20 mg PO daily   We will sign off at this time. This has been communicated to the primary team. If issues arise in the future, don't hesitate to reconsult the Psychiatry Inpatient Consult Service.   Justino Cornish, MD

## 2024-02-08 NOTE — Plan of Care (Signed)
   Problem: Education: Goal: Knowledge of General Education information will improve Description Including pain rating scale, medication(s)/side effects and non-pharmacologic comfort measures Outcome: Progressing
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
## 2024-02-08 NOTE — Progress Notes (Signed)
Patient refused labs x2 

## 2024-02-08 NOTE — Plan of Care (Signed)
 Pt did allow us  to change all dressings today and bath, took meds.
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
## 2024-02-10 DIAGNOSIS — D649 Anemia, unspecified: Secondary | ICD-10-CM | POA: Diagnosis not present

## 2024-02-10 NOTE — Progress Notes (Signed)
 Pt refused drsg. changes at this time. Said that he would like RN to do it later. Will endorse to 1st shift RN.
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
## 2024-02-11 DIAGNOSIS — D649 Anemia, unspecified: Secondary | ICD-10-CM | POA: Diagnosis not present

## 2024-02-11 MED ORDER — ZINC SULFATE 220 (50 ZN) MG PO CAPS
220.0000 mg | ORAL_CAPSULE | Freq: Every day | ORAL | Status: DC
  Administered 2024-02-12 – 2024-05-09 (×33): 220 mg via ORAL
  Filled 2024-02-11 (×60): qty 1

## 2024-02-11 MED ORDER — MAGNESIUM OXIDE -MG SUPPLEMENT 400 (240 MG) MG PO TABS
400.0000 mg | ORAL_TABLET | Freq: Every day | ORAL | Status: DC
  Administered 2024-02-12 – 2024-03-12 (×18): 400 mg via ORAL
  Filled 2024-02-11 (×25): qty 1
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
## 2024-02-11 NOTE — Plan of Care (Signed)
  Problem: Education: Goal: Knowledge of General Education information will improve Description: Including pain rating scale, medication(s)/side effects and non-pharmacologic comfort measures Outcome: Not Progressing   Problem: Health Behavior/Discharge Planning: Goal: Ability to manage health-related needs will improve Outcome: Not Progressing   Problem: Clinical Measurements: Goal: Ability to maintain clinical measurements within normal limits will improve Outcome: Not Progressing Goal: Will remain free from infection Outcome: Not Progressing Goal: Diagnostic test results will improve Outcome: Not Progressing Goal: Respiratory complications will improve Outcome: Not Progressing Goal: Cardiovascular complication will be avoided Outcome: Not Progressing   Problem: Activity: Goal: Risk for activity intolerance will decrease Outcome: Not Progressing   Problem: Nutrition: Goal: Adequate nutrition will be maintained Outcome: Not Progressing   Problem: Coping: Goal: Level of anxiety will decrease Outcome: Not Progressing   Problem: Elimination: Goal: Will not experience complications related to bowel motility Outcome: Not Progressing   Problem: Pain Managment: Goal: General experience of comfort will improve and/or be controlled Outcome: Not Progressing   Problem: Safety: Goal: Ability to remain free from injury will improve Outcome: Not Progressing   Problem: Skin Integrity: Goal: Risk for impaired skin integrity will decrease Outcome: Not Progressing

## 2024-02-11 NOTE — Plan of Care (Signed)
°  Problem: Clinical Measurements: °Goal: Will remain free from infection °Outcome: Progressing °  °Problem: Activity: °Goal: Risk for activity intolerance will decrease °Outcome: Progressing °  °Problem: Nutrition: °Goal: Adequate nutrition will be maintained °Outcome: Progressing °  °Problem: Elimination: °Goal: Will not experience complications related to bowel motility °Outcome: Progressing °  °

## 2024-02-11 NOTE — Progress Notes (Signed)
 New order noted to insert foley catheter, order verified, per MD change existing foley catheter. Catheter changed by Lelon MASCOT without difficulty assisted by this writer.

## 2024-02-11 NOTE — Progress Notes (Signed)
 Patient refusing chlorhexidine  bath . Patient refusing care and meds. Mother came by to visit the patient and mother verbalized the patient will do refuse his care . Will continue to monitor.

## 2024-02-12 DIAGNOSIS — D649 Anemia, unspecified: Secondary | ICD-10-CM | POA: Diagnosis not present
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
## 2024-02-12 NOTE — Plan of Care (Signed)
 Pt sleeping most of the day, I was able to give meds  and do CHG wipes with him.

## 2024-02-13 DIAGNOSIS — D649 Anemia, unspecified: Secondary | ICD-10-CM | POA: Diagnosis not present
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
## 2024-02-13 NOTE — TOC Progression Note (Signed)
 Transition of Care [facility]) - Progression Note    Patient Details  Name: Caio Devera MRN: [id number] Date of Birth: Sep 24, 2000  Transition of [facility] CM/SW Contact  Haja Crego [name] Moose, LCSW Phone Number: 02/13/2024, 9:25 AM  Clinical Narrative:    CSW continuing to follow. No SNF bed offers.   Expected Discharge Plan: Skilled Nursing Facility Barriers to Discharge: Continued Medical Work up, English as a second language teacher, SNF Pending bed offer               Expected Discharge Plan and Services In-house Referral: Clinical Social Work   Post Acute Care Choice: Skilled Nursing Facility Living arrangements for the past 2 months: Single Family Home                                       Social Drivers of Health (SDOH) Interventions SDOH Screenings   Food Insecurity: No Food Insecurity (12/15/2023)  Recent Concern: Food Insecurity - Food Insecurity Present (12/01/2023)  Housing: Low Risk  (12/15/2023)  Transportation Needs: No Transportation Needs (12/15/2023)  Utilities: Not At Risk (12/15/2023)  Depression (PHQ2-9): Low Risk  (06/27/2020)  Recent Concern: Depression (PHQ2-9) - Medium Risk (05/16/2020)  Tobacco Use: High Risk (12/13/2023)    Readmission Risk Interventions    12/14/2023    2:24 PM  Readmission Risk Prevention Plan  Transportation Screening Complete  Medication Review (RN Care Manager) Complete  PCP or Specialist appointment within 3-5 days of discharge Complete  HRI or Home Care Consult Complete  SW Recovery Care/Counseling Consult Complete  Palliative Care Screening Not Applicable  Skilled Nursing Facility Complete

## 2024-02-13 NOTE — Plan of Care (Signed)
  Problem: Clinical Measurements: Goal: Ability to maintain clinical measurements within normal limits will improve Outcome: Progressing   Problem: Clinical Measurements: Goal: Will remain free from infection Outcome: Progressing   Problem: Nutrition: Goal: Adequate nutrition will be maintained Outcome: Progressing   

## 2024-02-14 DIAGNOSIS — D649 Anemia, unspecified: Secondary | ICD-10-CM | POA: Diagnosis not present
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
## 2024-02-14 NOTE — Plan of Care (Signed)

## 2024-02-14 NOTE — Plan of Care (Signed)

## 2024-02-15 DIAGNOSIS — D649 Anemia, unspecified: Secondary | ICD-10-CM | POA: Diagnosis not present

## 2024-02-15 NOTE — Plan of Care (Signed)
°  Problem: Clinical Measurements: Goal: Will remain free from infection Outcome: Progressing   Problem: Activity: Goal: Risk for activity intolerance will decrease Outcome: Progressing   Problem: Elimination: Goal: Will not experience complications related to bowel motility Outcome: Progressing
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
## 2024-02-16 DIAGNOSIS — D649 Anemia, unspecified: Secondary | ICD-10-CM | POA: Diagnosis not present

## 2024-02-16 NOTE — Plan of Care (Signed)
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
## 2024-02-17 DIAGNOSIS — D649 Anemia, unspecified: Secondary | ICD-10-CM | POA: Diagnosis not present
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
## 2024-02-17 NOTE — Plan of Care (Signed)
   Problem: Education: Goal: Knowledge of General Education information will improve Description: Including pain rating scale, medication(s)/side effects and non-pharmacologic comfort measures Outcome: Progressing   Problem: Activity: Goal: Risk for activity intolerance will decrease Outcome: Progressing   Problem: Nutrition: Goal: Adequate nutrition will be maintained Outcome: Progressing

## 2024-02-18 DIAGNOSIS — D649 Anemia, unspecified: Secondary | ICD-10-CM | POA: Diagnosis not present

## 2024-02-18 MED ORDER — CYANOCOBALAMIN 1000 MCG/ML IJ SOLN: 1000.0000 ug | INTRAMUSCULAR | Status: AC

## 2024-02-18 MED ORDER — FLUOXETINE HCL 20 MG PO CAPS: 20.0000 mg | ORAL_CAPSULE | Freq: Every day | ORAL | Status: AC

## 2024-02-18 MED ORDER — ZINC SULFATE 220 (50 ZN) MG PO CAPS: 220.0000 mg | ORAL_CAPSULE | Freq: Every day | ORAL | Status: AC

## 2024-02-18 MED ORDER — LORAZEPAM 0.5 MG PO TABS: 0.5000 mg | ORAL_TABLET | Freq: Two times a day (BID) | ORAL | 0 refills | Status: AC

## 2024-02-18 MED ORDER — OXYCODONE HCL 5 MG PO TABS: 5.0000 mg | ORAL_TABLET | Freq: Four times a day (QID) | ORAL | 0 refills | Status: AC | PRN

## 2024-02-18 MED ORDER — JUVEN PO PACK: 1.0000 | PACK | Freq: Two times a day (BID) | ORAL | Status: AC

## 2024-02-18 MED ORDER — TRAZODONE HCL 100 MG PO TABS: 100.0000 mg | ORAL_TABLET | Freq: Every day | ORAL | 0 refills | Status: AC

## 2024-02-18 MED ORDER — MIRTAZAPINE 15 MG PO TABS: 15.0000 mg | ORAL_TABLET | Freq: Every day | ORAL | Status: AC

## 2024-02-18 MED ORDER — MAGNESIUM OXIDE -MG SUPPLEMENT 400 (240 MG) MG PO TABS: 400.0000 mg | ORAL_TABLET | Freq: Every day | ORAL | Status: AC

## 2024-02-18 NOTE — Plan of Care (Signed)
  Problem: Clinical Measurements: Goal: Will remain free from infection Outcome: Progressing   Problem: Clinical Measurements: Goal: Diagnostic test results will improve Outcome: Progressing   Problem: Activity: Goal: Risk for activity intolerance will decrease Outcome: Progressing   Problem: Pain Managment: Goal: General experience of comfort will improve and/or be controlled Outcome: Progressing
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
## 2024-02-18 NOTE — TOC Progression Note (Signed)
 Transition of Care [facility]) - Progression Note    Patient Details  Name: Luke Velasquez MRN: [id number] Date of Birth: June 14, 2001  [facility] CM/SW Contact  Bridget Cordella Simmonds, LCSW Phone Number: 02/18/2024, 11:14 AM  Clinical Narrative:   Pt noted to have bed offer from [facility]F in [place].  Spoke to MD who confirms that SNF is still the DC plan, he was not aware pt had any SNF offers.  Last ICM note on 7/28 states no bed offers.  CSW informed pt of bed offer, gave him copy of [email or web address].gov listing.  Pt reports he would be OK with [facility]. CSW spoke with [name] by phone.  She had a lot of questions, reports she would like to speak with MD and also the weekday CSW.  Contact information for [facility] provided, she also wants to visit.  MD updated.    Expected Discharge Plan: Skilled Nursing Facility Barriers to Discharge: Continued Medical Work up, English as a second language teacher, SNF Pending bed offer               Expected Discharge Plan and Services In-house Referral: Clinical Social Work   Post Acute Care Choice: Skilled Nursing Facility Living arrangements for the past 2 months: Single Family Home                                       Social Drivers of Health (SDOH) Interventions SDOH Screenings   Food Insecurity: No Food Insecurity (12/15/2023)  Recent Concern: Food Insecurity - Food Insecurity Present (12/01/2023)  Housing: Low Risk  (12/15/2023)  Transportation Needs: No Transportation Needs (12/15/2023)  Utilities: Not At Risk (12/15/2023)  Depression (PHQ2-9): Low Risk  (06/27/2020)  Recent Concern: Depression (PHQ2-9) - Medium Risk (05/16/2020)  Tobacco Use: High Risk (12/13/2023)    Readmission Risk Interventions    12/14/2023    2:24 PM  Readmission Risk Prevention Plan  Transportation Screening Complete  Medication Review (RN Care Manager) Complete  PCP or Specialist appointment within 3-5 days of discharge Complete   HRI or Home Care Consult Complete  SW Recovery Care/Counseling Consult Complete  Palliative Care Screening Not Applicable  Skilled Nursing Facility Complete

## 2024-02-19 DIAGNOSIS — D649 Anemia, unspecified: Secondary | ICD-10-CM | POA: Diagnosis not present
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
## 2024-02-19 NOTE — Consult Note (Signed)
 WOC team re-consulted for multiple pressure injuries present on admission. See photos and WOC consult note 12/13/2023.  Wound care orders placed at that time. Patient reportedly noncompliant with care and order placed to re-evaluate wound. Secure chat to request updated photos.    Please note that the [facility] nursing team is utilizing a standardized work plan to manage patient consults. We are triaging consults and will try to see the patients within 48 hours. Wound photos in the patient's chart allow us  to consult on the patient in the most efficient and timely manner.    Thank you,    Powell Bar MSN, RN-BC, [employer]

## 2024-02-19 NOTE — Progress Notes (Signed)
 [facility] 6N[address] [facility] Liaison Note  Previous [facility] patient who revoked his Hospice Benefit.   Appears bed offer may be available at [facility] in [place].   If going for LTC we can resume hospice services, if going for SNF we can follow for outpatient palliative if desired.  Hospital Liaison will continue to follow for disposition.  Please call with questions or concerns.  Thank you, Randine Nail, BSN, [facility] [phone]

## 2024-02-19 NOTE — Plan of Care (Signed)

## 2024-02-20 DIAGNOSIS — D649 Anemia, unspecified: Secondary | ICD-10-CM | POA: Diagnosis not present

## 2024-02-20 NOTE — TOC Progression Note (Signed)
 Transition of Care [facility]) - Progression Note    Patient Details  Name: Luke Velasquez MRN: [id number] Date of Birth: March 27, 2001  [facility] of [facility]) CM/SW Contact  Blanche Scovell [name] Moose, LCSW Phone Number: 02/20/2024, 4:11 PM  Clinical Narrative:    CSW spoke with [facility] supervisor, Josefa Jes to discuss safe discharge plan for pt. Pt has one bed offer from [facility], CSW called to confirm bed offer with facility. [facility] initially stated they had a bed available for pt. CSW spoke informed TOC supervisor that [facility] has bed availability for pt. TOC supervisor advised CSW to secure chat MD with details for safe discharge plan. CSW messaged MD, MD agreeable to discharge plan and planned to discharge pt to facility tomorrow 8/5.   4:05- [facility] then informed CSW that DON will have to review and approve pt wound care before accepting him at facility.   CSW messaged MD and [facility] supervisor to inform them that DON has to approve wound care before accepting pt at facility. CSW awaiting to here back from facility about clinical acceptance before calling pt mom to inform her of safe discharge plan.   Expected Discharge Plan: Skilled Nursing Facility Barriers to Discharge: Continued Medical Work up, English as a second language teacher, SNF Pending bed offer               Expected Discharge Plan and Services In-house Referral: Clinical Social Work   Post Acute Care Choice: Skilled Nursing Facility Living arrangements for the past 2 months: Single Family Home                                       Social Drivers of Health (SDOH) Interventions SDOH Screenings   Food Insecurity: No Food Insecurity (12/15/2023)  Recent Concern: Food Insecurity - Food Insecurity Present (12/01/2023)  Housing: Low Risk  (12/15/2023)  Transportation Needs: No Transportation Needs (12/15/2023)  Utilities: Not At Risk (12/15/2023)  Depression (PHQ2-9): Low Risk  (06/27/2020)  Recent Concern: Depression  (PHQ2-9) - Medium Risk (05/16/2020)  Tobacco Use: High Risk (12/13/2023)    Readmission Risk Interventions    12/14/2023    2:24 PM  Readmission Risk Prevention Plan  Transportation Screening Complete  Medication Review (RN Care Manager) Complete  PCP or Specialist appointment within 3-5 days of discharge Complete  HRI or Home Care Consult Complete  SW Recovery Care/Counseling Consult Complete  Palliative Care Screening Not Applicable  Skilled Nursing Facility Complete

## 2024-02-20 NOTE — TOC Progression Note (Signed)
 Transition of Care [facility]) - Progression Note    Patient Details  Name: Luke Velasquez MRN: [id number] Date of Birth: Aug 25, 2000  Transition of Care [facility]) CM/SW Contact  Luke Ivery [name] Moose, LCSW Phone Number: 02/20/2024, 9:31 AM  Clinical Narrative:    CSW spoke with pt mom, Luke Velasquez, about SNF offer with [facility]. Luke Velasquez stated she plans to go tour the facility and speak with staff about what they offer before making a decision. Luke Velasquez asked CSW to speak with pt about SNF placement, CSW stated she will speak with him this afternoon. CSW went through the list of pending SNF offers with Luke Velasquez to inform her of the locations of those still pending. Luke Velasquez stated she has family in [place] and [place] so those could be considered as well if they did not like [facility]. CSW informed Luke Velasquez that they may not accept the referral but that CSW would reach out for their response just incase, Luke Velasquez understood and was agreeable. CSW provided Luke Velasquez with her phone number to keep each other updated. CSW will continue to follow.    Expected Discharge Plan: Skilled Nursing Facility Barriers to Discharge: Continued Medical Work up, English as a second language teacher, SNF Pending bed offer               Expected Discharge Plan and Services In-house Referral: Clinical Social Work   Post Acute Care Choice: Skilled Nursing Facility Living arrangements for the past 2 months: Single Family Home                                       Social Drivers of Health (SDOH) Interventions SDOH Screenings   Food Insecurity: No Food Insecurity (12/15/2023)  Recent Concern: Food Insecurity - Food Insecurity Present (12/01/2023)  Housing: Low Risk  (12/15/2023)  Transportation Needs: No Transportation Needs (12/15/2023)  Utilities: Not At Risk (12/15/2023)  Depression (PHQ2-9): Low Risk  (06/27/2020)  Recent Concern: Depression (PHQ2-9) - Medium Risk (05/16/2020)  Tobacco Use: High Risk (12/13/2023)     Readmission Risk Interventions    12/14/2023    2:24 PM  Readmission Risk Prevention Plan  Transportation Screening Complete  Medication Review (RN Care Manager) Complete  PCP or Specialist appointment within 3-5 days of discharge Complete  HRI or Home Care Consult Complete  SW Recovery Care/Counseling Consult Complete  Palliative Care Screening Not Applicable  Skilled Nursing Facility Complete

## 2024-02-20 NOTE — Consult Note (Addendum)
 WOC Nurse Consult Note: Reason for Consult: Requested to reassess multiple pressure injuries. Wound type: PI stage 4 on trochanters and sacrum. Legs do not have open wound.  Note: the wound on sacrum was not assessed in person, because the pt refuses to turn. Please, provide a photo.  Pressure Injury POA: Yes Measurement: R Trochanter 2.5 cm x 1.5 cm x 0.1 cm and L trochanter 1.5 x 0.5 cm. Wound bed: both wounds with 100% red granulation Drainage (amount, consistency, odor) Moderate amount, yellow exudate. Periwound: intact, white scar tissue. Dressing procedure/placement/frequency: Cleanse with Vashe [id number]. Pat dry the peri-wound skin, the wound bed needs to remain moist. Apply Aquacel #[id number] on the wound bed. Cover with foam dressing. Change every 2 days or PRN.  Bed nurse recommendations: - Turn and repositioning per hospital policy - Add a chair pressure retribution pad.   WOC team will not plan to follow further. Please reconsult if further assistance is needed. Thank-you,  Lela Holm BSN, CNS, RN, [employer], WOCN  (Phone [phone])
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
## 2024-02-21 DIAGNOSIS — D649 Anemia, unspecified: Secondary | ICD-10-CM | POA: Diagnosis not present
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
## 2024-02-21 NOTE — TOC Progression Note (Addendum)
 Transition of Care [facility]) - Progression Note    Patient Details  Name: Luke Velasquez MRN: [id number] Date of Birth: [birth year]  [facility] CM/SW Contact  Hunt Zajicek [name] Moose, LCSW Phone Number: 02/21/2024, 4:00 PM  Clinical Narrative:    CSW spoke with [place], liaison for [facility] and asked if DON had approved pt wounds for him to be admitted at [facility]. [place] informed CSW that pts wounds had been reviewed and he had been declined admission due to wound care. CSW updated [facility] leadership and contacted pt mother to provide her with an update but she did not answer. CSW left a voicemail asking her to call back and provided a callback number.    Expected Discharge Plan: Skilled Nursing Facility Barriers to Discharge: Continued Medical Work up, English as a second language teacher, SNF Pending bed offer               Expected Discharge Plan and Services In-house Referral: Clinical Social Work   Post Acute Care Choice: Skilled Nursing Facility Living arrangements for the past 2 months: Single Family Home                                       Social Drivers of Health (SDOH) Interventions SDOH Screenings   Food Insecurity: No Food Insecurity (12/15/2023)  Recent Concern: Food Insecurity - Food Insecurity Present (12/01/2023)  Housing: Low Risk  (12/15/2023)  Transportation Needs: No Transportation Needs (12/15/2023)  Utilities: Not At Risk (12/15/2023)  Depression (PHQ2-9): Low Risk  (06/27/2020)  Recent Concern: Depression (PHQ2-9) - Medium Risk (05/16/2020)  Tobacco Use: High Risk (12/13/2023)    Readmission Risk Interventions    12/14/2023    2:24 PM  Readmission Risk Prevention Plan  Transportation Screening Complete  Medication Review (RN Care Manager) Complete  PCP or Specialist appointment within 3-5 days of discharge Complete  HRI or Home Care Consult Complete  SW Recovery Care/Counseling Consult Complete  Palliative Care Screening Not Applicable   Skilled Nursing Facility Complete

## 2024-02-22 DIAGNOSIS — D649 Anemia, unspecified: Secondary | ICD-10-CM | POA: Diagnosis not present
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
## 2024-02-22 NOTE — Plan of Care (Signed)

## 2024-02-22 NOTE — Plan of Care (Signed)
 Pt pleasant today and agreeable.

## 2024-02-23 DIAGNOSIS — D649 Anemia, unspecified: Secondary | ICD-10-CM | POA: Diagnosis not present
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
## 2024-02-23 NOTE — Plan of Care (Signed)
  Problem: Education: Goal: Knowledge of General Education information will improve Description: Including pain rating scale, medication(s)/side effects and non-pharmacologic comfort measures Outcome: Progressing   Problem: Clinical Measurements: Goal: Ability to maintain clinical measurements within normal limits will improve Outcome: Progressing Goal: Diagnostic test results will improve Outcome: Progressing Goal: Respiratory complications will improve Outcome: Progressing Goal: Cardiovascular complication will be avoided Outcome: Progressing   Problem: Coping: Goal: Level of anxiety will decrease Outcome: Progressing   Problem: Elimination: Goal: Will not experience complications related to bowel motility Outcome: Progressing   Problem: Pain Managment: Goal: General experience of comfort will improve and/or be controlled Outcome: Progressing   Problem: Safety: Goal: Ability to remain free from injury will improve Outcome: Progressing

## 2024-02-24 DIAGNOSIS — D649 Anemia, unspecified: Secondary | ICD-10-CM | POA: Diagnosis not present

## 2024-02-24 NOTE — Progress Notes (Signed)
 Went into patient's room with doctor and NT. Patient refused all his AM meds. He kept his head covered. He stated that he wanted to do his CHG bath and foley care himself. He also stated that he did not want me to look at his wounds. I mentioned that we could look later at a time that he chose and he just said no.
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
## 2024-02-24 NOTE — Progress Notes (Signed)
 Attempted to administer medication to patient multiple times today and he refused and kept the blanket over his head

## 2024-02-24 NOTE — Plan of Care (Signed)

## 2024-02-24 NOTE — Plan of Care (Signed)
   Problem: Clinical Measurements: Goal: Will remain free from infection Outcome: Progressing   Problem: Safety: Goal: Ability to remain free from injury will improve Outcome: Progressing   Problem: Skin Integrity: Goal: Risk for impaired skin integrity will decrease Outcome: Progressing

## 2024-02-25 DIAGNOSIS — D649 Anemia, unspecified: Secondary | ICD-10-CM | POA: Diagnosis not present
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
## 2024-02-26 DIAGNOSIS — D649 Anemia, unspecified: Secondary | ICD-10-CM | POA: Diagnosis not present
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
## 2024-02-26 NOTE — Progress Notes (Signed)
 Patient allowed RN to assess him this morning, but would not take his medications this shift. Patient said he just wanted to be left alone.

## 2024-02-27 DIAGNOSIS — D649 Anemia, unspecified: Secondary | ICD-10-CM | POA: Diagnosis not present

## 2024-02-27 NOTE — Plan of Care (Signed)
  Problem: Health Behavior/Discharge Planning: Goal: Ability to manage health-related needs will improve Outcome: Not Progressing   Problem: Activity: Goal: Risk for activity intolerance will decrease Outcome: Progressing   Problem: Nutrition: Goal: Adequate nutrition will be maintained Outcome: Progressing   Problem: Elimination: Goal: Will not experience complications related to bowel motility Outcome: Progressing   Problem: Skin Integrity: Goal: Risk for impaired skin integrity will decrease Outcome: Not Progressing
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
## 2024-02-27 NOTE — Plan of Care (Signed)

## 2024-02-27 NOTE — Progress Notes (Addendum)
 2115 Refused full assessment & dressing change at this time. Pt education provided.  [id number] Pt refused RN changing his colostomy at this time.

## 2024-02-27 NOTE — Progress Notes (Signed)
 Pt turns self in bed
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
## 2024-02-28 DIAGNOSIS — D649 Anemia, unspecified: Secondary | ICD-10-CM | POA: Diagnosis not present
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
## 2024-02-28 NOTE — Progress Notes (Signed)
 Patient now comfort care

## 2024-02-28 NOTE — Progress Notes (Signed)
 RN told patient it was time for his medication patient stated armband is on the computer. RN scanned med pt wanted RN to leave the medication for him to take later. RN said she can not do that pt stated then throw meds away because he is not taking it. Pt removed patch and refused 10am nicotine  patch.

## 2024-02-28 NOTE — Plan of Care (Signed)
  Problem: Coping: Goal: Level of anxiety will decrease Outcome: Progressing   Problem: Pain Managment: Goal: General experience of comfort will improve and/or be controlled Outcome: Progressing   Problem: Safety: Goal: Ability to remain free from injury will improve Outcome: Progressing   Problem: Activity: Goal: Risk for activity intolerance will decrease Outcome: Not Progressing   Problem: Skin Integrity: Goal: Risk for impaired skin integrity will decrease Outcome: Not Progressing

## 2024-02-29 DIAGNOSIS — D649 Anemia, unspecified: Secondary | ICD-10-CM | POA: Diagnosis not present

## 2024-02-29 NOTE — Progress Notes (Signed)
   02/29/24 1226  Spiritual Encounters  Type of Visit Initial  Care provided to: Patient  Reason for visit Grief/loss  OnCall Visit No  Spiritual Framework  Presenting Themes Courage hope and growth  Patient Stress Factors Health changes;Major life changes;Loss of control  Family Stress Factors None identified  Interventions  Spiritual Care Interventions Made Compassionate presence;Established relationship of care and support;Reconciliation with self/others  Intervention Outcomes  Outcomes Reduced fear;Awareness of support;Awareness of health;Reduced anxiety   Chaplain met with Pt and engaged in conversation, sharing some of his own life experiences as the PT listened and reflected on his own.  Pt engaged in reflective conversation and appeared encouraged by the interaction.  Both the chaplain and Pt discussed football, with the Pt expressing enthusiasm about his favorite team  [employer]  [employer]   The Pt was excited and appreciative of the visit. Chaplain offered prayer for Pt, follow up will be provided per Pt request to continue spiritual support and encouragement.

## 2024-02-29 NOTE — Progress Notes (Signed)
 Patient refused morning medications, but he did allow us  to bath him, measure most of his wounds, change dressings, and change clothes.  We also moved him to room 23 d/t ants in his room and terminix having to treat his room.

## 2024-02-29 NOTE — Plan of Care (Signed)
   Problem: Education: Goal: Knowledge of General Education information will improve Description: Including pain rating scale, medication(s)/side effects and non-pharmacologic comfort measures 02/29/2024 2014 by Delores Kirsch, RN Outcome: Progressing 02/29/2024 2014 by Delores Kirsch, RN Outcome: Progressing 02/29/2024 2014 by Delores Kirsch, RN Outcome: Progressing 02/29/2024 2014 by Delores Kirsch, RN Outcome: Progressing   Problem: Health Behavior/Discharge Planning: Goal: Ability to manage health-related needs will improve 02/29/2024 2014 by Delores Kirsch, RN Outcome: Progressing 02/29/2024 2014 by Delores Kirsch, RN Outcome: Progressing

## 2024-02-29 NOTE — Plan of Care (Signed)
  Problem: Education: Goal: Knowledge of General Education information will improve Description: Including pain rating scale, medication(s)/side effects and non-pharmacologic comfort measures 02/29/2024 2014 by Delores Kirsch, RN Outcome: Progressing 02/29/2024 2014 by Delores Kirsch, RN Outcome: Progressing 02/29/2024 2014 by Delores Kirsch, RN Outcome: Progressing   Problem: Health Behavior/Discharge Planning: Goal: Ability to manage health-related needs will improve Outcome: Progressing

## 2024-02-29 NOTE — Plan of Care (Signed)
  Problem: Education: Goal: Knowledge of General Education information will improve Description: Including pain rating scale, medication(s)/side effects and non-pharmacologic comfort measures 02/29/2024 2014 by Delores Kirsch, RN Outcome: Progressing 02/29/2024 2014 by Delores Kirsch, RN Outcome: Progressing

## 2024-02-29 NOTE — Plan of Care (Signed)
   Problem: Education: Goal: Knowledge of General Education information will improve Description Including pain rating scale, medication(s)/side effects and non-pharmacologic comfort measures Outcome: Progressing

## 2024-02-29 NOTE — Plan of Care (Signed)
  Problem: Education: Goal: Knowledge of General Education information will improve Description: Including pain rating scale, medication(s)/side effects and non-pharmacologic comfort measures 02/29/2024 2014 by Delores Kirsch, RN Outcome: Progressing 02/29/2024 2014 by Delores Kirsch, RN Outcome: Progressing 02/29/2024 2014 by Delores Kirsch, RN Outcome: Progressing 02/29/2024 2014 by Delores Kirsch, RN Outcome: Progressing   Problem: Health Behavior/Discharge Planning: Goal: Ability to manage health-related needs will improve 02/29/2024 2014 by Delores Kirsch, RN Outcome: Progressing 02/29/2024 2014 by Delores Kirsch, RN Outcome: Progressing
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
## 2024-03-01 DIAGNOSIS — D649 Anemia, unspecified: Secondary | ICD-10-CM | POA: Diagnosis not present
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
## 2024-03-01 NOTE — Progress Notes (Signed)
   03/01/24 1128  Spiritual Encounters  Type of Visit Follow up  Care provided to: Patient  Reason for visit Routine spiritual support  OnCall Visit No  Spiritual Framework  Presenting Themes Meaning/purpose/sources of inspiration  Patient Stress Factors Health changes;Major life changes  Interventions  Spiritual Care Interventions Made Compassionate presence;Established relationship of care and support  Intervention Outcomes  Outcomes Awareness of support;Awareness around self/spiritual resourses   Chaplain stopped by to follow up with Pt, who was awake and resting in bed. Provided brief presence, engaged in light conversation about sports, and offered Pt some candy. Pt expressed gratitude. Chaplain explained intention to check in and allowed pt to rest.

## 2024-03-02 DIAGNOSIS — D649 Anemia, unspecified: Secondary | ICD-10-CM | POA: Diagnosis not present

## 2024-03-03 DIAGNOSIS — D649 Anemia, unspecified: Secondary | ICD-10-CM | POA: Diagnosis not present

## 2024-03-03 NOTE — Plan of Care (Signed)

## 2024-03-03 NOTE — TOC Progression Note (Signed)
 Transition of Care [facility]) - Progression Note    Patient Details  Name: Luke Velasquez MRN: [id number] Date of Birth: Aug 19, 2000  [facility] of [facility]) CM/SW Contact  Inocente GORMAN Kindle, LCSW Phone Number: 03/03/2024, 8:25 AM  Clinical Narrative:    ICM continuing to follow. Discharge barriers remain in place. Leadership aware.     Expected Discharge Plan: Skilled Nursing Facility Barriers to Discharge: Continued Medical Work up, English as a second language teacher, SNF Pending bed offer               Expected Discharge Plan and Services In-house Referral: Clinical Social Work   Post Acute Care Choice: Skilled Nursing Facility Living arrangements for the past 2 months: Single Family Home                                       Social Drivers of Health (SDOH) Interventions SDOH Screenings   Food Insecurity: No Food Insecurity (12/15/2023)  Recent Concern: Food Insecurity - Food Insecurity Present (12/01/2023)  Housing: Low Risk  (12/15/2023)  Transportation Needs: No Transportation Needs (12/15/2023)  Utilities: Not At Risk (12/15/2023)  Depression (PHQ2-9): Low Risk  (06/27/2020)  Recent Concern: Depression (PHQ2-9) - Medium Risk (05/16/2020)  Tobacco Use: High Risk (12/13/2023)    Readmission Risk Interventions    12/14/2023    2:24 PM  Readmission Risk Prevention Plan  Transportation Screening Complete  Medication Review (RN Care Manager) Complete  PCP or Specialist appointment within 3-5 days of discharge Complete  HRI or Home Care Consult Complete  SW Recovery Care/Counseling Consult Complete  Palliative Care Screening Not Applicable  Skilled Nursing Facility Complete

## 2024-03-03 NOTE — Plan of Care (Signed)
  Problem: Activity: Goal: Risk for activity intolerance will decrease Outcome: Progressing   Problem: Clinical Measurements: Goal: Cardiovascular complication will be avoided Outcome: Progressing   Problem: Clinical Measurements: Goal: Respiratory complications will improve Outcome: Progressing
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
## 2024-03-04 DIAGNOSIS — D649 Anemia, unspecified: Secondary | ICD-10-CM | POA: Diagnosis not present

## 2024-03-04 NOTE — Plan of Care (Signed)
   Problem: Education: Goal: Knowledge of General Education information will improve Description Including pain rating scale, medication(s)/side effects and non-pharmacologic comfort measures Outcome: Progressing   Problem: Activity: Goal: Risk for activity intolerance will decrease Outcome: Progressing   Problem: Safety: Goal: Ability to remain free from injury will improve Outcome: Progressing
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
## 2024-03-04 NOTE — Plan of Care (Signed)

## 2024-03-04 NOTE — Progress Notes (Signed)
 Refused AM medication-- will attempt again later on during the day when the patient is fully awake.

## 2024-03-04 NOTE — Progress Notes (Signed)
   03/04/24 1111  Spiritual Encounters  Type of Visit Attempt (pt unavailable)   Chaplain stopped by to follow up with Pt. Pt was sleeping at the time of visit. Chaplain will check back at a later time.

## 2024-03-05 DIAGNOSIS — R6520 Severe sepsis without septic shock: Secondary | ICD-10-CM | POA: Diagnosis not present

## 2024-03-05 DIAGNOSIS — D649 Anemia, unspecified: Secondary | ICD-10-CM | POA: Diagnosis not present

## 2024-03-05 DIAGNOSIS — A419 Sepsis, unspecified organism: Secondary | ICD-10-CM | POA: Diagnosis not present

## 2024-03-05 NOTE — Plan of Care (Signed)
   Problem: Health Behavior/Discharge Planning: Goal: Ability to manage health-related needs will improve Outcome: Progressing
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
## 2024-03-05 NOTE — Progress Notes (Signed)
 Pt refused all morning meds.

## 2024-03-06 DIAGNOSIS — A419 Sepsis, unspecified organism: Secondary | ICD-10-CM | POA: Diagnosis not present

## 2024-03-06 DIAGNOSIS — D649 Anemia, unspecified: Secondary | ICD-10-CM | POA: Diagnosis not present

## 2024-03-06 DIAGNOSIS — R6520 Severe sepsis without septic shock: Secondary | ICD-10-CM | POA: Diagnosis not present

## 2024-03-06 NOTE — Progress Notes (Signed)
 Pt refused wound care this morning.

## 2024-03-06 NOTE — Plan of Care (Signed)
  Problem: Health Behavior/Discharge Planning: Goal: Ability to manage health-related needs will improve Outcome: Progressing   Problem: Coping: Goal: Level of anxiety will decrease Outcome: Progressing   

## 2024-03-06 NOTE — Progress Notes (Signed)
 Pt refused morning assessment and dressing change. Management made aware

## 2024-03-06 NOTE — Progress Notes (Signed)
   03/06/24 0443  Vital Signs  Temp  (PT refused all vital signs RN Amy notified.)
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
## 2024-03-07 DIAGNOSIS — D649 Anemia, unspecified: Secondary | ICD-10-CM | POA: Diagnosis not present

## 2024-03-07 DIAGNOSIS — A419 Sepsis, unspecified organism: Secondary | ICD-10-CM | POA: Diagnosis not present

## 2024-03-07 DIAGNOSIS — R6520 Severe sepsis without septic shock: Secondary | ICD-10-CM | POA: Diagnosis not present
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
## 2024-03-07 NOTE — TOC Progression Note (Signed)
 Transition of Care [facility]) - Progression Note    Patient Details  Name: Luke Velasquez MRN: [id number] Date of Birth: 10-Oct-2000  [facility] CM/SW Contact  Lorcan Shelp [name] Mo[place], [place] Phone Number: 03/07/2024, 8:35 AM  Clinical Narrative:    Pt still does not have any bed offers, CSW has not heard back from pt mom. CSW will continue to follow.   Expected Discharge Plan: Skilled Nursing Facility Barriers to Discharge: Continued Medical Work up, English as a second language teacher, SNF Pending bed offer               Expected Discharge Plan and Services In-house Referral: Clinical Social Work   Post Acute Care Choice: Skilled Nursing Facility Living arrangements for the past 2 months: Single Family Home                                       Social Drivers of Health (SDOH) Interventions SDOH Screenings   Food Insecurity: No Food Insecurity (12/15/2023)  Recent Concern: Food Insecurity - Food Insecurity Present (12/01/2023)  Housing: Low Risk  (12/15/2023)  Transportation Needs: No Transportation Needs (12/15/2023)  Utilities: Not At Risk (12/15/2023)  Depression (PHQ2-9): Low Risk  (06/27/2020)  Recent Concern: Depression (PHQ2-9) - Medium Risk (05/16/2020)  Tobacco Use: High Risk (12/13/2023)    Readmission Risk Interventions    12/14/2023    2:24 PM  Readmission Risk Prevention Plan  Transportation Screening Complete  Medication Review (RN Care Manager) Complete  PCP or Specialist appointment within 3-5 days of discharge Complete  HRI or Home Care Consult Complete  SW Recovery Care/Counseling Consult Complete  Palliative Care Screening Not Applicable  Skilled Nursing Facility Complete

## 2024-03-08 DIAGNOSIS — R6520 Severe sepsis without septic shock: Secondary | ICD-10-CM | POA: Diagnosis not present

## 2024-03-08 DIAGNOSIS — D649 Anemia, unspecified: Secondary | ICD-10-CM | POA: Diagnosis not present

## 2024-03-08 DIAGNOSIS — A419 Sepsis, unspecified organism: Secondary | ICD-10-CM | POA: Diagnosis not present
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
## 2024-03-08 NOTE — Plan of Care (Signed)
  Problem: Health Behavior/Discharge Planning: Goal: Ability to manage health-related needs will improve Outcome: Progressing   Problem: Nutrition: Goal: Adequate nutrition will be maintained Outcome: Progressing   Problem: Skin Integrity: Goal: Risk for impaired skin integrity will decrease Outcome: Progressing   

## 2024-03-08 NOTE — Plan of Care (Signed)
  Problem: Health Behavior/Discharge Planning: Goal: Ability to manage health-related needs will improve Outcome: Progressing   Problem: Pain Managment: Goal: General experience of comfort will improve and/or be controlled Outcome: Progressing   Problem: Safety: Goal: Ability to remain free from injury will improve Outcome: Progressing

## 2024-03-09 DIAGNOSIS — D649 Anemia, unspecified: Secondary | ICD-10-CM | POA: Diagnosis not present

## 2024-03-09 DIAGNOSIS — A419 Sepsis, unspecified organism: Secondary | ICD-10-CM | POA: Diagnosis not present

## 2024-03-09 DIAGNOSIS — R6520 Severe sepsis without septic shock: Secondary | ICD-10-CM | POA: Diagnosis not present

## 2024-03-09 NOTE — Plan of Care (Signed)
   Problem: Health Behavior/Discharge Planning: Goal: Ability to manage health-related needs will improve Outcome: Progressing   Problem: Nutrition: Goal: Adequate nutrition will be maintained Outcome: Progressing

## 2024-03-09 NOTE — Plan of Care (Deleted)
   Problem: Health Behavior/Discharge Planning: Goal: Ability to manage health-related needs will improve Outcome: Progressing
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
## 2024-03-10 DIAGNOSIS — R6520 Severe sepsis without septic shock: Secondary | ICD-10-CM | POA: Diagnosis not present

## 2024-03-10 DIAGNOSIS — A419 Sepsis, unspecified organism: Secondary | ICD-10-CM | POA: Diagnosis not present

## 2024-03-10 DIAGNOSIS — D649 Anemia, unspecified: Secondary | ICD-10-CM | POA: Diagnosis not present
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
## 2024-03-10 NOTE — Progress Notes (Signed)
 Recieved patient with his colostomy open, stool scattered around his bed. Patient changed his colostomy but refuse to remove his soiled clothing. This Clinical research associate encouraged him to removed his soiled pants and offered to change wound dressing but patient refused. All linens changed.

## 2024-03-11 DIAGNOSIS — A419 Sepsis, unspecified organism: Secondary | ICD-10-CM | POA: Diagnosis not present

## 2024-03-11 DIAGNOSIS — D649 Anemia, unspecified: Secondary | ICD-10-CM | POA: Diagnosis not present

## 2024-03-11 DIAGNOSIS — R6520 Severe sepsis without septic shock: Secondary | ICD-10-CM | POA: Diagnosis not present

## 2024-03-11 NOTE — Plan of Care (Signed)
?  Problem: Clinical Measurements: ?Goal: Respiratory complications will improve ?Outcome: Progressing ?  ?Problem: Activity: ?Goal: Risk for activity intolerance will decrease ?Outcome: Progressing ?  ?Problem: Nutrition: ?Goal: Adequate nutrition will be maintained ?Outcome: Progressing ?  ?Problem: Coping: ?Goal: Level of anxiety will decrease ?Outcome: Progressing ?  ?
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
## 2024-03-11 NOTE — Plan of Care (Signed)
  Problem: Health Behavior/Discharge Planning: Goal: Ability to manage health-related needs will improve Outcome: Progressing   Problem: Clinical Measurements: Goal: Ability to maintain clinical measurements within normal limits will improve Outcome: Progressing   Problem: Activity: Goal: Risk for activity intolerance will decrease Outcome: Not Progressing   Problem: Coping: Goal: Level of anxiety will decrease Outcome: Progressing   Problem: Elimination: Goal: Will not experience complications related to bowel motility Outcome: Progressing   Problem: Pain Managment: Goal: General experience of comfort will improve and/or be controlled Outcome: Progressing

## 2024-03-11 NOTE — Progress Notes (Signed)
 Pt states that he will do his own foley care.  Refuses to let nursing staff do foley care on him.

## 2024-03-12 DIAGNOSIS — R6520 Severe sepsis without septic shock: Secondary | ICD-10-CM | POA: Diagnosis not present

## 2024-03-12 DIAGNOSIS — D649 Anemia, unspecified: Secondary | ICD-10-CM | POA: Diagnosis not present

## 2024-03-12 DIAGNOSIS — A419 Sepsis, unspecified organism: Secondary | ICD-10-CM | POA: Diagnosis not present
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
## 2024-03-12 NOTE — Plan of Care (Signed)
   Problem: Health Behavior/Discharge Planning: Goal: Ability to manage health-related needs will improve Outcome: Progressing   Problem: Activity: Goal: Risk for activity intolerance will decrease Outcome: Progressing   Problem: Coping: Goal: Level of anxiety will decrease Outcome: Progressing   Problem: Skin Integrity: Goal: Risk for impaired skin integrity will decrease Outcome: Progressing

## 2024-03-12 NOTE — TOC Progression Note (Signed)
 Transition of Care [facility]) - Progression Note    Patient Details  Name: Luke Velasquez MRN: [id number] Date of Birth: [birth year]  Transition of Care [facility] CM/SW Contact  [name], [place] Phone Number: 03/12/2024, 4:25 PM  Clinical Narrative:    CSW continuing to follow, still no bed offers and have not heard back from pt mom.    Expected Discharge Plan: Skilled Nursing Facility Barriers to Discharge: Continued Medical Work up, English as a second language teacher, SNF Pending bed offer               Expected Discharge Plan and Services In-house Referral: Clinical Social Work   Post Acute Care Choice: Skilled Nursing Facility Living arrangements for the past 2 months: Single Family Home                                       Social Drivers of Health (SDOH) Interventions SDOH Screenings   Food Insecurity: No Food Insecurity (12/15/2023)  Recent Concern: Food Insecurity - Food Insecurity Present (12/01/2023)  Housing: Low Risk  (12/15/2023)  Transportation Needs: No Transportation Needs (12/15/2023)  Utilities: Not At Risk (12/15/2023)  Depression (PHQ2-9): Low Risk  (06/27/2020)  Recent Concern: Depression (PHQ2-9) - Medium Risk (05/16/2020)  Tobacco Use: High Risk (12/13/2023)    Readmission Risk Interventions    12/14/2023    2:24 PM  Readmission Risk Prevention Plan  Transportation Screening Complete  Medication Review (RN Care Manager) Complete  PCP or Specialist appointment within 3-5 days of discharge Complete  HRI or Home Care Consult Complete  SW Recovery Care/Counseling Consult Complete  Palliative Care Screening Not Applicable  Skilled Nursing Facility Complete

## 2024-03-12 NOTE — Plan of Care (Signed)
   Problem: Nutrition: Goal: Adequate nutrition will be maintained Outcome: Progressing

## 2024-03-13 DIAGNOSIS — R6520 Severe sepsis without septic shock: Secondary | ICD-10-CM | POA: Diagnosis not present

## 2024-03-13 DIAGNOSIS — D649 Anemia, unspecified: Secondary | ICD-10-CM | POA: Diagnosis not present

## 2024-03-13 DIAGNOSIS — A419 Sepsis, unspecified organism: Secondary | ICD-10-CM | POA: Diagnosis not present
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
## 2024-03-14 DIAGNOSIS — D649 Anemia, unspecified: Secondary | ICD-10-CM | POA: Diagnosis not present

## 2024-03-14 MED ORDER — NICOTINE 14 MG/24HR TD PT24
14.0000 mg | MEDICATED_PATCH | Freq: Every day | TRANSDERMAL | Status: DC
  Filled 2024-03-14: qty 1

## 2024-03-14 NOTE — Plan of Care (Signed)
   Problem: Activity: Goal: Risk for activity intolerance will decrease Outcome: Progressing   Problem: Nutrition: Goal: Adequate nutrition will be maintained Outcome: Progressing   Problem: Coping: Goal: Level of anxiety will decrease Outcome: Progressing   Problem: Elimination: Goal: Will not experience complications related to bowel motility Outcome: Progressing
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
## 2024-03-14 NOTE — Plan of Care (Signed)
  Problem: Pain Managment: Goal: General experience of comfort will improve and/or be controlled Outcome: Progressing   Problem: Activity: Goal: Risk for activity intolerance will decrease Outcome: Not Progressing   Problem: Nutrition: Goal: Adequate nutrition will be maintained Outcome: Not Progressing   Problem: Skin Integrity: Goal: Risk for impaired skin integrity will decrease Outcome: Not Progressing

## 2024-03-15 DIAGNOSIS — D649 Anemia, unspecified: Secondary | ICD-10-CM | POA: Diagnosis not present
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
## 2024-03-15 NOTE — Progress Notes (Signed)
 Went into patient's room multiple times and asked him if he wanted his medication. Around 12:45 pm he agreed to take his morning medication. He refused for me to change his dressings or empty his urinal or his ostomy bag.

## 2024-03-15 NOTE — Plan of Care (Signed)
   Problem: Education: Goal: Knowledge of General Education information will improve Description Including pain rating scale, medication(s)/side effects and non-pharmacologic comfort measures Outcome: Progressing   Problem: Health Behavior/Discharge Planning: Goal: Ability to manage health-related needs will improve Outcome: Progressing

## 2024-03-16 DIAGNOSIS — D649 Anemia, unspecified: Secondary | ICD-10-CM | POA: Diagnosis not present

## 2024-03-16 NOTE — Progress Notes (Signed)
 Patient refused medication and assessment today.

## 2024-03-16 NOTE — Plan of Care (Signed)
   Problem: Nutrition: Goal: Adequate nutrition will be maintained Outcome: Progressing   Problem: Pain Managment: Goal: General experience of comfort will improve and/or be controlled Outcome: Progressing   Problem: Safety: Goal: Ability to remain free from injury will improve Outcome: Progressing
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
## 2024-03-17 DIAGNOSIS — D649 Anemia, unspecified: Secondary | ICD-10-CM | POA: Diagnosis not present
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
## 2024-03-17 NOTE — Plan of Care (Signed)
  Problem: Pain Managment: Goal: General experience of comfort will improve and/or be controlled Outcome: Progressing   Problem: Safety: Goal: Ability to remain free from injury will improve Outcome: Progressing   Problem: Skin Integrity: Goal: Risk for impaired skin integrity will decrease Outcome: Progressing

## 2024-03-18 DIAGNOSIS — D649 Anemia, unspecified: Secondary | ICD-10-CM | POA: Diagnosis not present

## 2024-03-18 MED ORDER — ACETAMINOPHEN 325 MG PO TABS
650.0000 mg | ORAL_TABLET | ORAL | Status: AC | PRN
  Administered 2024-05-10 – 2024-07-31 (×2): 650 mg via ORAL
  Filled 2024-03-18 (×5): qty 2

## 2024-03-18 MED ORDER — LORAZEPAM 0.5 MG PO TABS
0.5000 mg | ORAL_TABLET | Freq: Every day | ORAL | Status: DC
  Administered 2024-03-20 – 2024-05-04 (×16): 0.5 mg via ORAL
  Filled 2024-03-18 (×39): qty 1
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
## 2024-03-18 NOTE — TOC Progression Note (Signed)
 Transition of Care [facility]) - Progression Note    Patient Details  Name: Luke Velasquez MRN: [id number] Date of Birth: October 19, 2000  Transition of Care [facility]) CM/SW Contact  Isaiah Public, LCSWA Phone Number: 03/18/2024, 1:53 PM  Clinical Narrative:     No current SNF bed offers. CSW will continue to follow.  Expected Discharge Plan: Skilled Nursing Facility Barriers to Discharge: Continued Medical Work up, English as a second language teacher, SNF Pending bed offer               Expected Discharge Plan and Services In-house Referral: Clinical Social Work   Post Acute Care Choice: Skilled Nursing Facility Living arrangements for the past 2 months: Single Family Home                                       Social Drivers of Health (SDOH) Interventions SDOH Screenings   Food Insecurity: No Food Insecurity (12/15/2023)  Recent Concern: Food Insecurity - Food Insecurity Present (12/01/2023)  Housing: Low Risk  (12/15/2023)  Transportation Needs: No Transportation Needs (12/15/2023)  Utilities: Not At Risk (12/15/2023)  Depression (PHQ2-9): Low Risk  (06/27/2020)  Recent Concern: Depression (PHQ2-9) - Medium Risk (05/16/2020)  Tobacco Use: High Risk (12/13/2023)    Readmission Risk Interventions    12/14/2023    2:24 PM  Readmission Risk Prevention Plan  Transportation Screening Complete  Medication Review (RN Care Manager) Complete  PCP or Specialist appointment within 3-5 days of discharge Complete  HRI or Home Care Consult Complete  SW Recovery Care/Counseling Consult Complete  Palliative Care Screening Not Applicable  Skilled Nursing Facility Complete

## 2024-03-19 DIAGNOSIS — D649 Anemia, unspecified: Secondary | ICD-10-CM | POA: Diagnosis not present

## 2024-03-19 MED ORDER — FLUOXETINE HCL 20 MG PO CAPS
20.0000 mg | ORAL_CAPSULE | Freq: Every day | ORAL | Status: AC
  Administered 2024-03-20 – 2024-07-30 (×17): 20 mg via ORAL
  Filled 2024-03-19 (×40): qty 1

## 2024-03-19 NOTE — Plan of Care (Signed)
  Problem: Education: Goal: Knowledge of General Education information will improve Description: Including pain rating scale, medication(s)/side effects and non-pharmacologic comfort measures Outcome: Progressing   Problem: Health Behavior/Discharge Planning: Goal: Ability to manage health-related needs will improve Outcome: Progressing   Problem: Clinical Measurements: Goal: Ability to maintain clinical measurements within normal limits will improve Outcome: Progressing Goal: Will remain free from infection Outcome: Progressing Goal: Diagnostic test results will improve Outcome: Progressing Goal: Respiratory complications will improve Outcome: Progressing Goal: Cardiovascular complication will be avoided Outcome: Progressing   Problem: Nutrition: Goal: Adequate nutrition will be maintained Outcome: Progressing   Problem: Coping: Goal: Level of anxiety will decrease Outcome: Progressing   Problem: Elimination: Goal: Will not experience complications related to bowel motility Outcome: Progressing   Problem: Pain Managment: Goal: General experience of comfort will improve and/or be controlled Outcome: Progressing   Problem: Safety: Goal: Ability to remain free from injury will improve Outcome: Progressing

## 2024-03-19 NOTE — TOC Progression Note (Signed)
 Transition of Care [facility]) - Progression Note    Patient Details  Name: Luke Velasquez MRN: [id number] Date of Birth: February 04, 2001  [facility]) CM/SW Contact  Veneta Sliter E Gabryel Files, LCSW Phone Number: 03/19/2024, 9:36 AM  Clinical Narrative:    Possible bed offer at [place]? CSW reached out to [place] with [facility], [place], [facility], Catering manager.) She is out today due to the holiday per her VM. Left a VM requesting follow up tomorrow.   Expected Discharge Plan: Skilled Nursing Facility Barriers to Discharge: Continued Medical Work up, English as a second language teacher, SNF Pending bed offer               Expected Discharge Plan and Services In-house Referral: Clinical Social Work   Post Acute Care Choice: Skilled Nursing Facility Living arrangements for the past 2 months: Single Family Home                                       Social Drivers of Health (SDOH) Interventions SDOH Screenings   Food Insecurity: No Food Insecurity (12/15/2023)  Recent Concern: Food Insecurity - Food Insecurity Present (12/01/2023)  Housing: Low Risk  (12/15/2023)  Transportation Needs: No Transportation Needs (12/15/2023)  Utilities: Not At Risk (12/15/2023)  Depression (PHQ2-9): Low Risk  (06/27/2020)  Recent Concern: Depression (PHQ2-9) - Medium Risk (05/16/2020)  Tobacco Use: High Risk (12/13/2023)    Readmission Risk Interventions    12/14/2023    2:24 PM  Readmission Risk Prevention Plan  Transportation Screening Complete  Medication Review (RN Care Manager) Complete  PCP or Specialist appointment within 3-5 days of discharge Complete  HRI or Home Care Consult Complete  SW Recovery Care/Counseling Consult Complete  Palliative Care Screening Not Applicable  Skilled Nursing Facility Complete
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
## 2024-03-20 DIAGNOSIS — D649 Anemia, unspecified: Secondary | ICD-10-CM | POA: Diagnosis not present

## 2024-03-20 NOTE — Plan of Care (Signed)
  Problem: Education: Goal: Knowledge of General Education information will improve Description: Including pain rating scale, medication(s)/side effects and non-pharmacologic comfort measures Outcome: Not Met (add Reason)   Problem: Health Behavior/Discharge Planning: Goal: Ability to manage health-related needs will improve Outcome: Not Met (add Reason)   Problem: Clinical Measurements: Goal: Ability to maintain clinical measurements within normal limits will improve Outcome: Not Met (add Reason) Goal: Will remain free from infection Outcome: Not Met (add Reason) Goal: Diagnostic test results will improve Outcome: Not Met (add Reason) Goal: Respiratory complications will improve Outcome: Not Met (add Reason) Goal: Cardiovascular complication will be avoided Outcome: Not Met (add Reason)   Problem: Activity: Goal: Risk for activity intolerance will decrease Outcome: Not Met (add Reason)   Problem: Nutrition: Goal: Adequate nutrition will be maintained Outcome: Not Met (add Reason)   Problem: Coping: Goal: Level of anxiety will decrease Outcome: Not Met (add Reason)   Problem: Elimination: Goal: Will not experience complications related to bowel motility Outcome: Not Met (add Reason)   Problem: Pain Managment: Goal: General experience of comfort will improve and/or be controlled Outcome: Not Met (add Reason)   Problem: Safety: Goal: Ability to remain free from injury will improve Outcome: Not Met (add Reason)   Problem: Skin Integrity: Goal: Risk for impaired skin integrity will decrease Outcome: Not Met (add Reason)  Pt is refusing all care and is not interested in learning.

## 2024-03-20 NOTE — Progress Notes (Signed)
 Pt is sitting on the couch by the window trying to get warm he said. He allow his colostomy to be change today but refuse a bath. Pt has been educated on not using the microwave because he is on contact. He said he understood.

## 2024-03-20 NOTE — Progress Notes (Signed)
 Only able to assess the area that are charted in the flowsheet. Will try to assess other area later on in the day. Did have a conversation with pt about changing his colostomy to day because it coming off. Pt did not respond at the time.
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
## 2024-03-20 NOTE — TOC Progression Note (Signed)
 Transition of Care [facility]) - Progression Note    Patient Details  Name: Luke Velasquez MRN: [id number] Date of Birth: 2001-04-05  [facility] CM/SW Contact  Casie Sturgeon [name] Moose, [place] Phone Number: 03/20/2024, 3:40 PM  Clinical Narrative:    CSW received a voicemail from [place], a Child psychotherapist with [place]  [employer] regarding pt. CSW attempted to call back but had to leave a voicemail.    Expected Discharge Plan: Skilled Nursing Facility Barriers to Discharge: Continued Medical Work up, English as a second language teacher, SNF Pending bed offer               Expected Discharge Plan and Services In-house Referral: Clinical Social Work   Post Acute Care Choice: Skilled Nursing Facility Living arrangements for the past 2 months: Single Family Home                                       Social Drivers of Health (SDOH) Interventions SDOH Screenings   Food Insecurity: No Food Insecurity (12/15/2023)  Recent Concern: Food Insecurity - Food Insecurity Present (12/01/2023)  Housing: Low Risk  (12/15/2023)  Transportation Needs: No Transportation Needs (12/15/2023)  Utilities: Not At Risk (12/15/2023)  Depression (PHQ2-9): Low Risk  (06/27/2020)  Recent Concern: Depression (PHQ2-9) - Medium Risk (05/16/2020)  Tobacco Use: High Risk (12/13/2023)    Readmission Risk Interventions    12/14/2023    2:24 PM  Readmission Risk Prevention Plan  Transportation Screening Complete  Medication Review (RN Care Manager) Complete  PCP or Specialist appointment within 3-5 days of discharge Complete  HRI or Home Care Consult Complete  SW Recovery Care/Counseling Consult Complete  Palliative Care Screening Not Applicable  Skilled Nursing Facility Complete

## 2024-03-21 DIAGNOSIS — D649 Anemia, unspecified: Secondary | ICD-10-CM | POA: Diagnosis not present

## 2024-03-21 MED ORDER — ORAL CARE MOUTH RINSE: 15.0000 mL | OROMUCOSAL | Status: AC | PRN

## 2024-03-21 NOTE — Progress Notes (Signed)
 Pt refused to let nurse assess wounds or change dressings. Says it will be done during the day

## 2024-03-21 NOTE — Progress Notes (Signed)
 Replace foley catheter, clean peri area before insertion. Clotilda RN assist with the foley placement and wound care. All wounds clean and dress, pictures uploaded to EPIC.

## 2024-03-21 NOTE — Plan of Care (Signed)
   Problem: Education: Goal: Knowledge of General Education information will improve Description: Including pain rating scale, medication(s)/side effects and non-pharmacologic comfort measures Outcome: Not Progressing   Problem: Activity: Goal: Risk for activity intolerance will decrease Outcome: Progressing   Problem: Nutrition: Goal: Adequate nutrition will be maintained Outcome: Progressing

## 2024-03-21 NOTE — Plan of Care (Signed)
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
## 2024-03-21 NOTE — Progress Notes (Signed)
 [facility] [phone] [facility] Liaison Note   Previous [facility] patient who revoked his Hospice Benefit.    Appears bed offer may be available at [facility] in [place].    If going for LTC we can resume hospice services, if going for SNF we can follow for outpatient palliative if desired.   Hospital Liaison will continue to follow for disposition.   Please call with questions or concerns.   Thank you, Eleanor Nail, [facility] Liaison [phone]

## 2024-03-22 DIAGNOSIS — D649 Anemia, unspecified: Secondary | ICD-10-CM | POA: Diagnosis not present

## 2024-03-22 NOTE — Progress Notes (Signed)
 [facility] PROGRESS NOTE  Patient: Luke Velasquez FMW:[id number]   PCP: [facility] DOB: 2000/07/23   DOA: 12/13/2023   DOS: 03/22/2024    Subjective: Patient last Foley catheter change as well as wound care yesterday.  No nausea no vomiting no other acute complaint.  Objective:  Vitals:   03/20/24 0600 03/21/24 0520 03/21/24 2117 03/22/24 0440  BP: 111/73 99/61 124/71 98/62  Pulse: 63 80 75 71  Resp: 18 16 17 16   Temp: 98.1 F (36.7 C) 98.5 F (36.9 C) 98.7 F (37.1 C) 97.9 F (36.6 C)  TempSrc: Oral Oral Oral Oral  SpO2: 100% 100% 99% 100%  Weight:      Height:       Assessment and plan: Currently comfort care. Monitor for now. Wound appears to be improving although does need more frequent debridement.  Will monitor if patient allows further regular dressing changes.  Author: Yetta Blanch, MD [name] 03/22/2024 6:24 PM   If 7PM-7AM, please contact night-coverage at [email or web address]

## 2024-03-22 NOTE — Progress Notes (Signed)
 Patient refused a full assessment especially with his wounds.

## 2024-03-22 NOTE — Plan of Care (Signed)

## 2024-03-22 NOTE — Plan of Care (Signed)
  Problem: Safety: Goal: Ability to remain free from injury will improve Outcome: Progressing   Problem: Pain Managment: Goal: General experience of comfort will improve and/or be controlled Outcome: Progressing   Problem: Elimination: Goal: Will not experience complications related to bowel motility Outcome: Progressing   Problem: Clinical Measurements: Goal: Cardiovascular complication will be avoided Outcome: Progressing   Problem: Clinical Measurements: Goal: Respiratory complications will improve Outcome: Progressing

## 2024-03-22 NOTE — Progress Notes (Signed)
 Patient refused his dressing change,educated x 3,will continue to monitor

## 2024-03-23 DIAGNOSIS — D649 Anemia, unspecified: Secondary | ICD-10-CM | POA: Diagnosis not present

## 2024-03-23 NOTE — Plan of Care (Signed)
  Problem: Coping: Goal: Ability to identify and develop effective coping behavior will improve Outcome: Progressing   Problem: Pain Management: Goal: Satisfaction with pain management regimen will improve Outcome: Progressing

## 2024-03-23 NOTE — Plan of Care (Signed)
  Problem: Clinical Measurements: Goal: Ability to maintain clinical measurements within normal limits will improve Outcome: Completed/Met   Problem: Health Behavior/Discharge Planning: Goal: Ability to manage health-related needs will improve Outcome: Completed/Met   Problem: Nutrition: Goal: Adequate nutrition will be maintained Outcome: Completed/Met   Problem: Coping: Goal: Level of anxiety will decrease Outcome: Completed/Met

## 2024-03-23 NOTE — Progress Notes (Signed)
 [facility] PROGRESS NOTE  Patient: Rayburn Mundis FMW:[id number]   PCP: [facility] DOB: Mar 06, 2001   DOA: 12/13/2023   DOS: 03/23/2024    Subjective: Denies any acute complaint.  No nausea or vomiting.  Objective:  Vitals:   03/20/24 0600 03/21/24 0520 03/21/24 2117 03/22/24 0440  BP: 111/73 99/61 124/71 98/62  Pulse: 63 80 75 71  Resp: 18 16 17 16   Temp: 98.1 F (36.7 C) 98.5 F (36.9 C) 98.7 F (37.1 C) 97.9 F (36.6 C)  TempSrc:  Oral Oral Oral  SpO2: 100% 100% 99% 100%  Weight:      Height:       Clear to auscultation. Alert awake and oriented. Assessment and plan: Refused wound care again. Refused blood work when offered. No clear explanation provided. Continue comfort care.  Author: Yetta Blanch, MD [name] 03/23/2024 6:30 PM   If 7PM-7AM, please contact night-coverage at [email or web address]

## 2024-03-24 DIAGNOSIS — D649 Anemia, unspecified: Secondary | ICD-10-CM | POA: Diagnosis not present

## 2024-03-24 NOTE — TOC Progression Note (Addendum)
 Transition of Care [facility]) - Progression Note    Patient Details  Name: Luke Velasquez MRN: [id number] Date of Birth: 2001/06/17  [facility] CM/SW Contact  Isaiah Public, LCSWA Phone Number: 03/24/2024, 3:58 PM  Clinical Narrative:     CSW spoke with Brittney with [facility] who informed CSW to have Amber CSW to follow back up with her on Monday to see if [facility] [place] [facility] can offer SNF bed for patient. TOC will continue to follow.  Expected Discharge Plan: Skilled Nursing Facility Barriers to Discharge: Continued Medical Work up, English as a second language teacher, SNF Pending bed offer               Expected Discharge Plan and Services In-house Referral: Clinical Social Work   Post Acute Care Choice: Skilled Nursing Facility Living arrangements for the past 2 months: Single Family Home                                       Social Drivers of Health (SDOH) Interventions SDOH Screenings   Food Insecurity: No Food Insecurity (12/15/2023)  Recent Concern: Food Insecurity - Food Insecurity Present (12/01/2023)  Housing: Low Risk  (12/15/2023)  Transportation Needs: No Transportation Needs (12/15/2023)  Utilities: Not At Risk (12/15/2023)  Depression (PHQ2-9): Low Risk  (06/27/2020)  Recent Concern: Depression (PHQ2-9) - Medium Risk (05/16/2020)  Tobacco Use: High Risk (12/13/2023)    Readmission Risk Interventions    12/14/2023    2:24 PM  Readmission Risk Prevention Plan  Transportation Screening Complete  Medication Review (RN Care Manager) Complete  PCP or Specialist appointment within 3-5 days of discharge Complete  HRI or Home Care Consult Complete  SW Recovery Care/Counseling Consult Complete  Palliative Care Screening Not Applicable  Skilled Nursing Facility Complete

## 2024-03-24 NOTE — Progress Notes (Incomplete)
 MEWS Progress Note  Patient Details Name: Luke Velasquez MRN: [id number] DOB: 11-08-2000 Today's Date: 03/24/2024   MEWS Flowsheet Documentation:  Assess: MEWS Score Temp: 97.6 F (36.4 C) BP: 105/64 MAP (mmHg): 76 Pulse Rate: 77 ECG Heart Rate: 80 Resp: 18 Level of Consciousness: Alert SpO2: 100 % O2 Device: Room Air Patient Activity (if Appropriate): In chair Assess: MEWS Score MEWS Temp: 0 MEWS Systolic: 0 MEWS Pulse: 0 MEWS RR: 0 MEWS LOC: 0 MEWS Score: 0 MEWS Score Color: Comfort Care Only Assess: SIRS CRITERIA SIRS Temperature : 0 SIRS Respirations : 0 SIRS Pulse: 0 SIRS WBC: 0 SIRS Score Sum : 0          [name] 03/24/2024, 12:58 PM

## 2024-03-24 NOTE — Progress Notes (Signed)
 [facility] PROGRESS NOTE  Patient: Luke Velasquez FMW:[id number]   PCP: [facility] DOB: July 28, 2000   DOA: 12/13/2023   DOS: 03/24/2024    Subjective: No acute complaint.  No nausea or vomiting.  Educated about Foley catheter bag placement which was on the bed.   Objective:  Vitals:   03/21/24 0520 03/21/24 2117 03/22/24 0440 03/24/24 0441  BP: 99/61 124/71 98/62 105/64  Pulse: 80 75 71 77  Resp: 16 17 16 18   Temp: 98.5 F (36.9 C) 98.7 F (37.1 C) 97.9 F (36.6 C) 97.6 F (36.4 C)  TempSrc: Oral Oral Oral Oral  SpO2: 100% 99% 100% 100%  Weight:      Height:       Assessment and plan: Intermittently refusing care again in the hospital. Denies any acute complaint.  Continue comfort care.  Author: Yetta Blanch, MD [name] 03/24/2024 5:57 PM   If 7PM-7AM, please contact night-coverage at [email or web address]

## 2024-03-24 NOTE — Progress Notes (Signed)
 RN noted foley bag in bed with pt and provided education on why it should be below bladder and how it can promote UTI from stagnant urine. Pt kept bag in bed and was re-educated, which prompted pt to place foley bag on the floor.  Pt agreed to bathing but changed his mind because he does not want to. RN spent ample amount of time explaining proper position for foley bag, Juven education and how it helps promote wound healing by increasing protein in the body. RN explained his well being is our top priority and to please consider becoming more involved and agreeable to his plan of care. Pt agreeable to thinking about it.   Call bell left in reach. Bed in lowest position

## 2024-03-24 NOTE — Plan of Care (Signed)
  Problem: Clinical Measurements: Goal: Quality of life will improve Outcome: Not Progressing   Problem: Coping: Goal: Ability to identify and develop effective coping behavior will improve Outcome: Not Progressing   Problem: Role Relationship: Goal: Family's ability to cope with current situation will improve Outcome: Not Progressing Goal: Ability to verbalize concerns, feelings, and thoughts to partner or family member will improve Outcome: Not Progressing

## 2024-03-24 NOTE — Plan of Care (Signed)

## 2024-03-25 NOTE — Plan of Care (Signed)
  Problem: Coping: Goal: Ability to identify and develop effective coping behavior will improve Outcome: Progressing   Problem: Pain Management: Goal: Satisfaction with pain management regimen will improve Outcome: Progressing

## 2024-03-25 NOTE — Progress Notes (Signed)
 [facility] PROGRESS NOTE  Patient: Luke Velasquez FMW:[id number]   PCP: [facility] DOB: [birth year]   DOA: 12/13/2023   DOS: 03/25/2024    Subjective: No acute complaints.  Objective:  Vitals:   03/21/24 0520 03/21/24 2117 03/22/24 0440 03/24/24 0441  BP: 99/61 124/71 98/62 105/64  Pulse: 80 75 71 77  Resp: 16 17 16 18   Temp: 98.5 F (36.9 C) 98.7 F (37.1 C) 97.9 F (36.6 C) 97.6 F (36.4 C)  TempSrc: Oral Oral Oral Oral  SpO2: 100% 99% 100% 100%  Weight:      Height:       Did not allow exam as was eating lunch. Assessment and plan: Denying wound care regular denying Foley care.  No particular reason provided again.  Author: Yetta Blanch, MD [name] 03/25/2024 6:32 PM   If 7PM-7AM, please contact night-coverage at [email or web address]

## 2024-03-25 NOTE — Progress Notes (Addendum)
 Patient refused foley care and wound care. Patient also refused vitals this morning per nurse tech.

## 2024-03-25 NOTE — Plan of Care (Signed)
   Problem: Education: Goal: Knowledge of the prescribed therapeutic regimen will improve Outcome: Progressing   Problem: Coping: Goal: Ability to identify and develop effective coping behavior will improve Outcome: Progressing

## 2024-03-26 DIAGNOSIS — D649 Anemia, unspecified: Secondary | ICD-10-CM | POA: Diagnosis not present

## 2024-03-26 MED ORDER — OXYMETAZOLINE HCL 0.05 % NA SOLN
1.0000 | Freq: Two times a day (BID) | NASAL | Status: AC | PRN
  Filled 2024-03-26: qty 30

## 2024-03-26 NOTE — Plan of Care (Signed)
  Problem: Education: Goal: Knowledge of the prescribed therapeutic regimen will improve 03/26/2024 2044 by Rozelle Caudle K, RN Outcome: Progressing 03/26/2024 2043 by Leonardo Plaia K, RN Outcome: Progressing   Problem: Coping: Goal: Ability to identify and develop effective coping behavior will improve 03/26/2024 2044 by Myiesha Edgar K, RN Outcome: Progressing 03/26/2024 2043 by Makyle Eslick K, RN Outcome: Progressing   Problem: Clinical Measurements: Goal: Quality of life will improve 03/26/2024 2044 by Mattias Walmsley K, RN Outcome: Progressing 03/26/2024 2043 by Denya Buckingham K, RN Outcome: Progressing   Problem: Respiratory: Goal: Verbalizations of increased ease of respirations will increase 03/26/2024 2044 by Ronaldo Crilly K, RN Outcome: Progressing 03/26/2024 2043 by Ronnetta Currington K, RN Outcome: Progressing   Problem: Role Relationship: Goal: Ability to verbalize concerns, feelings, and thoughts to partner or family member will improve 03/26/2024 2044 by Kadence Mikkelson K, RN Outcome: Progressing 03/26/2024 2043 by Francess Mullen K, RN Outcome: Progressing   Problem: Pain Management: Goal: Satisfaction with pain management regimen will improve 03/26/2024 2044 by Honest Vanleer K, RN Outcome: Progressing 03/26/2024 2043 by Cortez Flippen K, RN Outcome: Progressing

## 2024-03-26 NOTE — Plan of Care (Signed)
   Problem: Education: Goal: Knowledge of the prescribed therapeutic regimen will improve Outcome: Progressing   Problem: Coping: Goal: Ability to identify and develop effective coping behavior will improve Outcome: Progressing

## 2024-03-26 NOTE — Progress Notes (Signed)
 [facility] PROGRESS NOTE  Patient: Luke Velasquez FMW:[id number]   PCP: [facility] DOB: 2000/08/28   DOA: 12/13/2023   DOS: 03/26/2024    Subjective: Refused catheter care as well as wound care again.  Had some bleeding from his nose.  Which currently resolved the time of my evaluation.  Had some bleeding from his wounds as well.  Denies any other acute complaint.  Objective:  Vitals:   03/21/24 2117 03/22/24 0440 03/24/24 0441 03/26/24 0400  BP: 124/71 98/62 105/64 (!) 107/52  Pulse: 75 71 77 84  Resp: 17 16 18 19   Temp:  97.9 F (36.6 C) 97.6 F (36.4 C) 98 F (36.7 C)  TempSrc:  Oral Oral Oral  SpO2: 99% 100% 100% 100%  Weight:      Height:       Clear to auscultation. S1-S2 present. No edema.  Assessment and plan: Epistaxis. Monitor for Afrin as needed.  Bleeding from the wound. No evidence of active infection. Recommended patient to continue to participate in wound care.  Chronic indwelling Foley. I recommended patient to continue participating in Foley care.  Continue comfort care as planned.  Author: Yetta Blanch, MD [name] 03/26/2024 6:38 PM   If 7PM-7AM, please contact night-coverage at [email or web address]

## 2024-03-26 NOTE — Plan of Care (Deleted)
  Problem: Education: Goal: Knowledge of the prescribed therapeutic regimen will improve Outcome: Progressing   Problem: Coping: Goal: Ability to identify and develop effective coping behavior will improve Outcome: Progressing   Problem: Clinical Measurements: Goal: Quality of life will improve Outcome: Progressing   Problem: Respiratory: Goal: Verbalizations of increased ease of respirations will increase Outcome: Progressing   Problem: Role Relationship: Goal: Ability to verbalize concerns, feelings, and thoughts to partner or family member will improve Outcome: Progressing   Problem: Pain Management: Goal: Satisfaction with pain management regimen will improve Outcome: Progressing

## 2024-03-27 NOTE — Plan of Care (Signed)
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
## 2024-03-28 DIAGNOSIS — D649 Anemia, unspecified: Secondary | ICD-10-CM | POA: Diagnosis not present

## 2024-03-28 NOTE — Plan of Care (Signed)
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
## 2024-03-29 DIAGNOSIS — D649 Anemia, unspecified: Secondary | ICD-10-CM | POA: Diagnosis not present
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
## 2024-03-29 NOTE — Plan of Care (Signed)
   Problem: Education: Goal: Knowledge of the prescribed therapeutic regimen will improve Outcome: Progressing   Problem: Coping: Goal: Ability to identify and develop effective coping behavior will improve Outcome: Progressing

## 2024-03-29 NOTE — Progress Notes (Signed)
 Patient refused medication. Explained to the patient about the need to take the medication but still refused. [facility]ger Nurse informed.

## 2024-03-29 NOTE — Plan of Care (Signed)
 Refuses care.  Problem: Education: Goal: Knowledge of the prescribed therapeutic regimen will improve 03/29/2024 1858 by Carvin Grad, RN Outcome: Not Progressing 03/29/2024 1857 by Carvin Grad, RN Outcome: Adequate for Discharge   Problem: Coping: Goal: Ability to identify and develop effective coping behavior will improve 03/29/2024 1858 by Carvin Grad, RN Outcome: Not Progressing 03/29/2024 1857 by Carvin Grad, RN Outcome: Adequate for Discharge

## 2024-03-29 NOTE — Progress Notes (Signed)
 Patient refused dressing changes and foley care.

## 2024-03-30 DIAGNOSIS — D649 Anemia, unspecified: Secondary | ICD-10-CM | POA: Diagnosis not present

## 2024-03-30 NOTE — Plan of Care (Signed)
   Problem: Education: Goal: Knowledge of the prescribed therapeutic regimen will improve Outcome: Progressing

## 2024-03-30 NOTE — Plan of Care (Signed)
 Patient refusing care and dressing changes.    Problem: Education: Goal: Knowledge of the prescribed therapeutic regimen will improve 03/30/2024 1941 by Carvin Grad, RN Outcome: Not Progressing
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
## 2024-03-31 DIAGNOSIS — D649 Anemia, unspecified: Secondary | ICD-10-CM | POA: Diagnosis not present
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
## 2024-03-31 NOTE — Plan of Care (Signed)

## 2024-04-01 DIAGNOSIS — D649 Anemia, unspecified: Secondary | ICD-10-CM | POA: Diagnosis not present

## 2024-04-01 NOTE — Progress Notes (Signed)
 Pt refused morning meds, morning assessment. States he does not want his dressing changed. Noticed that ostomy bag was full of air and pt would also not allow me to burp bag and states that he would do it.

## 2024-04-01 NOTE — Plan of Care (Signed)
 Pt refused all scheduled meds last night.  Pt declined a full assessment last night also. 2 piece ostomy bag and wafer were changed late last night. No complaints of pain.   Problem: Education: Goal: Knowledge of the prescribed therapeutic regimen will improve 04/01/2024 0612 by Jack Domnick LABOR, RN Outcome: Progressing 04/01/2024 0612 by Jack Domnick LABOR, RN Outcome: Progressing   Problem: Coping: Goal: Ability to identify and develop effective coping behavior will improve 04/01/2024 0612 by Jack Domnick LABOR, RN Outcome: Progressing 04/01/2024 0612 by Jack Domnick LABOR, RN Outcome: Progressing   Problem: Clinical Measurements: Goal: Quality of life will improve 04/01/2024 0612 by Jack Domnick LABOR, RN Outcome: Progressing 04/01/2024 0612 by Jack Domnick LABOR, RN Outcome: Progressing   Problem: Respiratory: Goal: Verbalizations of increased ease of respirations will increase 04/01/2024 0612 by Jack Domnick LABOR, RN Outcome: Progressing 04/01/2024 0612 by Jack Domnick LABOR, RN Outcome: Progressing   Problem: Role Relationship: Goal: Family's ability to cope with current situation will improve 04/01/2024 0612 by Jack Domnick LABOR, RN Outcome: Progressing 04/01/2024 0612 by Jack Domnick LABOR, RN Outcome: Progressing Goal: Ability to verbalize concerns, feelings, and thoughts to partner or family member will improve 04/01/2024 0612 by Jack Domnick LABOR, RN Outcome: Progressing 04/01/2024 0612 by Jack Domnick LABOR, RN Outcome: Progressing   Problem: Pain Management: Goal: Satisfaction with pain management regimen will improve 04/01/2024 0612 by Jack Domnick LABOR, RN Outcome: Progressing 04/01/2024 0612 by Jack Domnick LABOR, RN Outcome: Progressing

## 2024-04-01 NOTE — Progress Notes (Signed)
 Pt refusing care and dressing changes. Does not want to be touch to be repositioned
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
## 2024-04-02 DIAGNOSIS — D649 Anemia, unspecified: Secondary | ICD-10-CM | POA: Diagnosis not present

## 2024-04-02 NOTE — Plan of Care (Signed)
 Pt refused all scheduled meds last night.  Pt was agreeable for this RN to perform a partial assessment. Pt denies pain. Problem: Education: Goal: Knowledge of the prescribed therapeutic regimen will improve Outcome: Progressing   Problem: Coping: Goal: Ability to identify and develop effective coping behavior will improve Outcome: Progressing   Problem: Clinical Measurements: Goal: Quality of life will improve Outcome: Progressing   Problem: Respiratory: Goal: Verbalizations of increased ease of respirations will increase Outcome: Progressing   Problem: Role Relationship: Goal: Family's ability to cope with current situation will improve Outcome: Progressing Goal: Ability to verbalize concerns, feelings, and thoughts to partner or family member will improve Outcome: Progressing   Problem: Pain Management: Goal: Satisfaction with pain management regimen will improve Outcome: Progressing
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
## 2024-04-03 DIAGNOSIS — D649 Anemia, unspecified: Secondary | ICD-10-CM | POA: Diagnosis not present

## 2024-04-03 NOTE — Plan of Care (Signed)
  Problem: Education: Goal: Knowledge of the prescribed therapeutic regimen will improve Outcome: Progressing   Problem: Coping: Goal: Ability to identify and develop effective coping behavior will improve Outcome: Progressing   Problem: Clinical Measurements: Goal: Quality of life will improve Outcome: Progressing

## 2024-04-03 NOTE — Plan of Care (Signed)
  Problem: Education: Goal: Knowledge of the prescribed therapeutic regimen will improve Outcome: Progressing   Problem: Respiratory: Goal: Verbalizations of increased ease of respirations will increase Outcome: Progressing   Problem: Pain Management: Goal: Satisfaction with pain management regimen will improve Outcome: Progressing   Problem: Coping: Goal: Ability to identify and develop effective coping behavior will improve Outcome: Not Progressing   Problem: Clinical Measurements: Goal: Quality of life will improve Outcome: Not Progressing   Problem: Role Relationship: Goal: Family's ability to cope with current situation will improve Outcome: Not Progressing Goal: Ability to verbalize concerns, feelings, and thoughts to partner or family member will improve Outcome: Not Progressing
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
## 2024-04-04 DIAGNOSIS — D649 Anemia, unspecified: Secondary | ICD-10-CM | POA: Diagnosis not present

## 2024-04-04 NOTE — Plan of Care (Signed)
  Problem: Role Relationship: Goal: Family's ability to cope with current situation will improve Outcome: Progressing Goal: Ability to verbalize concerns, feelings, and thoughts to partner or family member will improve Outcome: Progressing   Problem: Pain Management: Goal: Satisfaction with pain management regimen will improve Outcome: Progressing   Problem: Education: Goal: Knowledge of the prescribed therapeutic regimen will improve Outcome: Not Progressing   Problem: Coping: Goal: Ability to identify and develop effective coping behavior will improve Outcome: Not Progressing   Problem: Clinical Measurements: Goal: Quality of life will improve Outcome: Not Progressing   Problem: Respiratory: Goal: Verbalizations of increased ease of respirations will increase Outcome: Not Progressing
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
## 2024-04-04 NOTE — Progress Notes (Signed)
 Patient refused to have wounds measured today.

## 2024-04-05 DIAGNOSIS — D649 Anemia, unspecified: Secondary | ICD-10-CM | POA: Diagnosis not present

## 2024-04-05 NOTE — Plan of Care (Signed)
  Problem: Respiratory: Goal: Verbalizations of increased ease of respirations will increase Outcome: Progressing   Problem: Pain Management: Goal: Satisfaction with pain management regimen will improve Outcome: Progressing   Problem: Education: Goal: Knowledge of the prescribed therapeutic regimen will improve Outcome: Not Progressing   Problem: Coping: Goal: Ability to identify and develop effective coping behavior will improve Outcome: Not Progressing   Problem: Clinical Measurements: Goal: Quality of life will improve Outcome: Not Progressing   Problem: Role Relationship: Goal: Family's ability to cope with current situation will improve Outcome: Not Progressing Goal: Ability to verbalize concerns, feelings, and thoughts to partner or family member will improve Outcome: Not Progressing
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
## 2024-04-05 NOTE — Evaluation (Signed)
 Patient refused vitals, initial assessment and HS meds.

## 2024-04-06 DIAGNOSIS — D649 Anemia, unspecified: Secondary | ICD-10-CM | POA: Diagnosis not present

## 2024-04-06 NOTE — Evaluation (Signed)
 Foley bag assessed to be on the bed at the level of the patient's bladder. Educated on the importance of keeping bag below level. Verbalizes understanding.

## 2024-04-06 NOTE — TOC Progression Note (Signed)
 Transition of Care [facility]) - Progression Note    Patient Details  Name: Dominiq Fontaine MRN: [id number] Date of Birth: [birth year]  [facility] CM/SW Contact  Finas Delone [name] Moose, [place] Phone Number: 04/06/2024, 11:37 AM  Clinical Narrative:   CSW continuing to follow, still no bed offers and have not heard back from pt mom.    Expected Discharge Plan: Skilled Nursing Facility Barriers to Discharge: Continued Medical Work up, English as a second language teacher, SNF Pending bed offer               Expected Discharge Plan and Services In-house Referral: Clinical Social Work   Post Acute Care Choice: Skilled Nursing Facility Living arrangements for the past 2 months: Single Family Home                                       Social Drivers of Health (SDOH) Interventions SDOH Screenings   Food Insecurity: No Food Insecurity (12/15/2023)  Recent Concern: Food Insecurity - Food Insecurity Present (12/01/2023)  Housing: Low Risk  (12/15/2023)  Transportation Needs: No Transportation Needs (12/15/2023)  Utilities: Not At Risk (12/15/2023)  Depression (PHQ2-9): Low Risk  (06/27/2020)  Recent Concern: Depression (PHQ2-9) - Medium Risk (05/16/2020)  Tobacco Use: High Risk (12/13/2023)    Readmission Risk Interventions    12/14/2023    2:24 PM  Readmission Risk Prevention Plan  Transportation Screening Complete  Medication Review (RN Care Manager) Complete  PCP or Specialist appointment within 3-5 days of discharge Complete  HRI or Home Care Consult Complete  SW Recovery Care/Counseling Consult Complete  Palliative Care Screening Not Applicable  Skilled Nursing Facility Complete
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
## 2024-04-06 NOTE — NC FL2 (Signed)
 [place]  MEDICAID FL2 LEVEL OF CARE FORM     IDENTIFICATION  Patient Name: Luke Velasquez Birthdate: 2000/08/01 Sex: male Admission Date (Current Location): 12/13/2023  [facility] and [place] Number:  Producer, television/film/video and Address:  [facility] [place], [address] [place], [place] [address]      Provider Number: [id number]  Attending Physician Name and Address:  Luke Reichert, MD  Relative Name and Phone Number:       Current Level of Care: Hospital Recommended Level of Care: Skilled Nursing Facility Prior Approval Number:    Date Approved/Denied:   PASRR Number: [id number] Velasquez  Discharge Plan: SNF    Current Diagnoses: Patient Active Problem List   Diagnosis Date Noted   Sepsis due to cellulitis (HCC) 11/21/2023   Symptomatic anemia 11/21/2023   Lactic acidosis 11/21/2023   Bipolar disorder (HCC) 11/21/2023   Impaired mobility 11/21/2023   Insomnia due to medical condition 06/25/2023   Sepsis secondary to UTI (HCC) 06/24/2023   Sacral decubitus ulcer 06/24/2023   Chronic osteomyelitis of pelvic region (HCC) 06/24/2023   Acute blood loss anemia 06/24/2023   History of DVT (deep vein thrombosis) 06/24/2023   Thrombocytosis 06/24/2023   Involuntary commitment 06/24/2023   Pressure injury of skin 02/14/2023   Protein-calorie malnutrition, severe (HCC) 02/14/2023   UTI (urinary tract infection) 02/14/2023   Drainage from wound 02/12/2023   PTSD (post-traumatic stress disorder) 12/07/2022   Psychosis (HCC) 08/06/2022   Polysubstance abuse (HCC) 08/06/2022   Impaired mobility and ADLs 03/10/2022   Colostomy in place [facility]) 05/16/2020   Paraplegia (HCC) 05/16/2020   Spasticity 05/16/2020   Neurogenic bladder 05/16/2020   Wheelchair dependence 05/16/2020    Orientation RESPIRATION BLADDER Height & Weight     Self, Time, Situation, Place  Normal Continent, Indwelling catheter Weight: 123 lb 7.3 oz (56 kg) Height:  6' 4 (193 cm)  BEHAVIORAL  SYMPTOMS/MOOD NEUROLOGICAL BOWEL NUTRITION STATUS      Continent, Colostomy Diet (See dc summary)  AMBULATORY STATUS COMMUNICATION OF NEEDS Skin   Extensive Assist Verbally PU Stage and Appropriate Care (Stage II on hip,leg, abdomen, ankle,heel ; Stage III on buttocks and sacrum; Stage IV on hip and rectum; non pressure wound on pretibial)                       Personal Care Assistance Level of Assistance  Bathing, Feeding, Dressing Bathing Assistance: Maximum assistance Feeding assistance: Independent Dressing Assistance: Limited assistance     Functional Limitations Info             SPECIAL CARE FACTORS FREQUENCY  PT (By licensed PT), OT (By licensed OT)     PT Frequency: eval and treat OT Frequency: eval and treat            Contractures Contractures Info: Not present    Additional Factors Info  Code Status, Allergies, Isolation Precautions Code Status Info: Full Allergies Info: Ivp Dye (Iodinated Contrast Media)     Isolation Precautions Info: OMDRO     Current Medications (04/06/2024):  This is the current hospital active medication list Current Facility-Administered Medications  Medication Dose Route Frequency Provider Last Rate Last Admin   acetaminophen  (TYLENOL ) tablet 650 mg  650 mg Oral Q4H PRN Luke Reyes DASEN, MD       FLUoxetine  (PROZAC ) capsule 20 mg  20 mg Oral QHS Luke Reyes DASEN, MD   20 mg at 03/28/24 2152   LORazepam  (ATIVAN ) tablet 0.5 mg  0.5 mg Oral QHS Luke Reyes DASEN, MD   0.5 mg at 03/28/24 2152   mirtazapine  (REMERON ) tablet 15 mg  15 mg Oral QHS Luke Channel, MD   15 mg at 03/28/24 2152   nutrition supplement (JUVEN) (JUVEN) powder packet 1 packet  1 packet Oral BID BM Luke Donalda HERO, MD   1 packet at 04/06/24 1033   Oral care mouth rinse  15 mL Mouth Rinse PRN Luke Reyes DASEN, MD       traZODone  (DESYREL ) tablet 100 mg  100 mg Oral QHS McQuilla, Jai B, MD   100 mg at 03/28/24 2152   zinc  sulfate (50mg  elemental  zinc ) capsule 220 mg  220 mg Oral Daily Regalado, Belkys A, MD   220 mg at 03/30/24 1241     Discharge Medications: Please see discharge summary for Velasquez list of discharge medications.  Relevant Imaging Results:  Relevant Lab Results:   Additional Information SSN: [id number] Luke Velasquez, [place]

## 2024-04-07 DIAGNOSIS — D649 Anemia, unspecified: Secondary | ICD-10-CM | POA: Diagnosis not present

## 2024-04-07 NOTE — Plan of Care (Signed)
  Problem: Education: Goal: Knowledge of the prescribed therapeutic regimen will improve Outcome: Progressing   Problem: Coping: Goal: Ability to identify and develop effective coping behavior will improve Outcome: Progressing   Problem: Clinical Measurements: Goal: Quality of life will improve Outcome: Progressing   Problem: Role Relationship: Goal: Family's ability to cope with current situation will improve Outcome: Progressing Goal: Ability to verbalize concerns, feelings, and thoughts to partner or family member will improve Outcome: Progressing
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
## 2024-04-07 NOTE — Plan of Care (Signed)
   Problem: Education: Goal: Knowledge of the prescribed therapeutic regimen will improve Outcome: Progressing   Problem: Coping: Goal: Ability to identify and develop effective coping behavior will improve Outcome: Progressing

## 2024-04-08 DIAGNOSIS — D649 Anemia, unspecified: Secondary | ICD-10-CM | POA: Diagnosis not present

## 2024-04-08 NOTE — Plan of Care (Signed)
   Problem: Education: Goal: Knowledge of the prescribed therapeutic regimen will improve Outcome: Progressing

## 2024-04-08 NOTE — Progress Notes (Signed)
 Refused meds and wound care.
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
## 2024-04-09 DIAGNOSIS — D649 Anemia, unspecified: Secondary | ICD-10-CM | POA: Diagnosis not present
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
## 2024-04-09 NOTE — Plan of Care (Signed)
  Problem: Education: Goal: Knowledge of the prescribed therapeutic regimen will improve 04/09/2024 1903 by Keiron Iodice K, RN Outcome: Progressing 04/09/2024 1903 by Bonny Egger K, RN Outcome: Progressing   Problem: Coping: Goal: Ability to identify and develop effective coping behavior will improve 04/09/2024 1903 by Iker Nuttall K, RN Outcome: Progressing 04/09/2024 1903 by Aarish Rockers K, RN Outcome: Progressing   Problem: Respiratory: Goal: Verbalizations of increased ease of respirations will increase 04/09/2024 1903 by Emanii Bugbee K, RN Outcome: Progressing 04/09/2024 1903 by Florian Dellis POUR, RN Outcome: Progressing   Problem: Role Relationship: Goal: Family's ability to cope with current situation will improve 04/09/2024 1903 by Zayven Powe K, RN Outcome: Progressing 04/09/2024 1903 by Akeyla Molden K, RN Outcome: Progressing   Problem: Pain Management: Goal: Satisfaction with pain management regimen will improve 04/09/2024 1903 by Sid Greener K, RN Outcome: Progressing 04/09/2024 1903 by Trameka Dorough K, RN Outcome: Progressing   Problem: Clinical Measurements: Goal: Quality of life will improve 04/09/2024 1903 by Endrit Gittins K, RN Outcome: Not Progressing 04/09/2024 1903 by Rockney Grenz K, RN Outcome: Not Progressing   Problem: Role Relationship: Goal: Ability to verbalize concerns, feelings, and thoughts to partner or family member will improve 04/09/2024 1903 by Danyel Griess K, RN Outcome: Not Progressing 04/09/2024 1903 by Earnstine Meinders K, RN Outcome: Not Progressing

## 2024-04-09 NOTE — Plan of Care (Deleted)
  Problem: Education: Goal: Knowledge of the prescribed therapeutic regimen will improve Outcome: Progressing   Problem: Coping: Goal: Ability to identify and develop effective coping behavior will improve Outcome: Progressing   Problem: Respiratory: Goal: Verbalizations of increased ease of respirations will increase Outcome: Progressing   Problem: Role Relationship: Goal: Family's ability to cope with current situation will improve Outcome: Progressing   Problem: Pain Management: Goal: Satisfaction with pain management regimen will improve Outcome: Progressing   Problem: Clinical Measurements: Goal: Quality of life will improve Outcome: Not Progressing   Problem: Role Relationship: Goal: Ability to verbalize concerns, feelings, and thoughts to partner or family member will improve Outcome: Not Progressing

## 2024-04-09 NOTE — TOC Progression Note (Signed)
 Transition of Care [facility]) - Progression Note    Patient Details  Name: Luke Velasquez MRN: [id number] Date of Birth: June 05, 2001  [facility] CM/SW Contact  Luise JAYSON Pan, [place] Phone Number: 04/09/2024, 1:08 PM  Clinical Narrative:     CSW continuing to follow, still no bed offers.   Barriers to placement: no bed offers, insurance out of network w/ some facilities, pt refusing wound care, no ltc payor source.   CSW will continue to follow.    Expected Discharge Plan: Skilled Nursing Facility Barriers to Discharge: Continued Medical Work up, English as a second language teacher, SNF Pending bed offer               Expected Discharge Plan and Services In-house Referral: Clinical Social Work   Post Acute Care Choice: Skilled Nursing Facility Living arrangements for the past 2 months: Single Family Home                                       Social Drivers of Health (SDOH) Interventions SDOH Screenings   Food Insecurity: No Food Insecurity (12/15/2023)  Recent Concern: Food Insecurity - Food Insecurity Present (12/01/2023)  Housing: Low Risk  (12/15/2023)  Transportation Needs: No Transportation Needs (12/15/2023)  Utilities: Not At Risk (12/15/2023)  Depression (PHQ2-9): Low Risk  (06/27/2020)  Recent Concern: Depression (PHQ2-9) - Medium Risk (05/16/2020)  Tobacco Use: High Risk (12/13/2023)    Readmission Risk Interventions    12/14/2023    2:24 PM  Readmission Risk Prevention Plan  Transportation Screening Complete  Medication Review (RN Care Manager) Complete  PCP or Specialist appointment within 3-5 days of discharge Complete  HRI or Home Care Consult Complete  SW Recovery Care/Counseling Consult Complete  Palliative Care Screening Not Applicable  Skilled Nursing Facility Complete

## 2024-04-10 DIAGNOSIS — D649 Anemia, unspecified: Secondary | ICD-10-CM | POA: Diagnosis not present
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
## 2024-04-10 NOTE — Plan of Care (Signed)
  Problem: Education: Goal: Knowledge of the prescribed therapeutic regimen will improve Outcome: Progressing   Problem: Coping: Goal: Ability to identify and develop effective coping behavior will improve Outcome: Progressing   Problem: Clinical Measurements: Goal: Quality of life will improve Outcome: Progressing   Problem: Respiratory: Goal: Verbalizations of increased ease of respirations will increase Outcome: Progressing   Problem: Role Relationship: Goal: Family's ability to cope with current situation will improve Outcome: Progressing   Problem: Pain Management: Goal: Satisfaction with pain management regimen will improve Outcome: Progressing   Problem: Role Relationship: Goal: Ability to verbalize concerns, feelings, and thoughts to partner or family member will improve Outcome: Not Progressing

## 2024-04-11 DIAGNOSIS — D649 Anemia, unspecified: Secondary | ICD-10-CM | POA: Diagnosis not present
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
## 2024-04-11 NOTE — Plan of Care (Signed)
 Patient refused his medications and health teachings, also refused to do dressing change, foley care and vitals. Bed alarm on. Call bell within reached. Problem: Education: Goal: Knowledge of the prescribed therapeutic regimen will improve Outcome: Not Progressing   Problem: Coping: Goal: Ability to identify and develop effective coping behavior will improve Outcome: Not Progressing   Problem: Clinical Measurements: Goal: Quality of life will improve Outcome: Not Progressing   Problem: Respiratory: Goal: Verbalizations of increased ease of respirations will increase Outcome: Not Progressing   Problem: Role Relationship: Goal: Family's ability to cope with current situation will improve Outcome: Not Progressing Goal: Ability to verbalize concerns, feelings, and thoughts to partner or family member will improve Outcome: Not Progressing   Problem: Pain Management: Goal: Satisfaction with pain management regimen will improve Outcome: Not Progressing

## 2024-04-11 NOTE — TOC Progression Note (Signed)
 Transition of Care [facility]) - Progression Note    Patient Details  Name: Luke Velasquez MRN: [id number] Date of Birth: 2001-01-21  [facility] CM/SW Contact  Lauraine FORBES Saa, LCSWA Phone Number: 04/11/2024, 11:55 AM  Clinical Narrative:     11:55 AM CSW expanded SNF search in efforts to obtain a bed offer. Patient currently does not have any SNF bed offers. TOC will continue to follow and be available to assist.  Expected Discharge Plan: Skilled Nursing Facility Barriers to Discharge: Continued Medical Work up, English as a second language teacher, SNF Pending bed offer               Expected Discharge Plan and Services In-house Referral: Clinical Social Work   Post Acute Care Choice: Skilled Nursing Facility Living arrangements for the past 2 months: Single Family Home                                       Social Drivers of Health (SDOH) Interventions SDOH Screenings   Food Insecurity: No Food Insecurity (12/15/2023)  Recent Concern: Food Insecurity - Food Insecurity Present (12/01/2023)  Housing: Low Risk  (12/15/2023)  Transportation Needs: No Transportation Needs (12/15/2023)  Utilities: Not At Risk (12/15/2023)  Depression (PHQ2-9): Low Risk  (06/27/2020)  Recent Concern: Depression (PHQ2-9) - Medium Risk (05/16/2020)  Tobacco Use: High Risk (12/13/2023)    Readmission Risk Interventions    12/14/2023    2:24 PM  Readmission Risk Prevention Plan  Transportation Screening Complete  Medication Review (RN Care Manager) Complete  PCP or Specialist appointment within 3-5 days of discharge Complete  HRI or Home Care Consult Complete  SW Recovery Care/Counseling Consult Complete  Palliative Care Screening Not Applicable  Skilled Nursing Facility Complete

## 2024-04-12 DIAGNOSIS — D649 Anemia, unspecified: Secondary | ICD-10-CM | POA: Diagnosis not present

## 2024-04-12 NOTE — Plan of Care (Addendum)
 Patient refused for wound assessment as well dressing change. Problem: Education: Goal: Knowledge of the prescribed therapeutic regimen will improve Outcome: Not Progressing   Problem: Coping: Goal: Ability to identify and develop effective coping behavior will improve Outcome: Not Progressing   Problem: Role Relationship: Goal: Ability to verbalize concerns, feelings, and thoughts to partner or family member will improve Outcome: Not Progressing

## 2024-04-12 NOTE — Plan of Care (Signed)
 Patient is alert and oriented but still refuses teaching, wound dressing and meds on my shift. Problem: Education: Goal: Knowledge of the prescribed therapeutic regimen will improve Outcome: Not Progressing   Problem: Coping: Goal: Ability to identify and develop effective coping behavior will improve Outcome: Not Progressing   Problem: Clinical Measurements: Goal: Quality of life will improve Outcome: Not Progressing   Problem: Respiratory: Goal: Verbalizations of increased ease of respirations will increase Outcome: Not Progressing   Problem: Role Relationship: Goal: Family's ability to cope with current situation will improve Outcome: Not Progressing Goal: Ability to verbalize concerns, feelings, and thoughts to partner or family member will improve Outcome: Not Progressing   Problem: Pain Management: Goal: Satisfaction with pain management regimen will improve Outcome: Not Progressing

## 2024-04-12 NOTE — TOC Progression Note (Addendum)
 Transition of Care [facility]) - Progression Note    Patient Details  Name: Luke Velasquez MRN: [id number] Date of Birth: May 11, 2001  Transition of Care [facility]) CM/SW Contact  Lauraine FORBES Saa, LCSWA Phone Number: 04/12/2024, 11:19 AM  Clinical Narrative:     11:19 AM Per chart review, patient does not currently have any SNF bed offers. Patient currently has 49/76 SNF bed denials primary due to patient's age and insurance. CSW expanded SNF search to [place] in efforts to obtain a SNF bed offer. TOC will continue to follow and be available to assist.  Expected Discharge Plan: Skilled Nursing Facility Barriers to Discharge: Continued Medical Work up, English as a second language teacher, SNF Pending bed offer               Expected Discharge Plan and Services In-house Referral: Clinical Social Work   Post Acute Care Choice: Skilled Nursing Facility Living arrangements for the past 2 months: Single Family Home                                       Social Drivers of Health (SDOH) Interventions SDOH Screenings   Food Insecurity: No Food Insecurity (12/15/2023)  Recent Concern: Food Insecurity - Food Insecurity Present (12/01/2023)  Housing: Low Risk  (12/15/2023)  Transportation Needs: No Transportation Needs (12/15/2023)  Utilities: Not At Risk (12/15/2023)  Depression (PHQ2-9): Low Risk  (06/27/2020)  Recent Concern: Depression (PHQ2-9) - Medium Risk (05/16/2020)  Tobacco Use: High Risk (12/13/2023)    Readmission Risk Interventions    12/14/2023    2:24 PM  Readmission Risk Prevention Plan  Transportation Screening Complete  Medication Review (RN Care Manager) Complete  PCP or Specialist appointment within 3-5 days of discharge Complete  HRI or Home Care Consult Complete  SW Recovery Care/Counseling Consult Complete  Palliative Care Screening Not Applicable  Skilled Nursing Facility Complete
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
## 2024-04-13 DIAGNOSIS — D649 Anemia, unspecified: Secondary | ICD-10-CM | POA: Diagnosis not present

## 2024-04-13 NOTE — Plan of Care (Signed)
  Problem: Education: Goal: Knowledge of the prescribed therapeutic regimen will improve Outcome: Progressing   Problem: Coping: Goal: Ability to identify and develop effective coping behavior will improve Outcome: Progressing   Problem: Clinical Measurements: Goal: Quality of life will improve Outcome: Progressing   Problem: Respiratory: Goal: Verbalizations of increased ease of respirations will increase Outcome: Progressing   Problem: Role Relationship: Goal: Family's ability to cope with current situation will improve Outcome: Progressing Goal: Ability to verbalize concerns, feelings, and thoughts to partner or family member will improve Outcome: Progressing

## 2024-04-13 NOTE — TOC Progression Note (Addendum)
 Transition of Care [facility]) - Progression Note    Patient Details  Name: Luke Velasquez MRN: [id number] Date of Birth: Jan 20, 2001  [facility] CM/SW Contact  Lauraine FORBES Saa, LCSWA Phone Number: 04/13/2024, 12:51 PM  Clinical Narrative:     12:51 PM Per chart review, patient does not currently have any SNF bed offers. Patient has 54/90 SNF bed denials primary due to his insurance and age. Per [place] [facility] SNFs would need updated wound care to review referral, but patient is refusing wound care. TOC will continue to follow and be available to assist.  Expected Discharge Plan: Skilled Nursing Facility Barriers to Discharge: Continued Medical Work up, English as a second language teacher, SNF Pending bed offer               Expected Discharge Plan and Services In-house Referral: Clinical Social Work   Post Acute Care Choice: Skilled Nursing Facility Living arrangements for the past 2 months: Single Family Home                                       Social Drivers of Health (SDOH) Interventions SDOH Screenings   Food Insecurity: No Food Insecurity (12/15/2023)  Recent Concern: Food Insecurity - Food Insecurity Present (12/01/2023)  Housing: Low Risk  (12/15/2023)  Transportation Needs: No Transportation Needs (12/15/2023)  Utilities: Not At Risk (12/15/2023)  Depression (PHQ2-9): Low Risk  (06/27/2020)  Recent Concern: Depression (PHQ2-9) - Medium Risk (05/16/2020)  Tobacco Use: High Risk (12/13/2023)    Readmission Risk Interventions    12/14/2023    2:24 PM  Readmission Risk Prevention Plan  Transportation Screening Complete  Medication Review (RN Care Manager) Complete  PCP or Specialist appointment within 3-5 days of discharge Complete  HRI or Home Care Consult Complete  SW Recovery Care/Counseling Consult Complete  Palliative Care Screening Not Applicable  Skilled Nursing Facility Complete
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
## 2024-04-13 NOTE — Progress Notes (Signed)
 Pt still refusing wound care/drsg changes.

## 2024-04-14 DIAGNOSIS — D649 Anemia, unspecified: Secondary | ICD-10-CM | POA: Diagnosis not present

## 2024-04-14 NOTE — Plan of Care (Signed)
  Problem: Education: Goal: Knowledge of the prescribed therapeutic regimen will improve Outcome: Progressing   Problem: Coping: Goal: Ability to identify and develop effective coping behavior will improve Outcome: Progressing   Problem: Clinical Measurements: Goal: Quality of life will improve Outcome: Progressing   Problem: Respiratory: Goal: Verbalizations of increased ease of respirations will increase Outcome: Progressing   Problem: Role Relationship: Goal: Family's ability to cope with current situation will improve Outcome: Progressing   

## 2024-04-14 NOTE — Progress Notes (Signed)
 Pt refused all medications, dressing changes and assessment.
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
## 2024-04-14 NOTE — Plan of Care (Signed)
  Problem: Coping: Goal: Ability to identify and develop effective coping behavior will improve Outcome: Progressing   Problem: Clinical Measurements: Goal: Quality of life will improve Outcome: Progressing   Problem: Role Relationship: Goal: Ability to verbalize concerns, feelings, and thoughts to partner or family member will improve Outcome: Progressing

## 2024-04-15 DIAGNOSIS — D649 Anemia, unspecified: Secondary | ICD-10-CM | POA: Diagnosis not present

## 2024-04-15 NOTE — Plan of Care (Signed)
  Problem: Coping: Goal: Ability to identify and develop effective coping behavior will improve Outcome: Progressing   Problem: Respiratory: Goal: Verbalizations of increased ease of respirations will increase Outcome: Progressing   Problem: Role Relationship: Goal: Ability to verbalize concerns, feelings, and thoughts to partner or family member will improve Outcome: Progressing

## 2024-04-15 NOTE — Progress Notes (Addendum)
 Pt still refusing all medication and vitals, but he let me do dressing change and foley care. On call provider made aware. Will continue to monitor.
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
## 2024-04-16 DIAGNOSIS — D649 Anemia, unspecified: Secondary | ICD-10-CM | POA: Diagnosis not present

## 2024-04-16 NOTE — Progress Notes (Signed)
 Patient agreeable to minimal nursing assessment as documented. Refuses Foley care, wound dressing changes, and ostomy care at this time.

## 2024-04-16 NOTE — Progress Notes (Signed)
 Patient on comfort measures, reported in handoff lacks [facility] by neuropsych, frequently refuses care from staff. Patient sleeping on change of shift, allowed to rest for comfort. Rounding through morning, patient sleeping at each encounter. When awake at 1100, this RN introduced to patient for assessment, but patient immediately sated NO  loudly. Will reattempt care later.
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
## 2024-04-16 NOTE — Progress Notes (Signed)
 Patient continuing to refuse care from this RN. Patient states he is not in pain, nauseous, or in need of anything at this time. Advised patient to utilize call bell to make needs known.

## 2024-04-16 NOTE — Progress Notes (Signed)
Patient refused dressing change this morning. °

## 2024-04-17 DIAGNOSIS — D649 Anemia, unspecified: Secondary | ICD-10-CM | POA: Diagnosis not present
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
## 2024-04-17 NOTE — Progress Notes (Signed)
 Patient refused all attempts to assess, would not allow me to empty his colostomy bag or do any type of wound care

## 2024-04-17 NOTE — Plan of Care (Signed)
  Problem: Education: Goal: Knowledge of the prescribed therapeutic regimen will improve Outcome: Not Progressing   Problem: Clinical Measurements: Goal: Quality of life will improve Outcome: Not Progressing   Problem: Role Relationship: Goal: Ability to verbalize concerns, feelings, and thoughts to partner or family member will improve Outcome: Not Progressing

## 2024-04-17 NOTE — Plan of Care (Addendum)
 Refuses all medication, dressing changes and assessment.  Problem: Education: Goal: Knowledge of the prescribed therapeutic regimen will improve Outcome: Progressing    Problem: Coping: Goal: Ability to identify and develop effective coping behavior will improve Outcome: Progressing   Problem: Clinical Measurements: Goal: Quality of life will improve Outcome: Progressing   Problem: Respiratory: Goal: Verbalizations of increased ease of respirations will increase Outcome: Progressing

## 2024-04-18 DIAGNOSIS — D649 Anemia, unspecified: Secondary | ICD-10-CM | POA: Diagnosis not present

## 2024-04-18 NOTE — Progress Notes (Signed)
 Pt has refused all medications, assessment,  and maintenance of ostomy. Pt is alert and oriented x4.

## 2024-04-18 NOTE — Plan of Care (Signed)
°  Problem: Education: Goal: Knowledge of the prescribed therapeutic regimen will improve Outcome: Not Progressing   Problem: Coping: Goal: Ability to identify and develop effective coping behavior will improve Outcome: Not Progressing   Problem: Clinical Measurements: Goal: Quality of life will improve Outcome: Not Progressing   Problem: Respiratory: Goal: Verbalizations of increased ease of respirations will increase Outcome: Not Progressing   Problem: Role Relationship: Goal: Family's ability to cope with current situation will improve Outcome: Not Progressing Goal: Ability to verbalize concerns, feelings, and thoughts to partner or family member will improve Outcome: Not Progressing
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
## 2024-04-18 NOTE — Progress Notes (Signed)
 Pt continues to refuse care, including emptying of colostomy, pt would not acknowledge this Clinical research associate when questioned about wound care.

## 2024-04-18 NOTE — Plan of Care (Signed)
  Problem: Education: Goal: Knowledge of the prescribed therapeutic regimen will improve Outcome: Not Progressing   Problem: Coping: Goal: Ability to identify and develop effective coping behavior will improve Outcome: Not Progressing   Problem: Clinical Measurements: Goal: Quality of life will improve Outcome: Not Progressing

## 2024-04-19 DIAGNOSIS — D649 Anemia, unspecified: Secondary | ICD-10-CM | POA: Diagnosis not present

## 2024-04-19 NOTE — Progress Notes (Signed)
 Patient refused morning medication and supplements.  Refused foley care, ostomy care, oral care and dressing changes.  Will make another attempt this shift.
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
## 2024-04-19 NOTE — Plan of Care (Signed)
  Problem: Respiratory: Goal: Verbalizations of increased ease of respirations will increase 04/19/2024 1822 by Aldean Jeoffrey CROME, RN Outcome: Progressing 04/19/2024 1821 by Aldean Jeoffrey CROME, RN Outcome: Not Progressing   Problem: Education: Goal: Knowledge of the prescribed therapeutic regimen will improve 04/19/2024 1822 by Aldean Jeoffrey CROME, RN Outcome: Not Progressing 04/19/2024 1821 by Aldean Jeoffrey CROME, RN Outcome: Not Progressing   Problem: Coping: Goal: Ability to identify and develop effective coping behavior will improve 04/19/2024 1822 by Aldean Jeoffrey CROME, RN Outcome: Not Progressing 04/19/2024 1821 by Aldean Jeoffrey CROME, RN Outcome: Not Progressing   Problem: Clinical Measurements: Goal: Quality of life will improve 04/19/2024 1822 by Aldean Jeoffrey CROME, RN Outcome: Not Progressing 04/19/2024 1821 by Aldean Jeoffrey CROME, RN Outcome: Not Progressing   Problem: Role Relationship: Goal: Family's ability to cope with current situation will improve 04/19/2024 1822 by Aldean Jeoffrey CROME, RN Outcome: Not Progressing 04/19/2024 1821 by Aldean Jeoffrey CROME, RN Outcome: Not Progressing Goal: Ability to verbalize concerns, feelings, and thoughts to partner or family member will improve 04/19/2024 1822 by Aldean Jeoffrey CROME, RN Outcome: Not Progressing 04/19/2024 1821 by Aldean Jeoffrey CROME, RN Outcome: Not Progressing

## 2024-04-20 DIAGNOSIS — D649 Anemia, unspecified: Secondary | ICD-10-CM | POA: Diagnosis not present

## 2024-04-20 NOTE — Plan of Care (Signed)
  Problem: Education: Goal: Knowledge of the prescribed therapeutic regimen will improve Outcome: Progressing   Problem: Coping: Goal: Ability to identify and develop effective coping behavior will improve Outcome: Progressing   Problem: Clinical Measurements: Goal: Quality of life will improve Outcome: Progressing   Problem: Respiratory: Goal: Verbalizations of increased ease of respirations will increase Outcome: Progressing   Problem: Role Relationship: Goal: Family's ability to cope with current situation will improve Outcome: Progressing   
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
## 2024-04-20 NOTE — Plan of Care (Signed)
  Problem: Respiratory: Goal: Verbalizations of increased ease of respirations will increase Outcome: Progressing   Problem: Education: Goal: Knowledge of the prescribed therapeutic regimen will improve Outcome: Not Progressing   Problem: Coping: Goal: Ability to identify and develop effective coping behavior will improve Outcome: Not Progressing   Problem: Clinical Measurements: Goal: Quality of life will improve Outcome: Not Progressing   Problem: Role Relationship: Goal: Family's ability to cope with current situation will improve Outcome: Not Progressing Goal: Ability to verbalize concerns, feelings, and thoughts to partner or family member will improve Outcome: Not Progressing

## 2024-04-21 DIAGNOSIS — D649 Anemia, unspecified: Secondary | ICD-10-CM | POA: Diagnosis not present

## 2024-04-21 NOTE — Plan of Care (Signed)
  Problem: Education: Goal: Knowledge of the prescribed therapeutic regimen will improve Outcome: Progressing   Problem: Coping: Goal: Ability to identify and develop effective coping behavior will improve Outcome: Progressing   Problem: Clinical Measurements: Goal: Quality of life will improve Outcome: Progressing   Problem: Respiratory: Goal: Verbalizations of increased ease of respirations will increase Outcome: Progressing   Problem: Role Relationship: Goal: Family's ability to cope with current situation will improve Outcome: Progressing   

## 2024-04-21 NOTE — Progress Notes (Signed)
 Pt refused all medications, assessment and wound care. He was also crying at the beginning of the shift and I asked him numerous times throughout the night was he ok and would he at least let me change his dressings. He refused every time. MD made aware.

## 2024-04-21 NOTE — Progress Notes (Signed)
 Offered to provide wound care and a bath for the second time, patient declined stating that he would get it done in the morning.
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
## 2024-04-21 NOTE — Progress Notes (Signed)
 Went into room with the patient's mother and told him I would come back at 4 pm to change his foley, dressings and allow him to bathe himself.

## 2024-04-21 NOTE — Plan of Care (Signed)
  Problem: Role Relationship: Goal: Family's ability to cope with current situation will improve Outcome: Progressing Goal: Ability to verbalize concerns, feelings, and thoughts to partner or family member will improve Outcome: Progressing   Problem: Education: Goal: Knowledge of the prescribed therapeutic regimen will improve Outcome: Not Progressing   Problem: Coping: Goal: Ability to identify and develop effective coping behavior will improve Outcome: Not Progressing

## 2024-04-21 NOTE — Progress Notes (Signed)
 Patient declined to have wound care done, offered just a bath and patient declined.

## 2024-04-22 DIAGNOSIS — D649 Anemia, unspecified: Secondary | ICD-10-CM | POA: Diagnosis not present

## 2024-04-22 MED ORDER — CHLORHEXIDINE GLUCONATE CLOTH 2 % EX PADS
6.0000 | MEDICATED_PAD | Freq: Every day | CUTANEOUS | Status: DC
Start: 2024-04-22 — End: 2024-05-11
  Administered 2024-04-27 – 2024-05-09 (×6): 6 via TOPICAL
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
## 2024-04-22 NOTE — Progress Notes (Signed)
 Pt refused CBG bath, medications and assessment this evening. Made MD aware.

## 2024-04-22 NOTE — Plan of Care (Signed)
  Problem: Education: Goal: Knowledge of the prescribed therapeutic regimen will improve Outcome: Progressing   Problem: Coping: Goal: Ability to identify and develop effective coping behavior will improve Outcome: Progressing   Problem: Clinical Measurements: Goal: Quality of life will improve Outcome: Progressing   Problem: Respiratory: Goal: Verbalizations of increased ease of respirations will increase Outcome: Progressing   Problem: Role Relationship: Goal: Family's ability to cope with current situation will improve Outcome: Progressing   

## 2024-04-22 NOTE — Progress Notes (Signed)
 Patient refusing this nurse to administer medications, perform a head to toe assessment including looking at his ostomy/foley cath and providing care to those. Patient had face covered with blanket during entire interaction refusing to lower the blanket to talk. Offered to change patients wound care dressings and he continued to refuse all care. Will offer again at a later time.

## 2024-04-22 NOTE — Progress Notes (Signed)
 Pt refused medications, foley care, ostomy care and declined any hygiene measures. He was asked numerous times throughout the night about his needs, wants and different hygiene measures and refused every time. MD made aware.

## 2024-04-22 NOTE — Progress Notes (Signed)
 Patient was bathed, foley catheter exchanged, ostomy bag changed, and wound care completed on all wounds.

## 2024-04-23 DIAGNOSIS — D649 Anemia, unspecified: Secondary | ICD-10-CM | POA: Diagnosis not present

## 2024-04-23 NOTE — Plan of Care (Signed)
  Problem: Clinical Measurements: Goal: Quality of life will improve Outcome: Not Progressing   Problem: Role Relationship: Goal: Ability to verbalize concerns, feelings, and thoughts to partner or family member will improve Outcome: Not Progressing

## 2024-04-23 NOTE — Plan of Care (Signed)
  Problem: Education: Goal: Knowledge of the prescribed therapeutic regimen will improve Outcome: Progressing   Problem: Coping: Goal: Ability to identify and develop effective coping behavior will improve Outcome: Progressing   Problem: Clinical Measurements: Goal: Quality of life will improve Outcome: Progressing   Problem: Respiratory: Goal: Verbalizations of increased ease of respirations will increase Outcome: Progressing   Problem: Role Relationship: Goal: Family's ability to cope with current situation will improve Outcome: Progressing   

## 2024-04-23 NOTE — Progress Notes (Signed)
 Pt is unwilling to have a complete head to toe done today. He has different disciplinary visit today and has deny them all as well. Pt is more withdrawn today than has been for me in the past. I did reach out to the chaplin to came and speak with the patient but he patient was not up for the visit today. Will endorse to upcoming nurse the changes to wound care if patient does participate in treatments.

## 2024-04-23 NOTE — Consult Note (Signed)
 Reconsult regarding chronic wounds to sacrum, L trochanter, L lateral leg; recalcitrant condition.  Patient declined assessment of wounds, explained importance of having wounds checked by WOC today in order to take updated images and have wounds fully evaluated, spoke to primary nurse in room and she will relay information that images are needed weekly, for now will make changes to wound care orders by holding Aquacel (silver) dressings since it appears this dressing has been used since 5/25, long term use is not advised and may impede healing, will switch to plain calcium alginate dressings with a barrier film to periwound to buffer from drainage, explained this to nurse and patient, patient gave thumbs-up sign with his finger. Reviewed psychiatric note regarding patients depressive state, it appears patient has history of declining treatment and medication regime. Patient had many snacks and food at the bedside and explained importance of eating well to improve favorable outcomes to his wounds healing.   Please reconsult if wound worsens in condition and notify provider.   Sherrilyn Hals MSN RN [facility]  [phone] (Available from 7-3 pm Mon-Friday)
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
## 2024-04-23 NOTE — Progress Notes (Signed)
 Patient allowed me to listen to him if I got him a cola. Done. Refused all meds or any other assistance.

## 2024-04-24 DIAGNOSIS — D649 Anemia, unspecified: Secondary | ICD-10-CM | POA: Diagnosis not present

## 2024-04-24 NOTE — Plan of Care (Signed)
  Problem: Coping: Goal: Ability to identify and develop effective coping behavior will improve Outcome: Progressing   Problem: Education: Goal: Knowledge of the prescribed therapeutic regimen will improve Outcome: Not Progressing
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
## 2024-04-24 NOTE — Progress Notes (Signed)
 Nurse has been in pt room a couple times and pt refused any assessment ,medication , or treatments thus far.  Will circle back later to tried again.

## 2024-04-24 NOTE — Plan of Care (Signed)
  Problem: Education: Goal: Knowledge of the prescribed therapeutic regimen will improve Outcome: Not Met (add Reason)   Problem: Coping: Goal: Ability to identify and develop effective coping behavior will improve Outcome: Not Met (add Reason)   Problem: Clinical Measurements: Goal: Quality of life will improve Outcome: Not Met (add Reason)   Problem: Respiratory: Goal: Verbalizations of increased ease of respirations will increase Outcome: Not Met (add Reason)   Problem: Role Relationship: Goal: Family's ability to cope with current situation will improve Outcome: Not Met (add Reason) Goal: Ability to verbalize concerns, feelings, and thoughts to partner or family member will improve Outcome: Not Met (add Reason)  Pt is withdrawn and has not eagerness to learn.

## 2024-04-25 DIAGNOSIS — D649 Anemia, unspecified: Secondary | ICD-10-CM | POA: Diagnosis not present

## 2024-04-25 NOTE — Progress Notes (Signed)
 Patient refused all meds and assessments

## 2024-04-25 NOTE — Plan of Care (Signed)
  Problem: Education: Goal: Knowledge of the prescribed therapeutic regimen will improve Outcome: Not Progressing   Problem: Coping: Goal: Ability to identify and develop effective coping behavior will improve Outcome: Not Progressing   Problem: Clinical Measurements: Goal: Quality of life will improve Outcome: Not Progressing   Problem: Role Relationship: Goal: Family's ability to cope with current situation will improve Outcome: Not Progressing

## 2024-04-25 NOTE — Progress Notes (Signed)
 Patient refused all meds and assessment, will continue efforts at a later time.

## 2024-04-25 NOTE — Plan of Care (Signed)
  Problem: Coping: Goal: Ability to identify and develop effective coping behavior will improve Outcome: Not Progressing Note: Patient refused care and meds

## 2024-04-25 NOTE — Progress Notes (Signed)
 RN reattempts to do dressing change/wound care and hygiene, patient refused.

## 2024-04-25 NOTE — Progress Notes (Signed)
Patient refused all evening meds.
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
## 2024-04-26 DIAGNOSIS — D649 Anemia, unspecified: Secondary | ICD-10-CM | POA: Diagnosis not present

## 2024-04-26 NOTE — Plan of Care (Signed)
  Problem: Education: Goal: Knowledge of the prescribed therapeutic regimen will improve Outcome: Progressing   Problem: Respiratory: Goal: Verbalizations of increased ease of respirations will increase Outcome: Progressing   Problem: Role Relationship: Goal: Ability to verbalize concerns, feelings, and thoughts to partner or family member will improve Outcome: Progressing   Problem: Coping: Goal: Ability to identify and develop effective coping behavior will improve Outcome: Not Progressing   Problem: Clinical Measurements: Goal: Quality of life will improve Outcome: Not Progressing   Problem: Role Relationship: Goal: Family's ability to cope with current situation will improve Outcome: Not Progressing

## 2024-04-26 NOTE — TOC Progression Note (Signed)
 Transition of Care [facility]) - Progression Note    Patient Details  Name: Luke Velasquez MRN: [id number] Date of Birth: April 30, 2001  [facility] CM/SW Contact  Laurance Heide [name] [place], [place] Phone Number: 04/26/2024, 11:33 AM  Clinical Narrative:    Pt still has no SNF offers. CSW will continue to follow.   Expected Discharge Plan: Skilled Nursing Facility Barriers to Discharge: Continued Medical Work up, English as a second language teacher, SNF Pending bed offer               Expected Discharge Plan and Services In-house Referral: Clinical Social Work   Post Acute Care Choice: Skilled Nursing Facility Living arrangements for the past 2 months: Single Family Home                                       Social Drivers of Health (SDOH) Interventions SDOH Screenings   Food Insecurity: No Food Insecurity (12/15/2023)  Recent Concern: Food Insecurity - Food Insecurity Present (12/01/2023)  Housing: Low Risk  (12/15/2023)  Transportation Needs: No Transportation Needs (12/15/2023)  Utilities: Not At Risk (12/15/2023)  Depression (PHQ2-9): Low Risk  (06/27/2020)  Recent Concern: Depression (PHQ2-9) - Medium Risk (05/16/2020)  Tobacco Use: High Risk (12/13/2023)    Readmission Risk Interventions    12/14/2023    2:24 PM  Readmission Risk Prevention Plan  Transportation Screening Complete  Medication Review (RN Care Manager) Complete  PCP or Specialist appointment within 3-5 days of discharge Complete  HRI or Home Care Consult Complete  SW Recovery Care/Counseling Consult Complete  Palliative Care Screening Not Applicable  Skilled Nursing Facility Complete

## 2024-04-26 NOTE — Plan of Care (Signed)
  Problem: Coping: Goal: Ability to identify and develop effective coping behavior will improve Outcome: Not Progressing   Problem: Role Relationship: Goal: Family's ability to cope with current situation will improve Outcome: Not Progressing Goal: Ability to verbalize concerns, feelings, and thoughts to partner or family member will improve Outcome: Not Progressing
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
## 2024-04-27 DIAGNOSIS — D649 Anemia, unspecified: Secondary | ICD-10-CM | POA: Diagnosis not present

## 2024-04-27 NOTE — Plan of Care (Signed)
  Problem: Education: Goal: Knowledge of the prescribed therapeutic regimen will improve Outcome: Not Progressing   Problem: Coping: Goal: Ability to identify and develop effective coping behavior will improve Outcome: Not Progressing   Problem: Clinical Measurements: Goal: Quality of life will improve Outcome: Not Progressing
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
## 2024-04-28 DIAGNOSIS — D649 Anemia, unspecified: Secondary | ICD-10-CM | POA: Diagnosis not present
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
## 2024-04-29 DIAGNOSIS — D649 Anemia, unspecified: Secondary | ICD-10-CM | POA: Diagnosis not present

## 2024-04-29 NOTE — Progress Notes (Signed)
 Pt requested ginger ale. It was provided. He continues to refuse care.

## 2024-04-29 NOTE — Progress Notes (Signed)
 Pt is refusing care. I told him if he needs anything please call. He said he would. I will continue to monitor him.

## 2024-04-29 NOTE — Plan of Care (Deleted)
  Problem: Education: Goal: Knowledge of the prescribed therapeutic regimen will improve Outcome: Progressing   Problem: Coping: Goal: Ability to identify and develop effective coping behavior will improve Outcome: Progressing   Problem: Clinical Measurements: Goal: Quality of life will improve Outcome: Progressing

## 2024-04-29 NOTE — Progress Notes (Signed)
 Pt refused assessment and meds this am.
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
## 2024-04-29 NOTE — Plan of Care (Signed)
?  Problem: Clinical Measurements: ?Goal: Quality of life will improve ?Outcome: Not Progressing ?  ?

## 2024-04-30 DIAGNOSIS — D649 Anemia, unspecified: Secondary | ICD-10-CM | POA: Diagnosis not present

## 2024-04-30 NOTE — Plan of Care (Signed)
  Problem: Education: Goal: Knowledge of the prescribed therapeutic regimen will improve Outcome: Progressing   Problem: Coping: Goal: Ability to identify and develop effective coping behavior will improve Outcome: Progressing   Problem: Clinical Measurements: Goal: Quality of life will improve Outcome: Progressing   Problem: Respiratory: Goal: Verbalizations of increased ease of respirations will increase Outcome: Progressing   Problem: Role Relationship: Goal: Family's ability to cope with current situation will improve Outcome: Progressing   

## 2024-04-30 NOTE — Progress Notes (Signed)
 Pt refused all meds and assessment, he did order pizza and it was given to him by the NT.
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
## 2024-05-01 DIAGNOSIS — D649 Anemia, unspecified: Secondary | ICD-10-CM | POA: Diagnosis not present
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
## 2024-05-01 NOTE — Plan of Care (Signed)
Patient refused dressing changes

## 2024-05-01 NOTE — Progress Notes (Signed)
 Patient allowed RN to perform dressing changes and bath. Ostomy bag changed, bed was wiped down. CHG bath and foley care given. He was agreeable to go outside with RN to get fresh air. Patient back on the unit and sitting on his wheelchair.  Educated to call for assistance.

## 2024-05-02 DIAGNOSIS — D649 Anemia, unspecified: Secondary | ICD-10-CM | POA: Diagnosis not present
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
## 2024-05-02 NOTE — Progress Notes (Signed)
 Patient refused his bedtime medication,as well as dressing changes and assessment,will continue to monitor

## 2024-05-02 NOTE — Progress Notes (Signed)
 Patient awake, lying on bed. Bed noted with empty wrappers of [employer] candy, empty bags of doritos chips, ruffles and Frito lays and jerky. He is not engaging in conversation. Responds with uh-uh to assessment questions. Patient lying prone on bed. Refuses to reposition on his back. Not cooperative. Limited assessment done. Refuses po meds. Refused VS  Denies pain, no SOB or labored breathing. Patient lying on ostomy bag. Foley cath draining, no kinks. Call bell in reach.

## 2024-05-02 NOTE — Plan of Care (Signed)
  Problem: Coping: Goal: Ability to identify and develop effective coping behavior will improve Outcome: Progressing   

## 2024-05-02 NOTE — TOC Progression Note (Signed)
 Transition of Care [facility]) - Progression Note    Patient Details  Name: Lawrnce Reyez MRN: [id number] Date of Birth: 11/17/2000  [facility] CM/SW Contact  Alden Feagan [name] [place], [place] Phone Number: 05/02/2024, 1:01 PM  Clinical Narrative:    CSW attempted to call pt mother regarding pt discharge plan but did not receive a response. CSW will continue to follow.   Expected Discharge Plan: Skilled Nursing Facility Barriers to Discharge: Continued Medical Work up, English as a second language teacher, SNF Pending bed offer               Expected Discharge Plan and Services In-house Referral: Clinical Social Work   Post Acute Care Choice: Skilled Nursing Facility Living arrangements for the past 2 months: Single Family Home                                       Social Drivers of Health (SDOH) Interventions SDOH Screenings   Food Insecurity: No Food Insecurity (12/15/2023)  Recent Concern: Food Insecurity - Food Insecurity Present (12/01/2023)  Housing: Low Risk  (12/15/2023)  Transportation Needs: No Transportation Needs (12/15/2023)  Utilities: Not At Risk (12/15/2023)  Depression (PHQ2-9): Low Risk  (06/27/2020)  Recent Concern: Depression (PHQ2-9) - Medium Risk (05/16/2020)  Tobacco Use: High Risk (12/13/2023)    Readmission Risk Interventions    12/14/2023    2:24 PM  Readmission Risk Prevention Plan  Transportation Screening Complete  Medication Review (RN Care Manager) Complete  PCP or Specialist appointment within 3-5 days of discharge Complete  HRI or Home Care Consult Complete  SW Recovery Care/Counseling Consult Complete  Palliative Care Screening Not Applicable  Skilled Nursing Facility Complete

## 2024-05-03 DIAGNOSIS — D649 Anemia, unspecified: Secondary | ICD-10-CM | POA: Diagnosis not present

## 2024-05-03 NOTE — Progress Notes (Signed)
 [facility] Liaison Note Palliative Referral  We will close our outpatient palliative referral for Mr Luke Velasquez.  Once his discharge disposition has been determined, [facility] will pick him back up.  Please call for any questions or concerns.  Thank you Luke Velasquez BSN [facility] Liaison [phone]

## 2024-05-03 NOTE — Care Plan (Signed)
 Patient agreed to go outside with RN to get fresh air. Stayed for approximately 15 mins outside and went back to room. Patient allowed to get his vitals checked but still refuse his med. Patient went to hallway by the window and stayed there to eat his breakfast. No complaints of pain.
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
## 2024-05-03 NOTE — TOC Progression Note (Signed)
 Transition of Care [facility]) - Progression Note    Patient Details  Name: Paymon Rosensteel MRN: [id number] Date of Birth: [birth year]  Transition of [facility]) CM/SW Contact  Milton Streicher [name] Moose, [place] Phone Number: 05/03/2024, 1:51 PM  Clinical Narrative:    CSW spoke with Jamee Breeding with [facility] about pt dc plan. Jamee stated she is going to see if she can contact pt mom and come up with alternative options for pt to dc. CSW will continue to follow. Expected Discharge Plan: Skilled Nursing Facility Barriers to Discharge: Continued Medical Work up, English as a second language teacher, SNF Pending bed offer               Expected Discharge Plan and Services In-house Referral: Clinical Social Work   Post Acute Care Choice: Skilled Nursing Facility Living arrangements for the past 2 months: Single Family Home                                       Social Drivers of Health (SDOH) Interventions SDOH Screenings   Food Insecurity: No Food Insecurity (12/15/2023)  Recent Concern: Food Insecurity - Food Insecurity Present (12/01/2023)  Housing: Low Risk  (12/15/2023)  Transportation Needs: No Transportation Needs (12/15/2023)  Utilities: Not At Risk (12/15/2023)  Depression (PHQ2-9): Low Risk  (06/27/2020)  Recent Concern: Depression (PHQ2-9) - Medium Risk (05/16/2020)  Tobacco Use: High Risk (12/13/2023)    Readmission Risk Interventions    12/14/2023    2:24 PM  Readmission Risk Prevention Plan  Transportation Screening Complete  Medication Review (RN Care Manager) Complete  PCP or Specialist appointment within 3-5 days of discharge Complete  HRI or Home Care Consult Complete  SW Recovery Care/Counseling Consult Complete  Palliative Care Screening Not Applicable  Skilled Nursing Facility Complete

## 2024-05-03 NOTE — Progress Notes (Signed)
 Patient refused meds, foley and peri care, and shift assessment.

## 2024-05-03 NOTE — Plan of Care (Signed)
  Problem: Education: Goal: Knowledge of the prescribed therapeutic regimen will improve Outcome: Progressing   Problem: Coping: Goal: Ability to identify and develop effective coping behavior will improve Outcome: Progressing   Problem: Clinical Measurements: Goal: Quality of life will improve Outcome: Progressing   Problem: Respiratory: Goal: Verbalizations of increased ease of respirations will increase Outcome: Progressing   Problem: Role Relationship: Goal: Family's ability to cope with current situation will improve Outcome: Progressing Goal: Ability to verbalize concerns, feelings, and thoughts to partner or family member will improve Outcome: Progressing

## 2024-05-04 DIAGNOSIS — D649 Anemia, unspecified: Secondary | ICD-10-CM | POA: Diagnosis not present

## 2024-05-04 NOTE — Plan of Care (Signed)
 Patient agreed to go off unit with RN. Stayed outside approximately but refused his meds and wound care and assessment. Patient stayed on his wheelchair and stayed by the window outside his room. Problem: Education: Goal: Knowledge of the prescribed therapeutic regimen will improve Outcome: Progressing   Problem: Coping: Goal: Ability to identify and develop effective coping behavior will improve Outcome: Progressing   Problem: Clinical Measurements: Goal: Quality of life will improve Outcome: Progressing   Problem: Respiratory: Goal: Verbalizations of increased ease of respirations will increase Outcome: Progressing   Problem: Role Relationship: Goal: Family's ability to cope with current situation will improve Outcome: Progressing Goal: Ability to verbalize concerns, feelings, and thoughts to partner or family member will improve Outcome: Progressing

## 2024-05-04 NOTE — Plan of Care (Signed)
   Problem: Education: Goal: Knowledge of the prescribed therapeutic regimen will improve Outcome: Progressing   Problem: Coping: Goal: Ability to identify and develop effective coping behavior will improve Outcome: Progressing
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
## 2024-05-05 DIAGNOSIS — D649 Anemia, unspecified: Secondary | ICD-10-CM | POA: Diagnosis not present
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
## 2024-05-05 NOTE — Progress Notes (Signed)
 Pt. Refuses assessment and dressing changes this shift.

## 2024-05-05 NOTE — Progress Notes (Signed)
 Patient refusing wound care/assessment this AM. Said not rt now, maybe later.

## 2024-05-06 DIAGNOSIS — D649 Anemia, unspecified: Secondary | ICD-10-CM | POA: Diagnosis not present

## 2024-05-06 NOTE — Progress Notes (Signed)
 Patient refused shift assessment, foley's care and refused to take the medicine, which he initially agreed to take but later refused and had to waste the medicine ,witnessed  by second RN.

## 2024-05-06 NOTE — Plan of Care (Signed)
  Problem: Coping: Goal: Ability to identify and develop effective coping behavior will improve Outcome: Not Met (add Reason)   Problem: Clinical Measurements: Goal: Quality of life will improve Outcome: Not Met (add Reason)   Problem: Respiratory: Goal: Verbalizations of increased ease of respirations will increase Outcome: Not Met (add Reason)   Problem: Role Relationship: Goal: Family's ability to cope with current situation will improve Outcome: Not Met (add Reason) Goal: Ability to verbalize concerns, feelings, and thoughts to partner or family member will improve Outcome: Not Met (add Reason)    Pt is refusing treatment.

## 2024-05-06 NOTE — Progress Notes (Signed)
 Patient allow nurse to change ostomy bag this morning, but not able to change any other dressing. Patient he would allow them to change tonight or in the morning.
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
## 2024-05-07 DIAGNOSIS — D649 Anemia, unspecified: Secondary | ICD-10-CM | POA: Diagnosis not present
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
## 2024-05-07 NOTE — Plan of Care (Signed)
  Problem: Coping: Goal: Ability to identify and develop effective coping behavior will improve Outcome: Progressing   

## 2024-05-08 DIAGNOSIS — D649 Anemia, unspecified: Secondary | ICD-10-CM | POA: Diagnosis not present
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
## 2024-05-08 NOTE — Plan of Care (Signed)
 Refuses Juven.  CHG and foley care given.  Pt out of room and in the hallway throughout the day.  Problem: Coping: Goal: Ability to identify and develop effective coping behavior will improve Outcome: Progressing   Problem: Clinical Measurements: Goal: Quality of life will improve Outcome: Progressing   Problem: Respiratory: Goal: Verbalizations of increased ease of respirations will increase Outcome: Progressing

## 2024-05-08 NOTE — TOC Progression Note (Signed)
 Transition of Care [facility]) - Progression Note    Patient Details  Name: Luke Velasquez MRN: [id number] Date of Birth: [birth year]  [facility] CM/SW Contact  [name] [name] [place], [place] Phone Number: 05/08/2024, 8:35 AM  Clinical Narrative:    Pt currently does not have and SNF offers, CSW has contacted pt mother but does not receive a response. TOC leadership is aware of situation. CSW will continue to follow.   Expected Discharge Plan: Skilled Nursing Facility Barriers to Discharge: Continued Medical Work up, English as a second language teacher, SNF Pending bed offer               Expected Discharge Plan and Services In-house Referral: Clinical Social Work   Post Acute Care Choice: Skilled Nursing Facility Living arrangements for the past 2 months: Single Family Home                                       Social Drivers of Health (SDOH) Interventions SDOH Screenings   Food Insecurity: No Food Insecurity (12/15/2023)  Recent Concern: Food Insecurity - Food Insecurity Present (12/01/2023)  Housing: Low Risk  (12/15/2023)  Transportation Needs: No Transportation Needs (12/15/2023)  Utilities: Not At Risk (12/15/2023)  Depression (PHQ2-9): Low Risk  (06/27/2020)  Recent Concern: Depression (PHQ2-9) - Medium Risk (05/16/2020)  Tobacco Use: High Risk (12/13/2023)    Readmission Risk Interventions    12/14/2023    2:24 PM  Readmission Risk Prevention Plan  Transportation Screening Complete  Medication Review (RN Care Manager) Complete  PCP or Specialist appointment within 3-5 days of discharge Complete  HRI or Home Care Consult Complete  SW Recovery Care/Counseling Consult Complete  Palliative Care Screening Not Applicable  Skilled Nursing Facility Complete

## 2024-05-09 DIAGNOSIS — D649 Anemia, unspecified: Secondary | ICD-10-CM | POA: Diagnosis not present

## 2024-05-09 NOTE — Plan of Care (Signed)
 Patient agreeable for wound dressings.  Wound consult placed to reassess wounds and update LDA documentation.  Problem: Coping: Goal: Ability to identify and develop effective coping behavior will improve Outcome: Progressing   Problem: Clinical Measurements: Goal: Quality of life will improve Outcome: Progressing   Problem: Respiratory: Goal: Verbalizations of increased ease of respirations will increase Outcome: Progressing
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
## 2024-05-10 DIAGNOSIS — D649 Anemia, unspecified: Secondary | ICD-10-CM | POA: Diagnosis not present

## 2024-05-10 NOTE — Progress Notes (Signed)
 Pt refused to take his VS from the NT, Pt agreed to get his Temp taken by this nurse,  but refused other VS. Pt temp was 100.1 F, Tylenol  was offered, pt refused. Pt also refused his foley care, educated about the importance of wound care, continued to refuse.
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
## 2024-05-10 NOTE — Progress Notes (Signed)
 Pt is refusing assessment at this time. Will follow up at a later time. Pt is resting in bed with drinks and soiled pillow case. Pt confirm that he vomit last night. Sign of vomit on the pillow case and change.

## 2024-05-10 NOTE — Plan of Care (Signed)
  Problem: Respiratory: Goal: Verbalizations of increased ease of respirations will increase Outcome: Progressing   Problem: Role Relationship: Goal: Ability to verbalize concerns, feelings, and thoughts to partner or family member will improve Outcome: Not Progressing
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
## 2024-05-11 DIAGNOSIS — D649 Anemia, unspecified: Secondary | ICD-10-CM | POA: Diagnosis not present

## 2024-05-11 NOTE — Plan of Care (Signed)
  Problem: Respiratory: Goal: Verbalizations of increased ease of respirations will increase Outcome: Progressing   
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
## 2024-05-12 DIAGNOSIS — D649 Anemia, unspecified: Secondary | ICD-10-CM | POA: Diagnosis not present

## 2024-05-12 NOTE — Progress Notes (Signed)
 Patient educated about the importance of wound care and foley care but  he refused ,also refused for the vital sign to be taken .
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
## 2024-05-12 NOTE — Progress Notes (Signed)
 Patient colostomy was leaking ,changed the bag ,Patient was shivering and warm to touch but refused vitals and night meds. But got  convinced  to do the dressing change and foley care .

## 2024-05-12 NOTE — Progress Notes (Addendum)
 Pt refused assessment and dressing change, including VS. MD was made aware.   1819: Pt is feeling chills, but refused VS check. This RN provided 2 warm blankets for comfort.

## 2024-05-12 NOTE — TOC Progression Note (Signed)
 Transition of Care [facility]) - Progression Note    Patient Details  Name: Marrio Scribner MRN: [id number] Date of Birth: 10/03/2000  [facility] CM/SW Contact  Inocente GORMAN Kindle, LCSW Phone Number: 05/12/2024, 8:41 AM  Clinical Narrative:    Inpatient Care Management continuing to follow. Discharge barriers remain in place. Leadership aware.     Expected Discharge Plan: Skilled Nursing Facility Barriers to Discharge: Continued Medical Work up, English As A Second Language Teacher, SNF Pending bed offer               Expected Discharge Plan and Services In-house Referral: Clinical Social Work   Post Acute Care Choice: Skilled Nursing Facility Living arrangements for the past 2 months: Single Family Home                                       Social Drivers of Health (SDOH) Interventions SDOH Screenings   Food Insecurity: No Food Insecurity (12/15/2023)  Recent Concern: Food Insecurity - Food Insecurity Present (12/01/2023)  Housing: Low Risk  (12/15/2023)  Transportation Needs: No Transportation Needs (12/15/2023)  Utilities: Not At Risk (12/15/2023)  Depression (PHQ2-9): Low Risk  (06/27/2020)  Recent Concern: Depression (PHQ2-9) - Medium Risk (05/16/2020)  Tobacco Use: High Risk (12/13/2023)    Readmission Risk Interventions    12/14/2023    2:24 PM  Readmission Risk Prevention Plan  Transportation Screening Complete  Medication Review (RN Care Manager) Complete  PCP or Specialist appointment within 3-5 days of discharge Complete  HRI or Home Care Consult Complete  SW Recovery Care/Counseling Consult Complete  Palliative Care Screening Not Applicable  Skilled Nursing Facility Complete

## 2024-05-13 DIAGNOSIS — D649 Anemia, unspecified: Secondary | ICD-10-CM | POA: Diagnosis not present

## 2024-05-13 NOTE — Plan of Care (Signed)
  Problem: Clinical Measurements: Goal: Quality of life will improve Outcome: Not Progressing   Problem: Role Relationship: Goal: Ability to verbalize concerns, feelings, and thoughts to partner or family member will improve Outcome: Not Progressing
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
## 2024-05-13 NOTE — Progress Notes (Signed)
 Called in to patients room this evening by NT reporting patients bed was wet. Assessed the patient and discovered that his foley was leaking. Patient agreed to have foley replaced. Nurse removed the old foley as replaced with a 14FR with no complaints of pain or discomfort. Dressings changed to B hips, buttocks, and sacrum. Patient felt very warm during bed mobility and dressing changes. Patient refused to have vitals taken. He also refused PRN tylenol . Patients mother present during wound changes and foley catheter change. Also made aware of the patients refusal to have vitals taken and tylenol .

## 2024-05-14 DIAGNOSIS — D649 Anemia, unspecified: Secondary | ICD-10-CM | POA: Diagnosis not present

## 2024-05-14 NOTE — Plan of Care (Addendum)
 Patient refused for his shift assessment ,educated about importance of wound care ,he got irritated and said RN to 'get out ,also denied for vitals  check and wound dressing.  Problem: Coping: Goal: Ability to identify and develop effective coping behavior will improve Outcome: Not Progressing   Problem: Clinical Measurements: Goal: Quality of life will improve Outcome: Not Progressing   Problem: Role Relationship: Goal: Ability to verbalize concerns, feelings, and thoughts to partner or family member will improve Outcome: Not Progressing
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
## 2024-05-14 NOTE — Progress Notes (Signed)
 Mother came to visit patient stated that she got him to allow her to change his linens but he is still refusing medications of all kind.  Stated that even when he was staying with her he would take his colostomy off and toss it.  He would be up late at night talking to himself.  She states that she is trying to help find some place for him to go.

## 2024-05-14 NOTE — TOC Progression Note (Signed)
 Transition of Care [facility] [place]) - Progression Note    Patient Details  Name: Luke Velasquez MRN: [id number] Date of Birth: 10-19-2000  [facility]) CM/SW Contact  Nolita Kutter [name] Moose, [place] Phone Number: 05/14/2024, 3:31 PM  Clinical Narrative:    Pt does not have any SNF bed offers,m pt mom will not allow pt to return home. CSW has attempted to contact pt mother and left voicemails but she does not answer or return CSW phone calls. TOC leadership is aware. CSW will continue to follow.   Expected Discharge Plan: Skilled Nursing Facility Barriers to Discharge: Continued Medical Work up, English As A Second Language Teacher, SNF Pending bed offer               Expected Discharge Plan and Services In-house Referral: Clinical Social Work   Post Acute Care Choice: Skilled Nursing Facility Living arrangements for the past 2 months: Single Family Home                                       Social Drivers of Health (SDOH) Interventions SDOH Screenings   Food Insecurity: No Food Insecurity (12/15/2023)  Recent Concern: Food Insecurity - Food Insecurity Present (12/01/2023)  Housing: Low Risk  (12/15/2023)  Transportation Needs: No Transportation Needs (12/15/2023)  Utilities: Not At Risk (12/15/2023)  Depression (PHQ2-9): Low Risk  (06/27/2020)  Recent Concern: Depression (PHQ2-9) - Medium Risk (05/16/2020)  Tobacco Use: High Risk (12/13/2023)    Readmission Risk Interventions    12/14/2023    2:24 PM  Readmission Risk Prevention Plan  Transportation Screening Complete  Medication Review (RN Care Manager) Complete  PCP or Specialist appointment within 3-5 days of discharge Complete  HRI or Home Care Consult Complete  SW Recovery Care/Counseling Consult Complete  Palliative Care Screening Not Applicable  Skilled Nursing Facility Complete

## 2024-05-14 NOTE — Plan of Care (Signed)
  Problem: Coping: Goal: Ability to identify and develop effective coping behavior will improve 05/14/2024 1639 by Robynn Avelina LABOR, RN Outcome: Progressing 05/14/2024 1639 by Robynn Avelina LABOR, RN Outcome: Progressing   Problem: Clinical Measurements: Goal: Quality of life will improve 05/14/2024 1639 by Robynn Avelina LABOR, RN Outcome: Progressing 05/14/2024 1639 by Robynn Avelina LABOR, RN Outcome: Progressing   Problem: Respiratory: Goal: Verbalizations of increased ease of respirations will increase 05/14/2024 1639 by Robynn Avelina LABOR, RN Outcome: Progressing 05/14/2024 1639 by Robynn Avelina LABOR, RN Outcome: Progressing   Problem: Role Relationship: Goal: Family's ability to cope with current situation will improve 05/14/2024 1639 by Robynn Avelina LABOR, RN Outcome: Progressing 05/14/2024 1639 by Robynn Avelina LABOR, RN Outcome: Progressing Goal: Ability to verbalize concerns, feelings, and thoughts to partner or family member will improve 05/14/2024 1639 by Robynn Avelina LABOR, RN Outcome: Progressing 05/14/2024 1639 by Robynn Avelina LABOR, RN Outcome: Progressing

## 2024-05-15 DIAGNOSIS — D649 Anemia, unspecified: Secondary | ICD-10-CM | POA: Diagnosis not present
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
## 2024-05-16 DIAGNOSIS — D649 Anemia, unspecified: Secondary | ICD-10-CM | POA: Diagnosis not present

## 2024-05-16 NOTE — TOC Progression Note (Signed)
 Transition of Care [facility]) - Progression Note    Patient Details  Name: Luke Velasquez MRN: [id number] Date of Birth: 2000/10/15  [facility] CM/SW Contact  Sorcha Rotunno [name] Moose, [place] Phone Number: 05/16/2024, 8:31 AM  Clinical Narrative:    Pt does not currently have any SNF offers. CSW has attempted to call pt mom but receives no answer. TOC leadership is aware of situation. CSW will continue to follow.   Expected Discharge Plan: Skilled Nursing Facility Barriers to Discharge: Continued Medical Work up, English As A Second Language Teacher, SNF Pending bed offer               Expected Discharge Plan and Services In-house Referral: Clinical Social Work   Post Acute Care Choice: Skilled Nursing Facility Living arrangements for the past 2 months: Single Family Home                                       Social Drivers of Health (SDOH) Interventions SDOH Screenings   Food Insecurity: No Food Insecurity (12/15/2023)  Recent Concern: Food Insecurity - Food Insecurity Present (12/01/2023)  Housing: Low Risk  (12/15/2023)  Transportation Needs: No Transportation Needs (12/15/2023)  Utilities: Not At Risk (12/15/2023)  Depression (PHQ2-9): Low Risk  (06/27/2020)  Recent Concern: Depression (PHQ2-9) - Medium Risk (05/16/2020)  Tobacco Use: High Risk (12/13/2023)    Readmission Risk Interventions    12/14/2023    2:24 PM  Readmission Risk Prevention Plan  Transportation Screening Complete  Medication Review (RN Care Manager) Complete  PCP or Specialist appointment within 3-5 days of discharge Complete  HRI or Home Care Consult Complete  SW Recovery Care/Counseling Consult Complete  Palliative Care Screening Not Applicable  Skilled Nursing Facility Complete

## 2024-05-16 NOTE — Plan of Care (Signed)
  Problem: Coping: Goal: Ability to identify and develop effective coping behavior will improve Outcome: Not Progressing   Problem: Clinical Measurements: Goal: Quality of life will improve Outcome: Not Progressing   Problem: Respiratory: Goal: Verbalizations of increased ease of respirations will increase Outcome: Not Progressing   Problem: Role Relationship: Goal: Family's ability to cope with current situation will improve Outcome: Not Progressing Goal: Ability to verbalize concerns, feelings, and thoughts to partner or family member will improve Outcome: Not Progressing   Problem: Role Relationship: Goal: Ability to verbalize concerns, feelings, and thoughts to partner or family member will improve Outcome: Not Progressing
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
## 2024-05-16 NOTE — Progress Notes (Signed)
 Pt in room eating. Mother present patient was shaking stated he was cold. Took v/s Pt's temp was 99.3 informed Pt he had a low grade fever offered tylenol  Pt refused. Also took nightime medication Pt refused medication.

## 2024-05-16 NOTE — Plan of Care (Signed)
  Problem: Coping: Goal: Ability to identify and develop effective coping behavior will improve Outcome: Not Progressing   Problem: Clinical Measurements: Goal: Quality of life will improve Outcome: Not Met (add Reason) Note: Pt continues to refuse medication and care for his wounds and colostomy

## 2024-05-17 DIAGNOSIS — D649 Anemia, unspecified: Secondary | ICD-10-CM | POA: Diagnosis not present

## 2024-05-17 NOTE — Plan of Care (Signed)
 Patient refused vitals, meds and dressing change. Also asked if he wanted to go outside, patient refused. Problem: Coping: Goal: Ability to identify and develop effective coping behavior will improve Outcome: Progressing   Problem: Clinical Measurements: Goal: Quality of life will improve Outcome: Progressing
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
## 2024-05-17 NOTE — Plan of Care (Signed)
  Problem: Coping: Goal: Ability to identify and develop effective coping behavior will improve Outcome: Progressing   

## 2024-05-18 DIAGNOSIS — D649 Anemia, unspecified: Secondary | ICD-10-CM | POA: Diagnosis not present
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
## 2024-05-18 NOTE — Plan of Care (Signed)
 Patient refused foley care, assessment and meds. Colostomy bag changed. Needs attended.  Problem: Coping: Goal: Ability to identify and develop effective coping behavior will improve Outcome: Progressing   Problem: Clinical Measurements: Goal: Quality of life will improve Outcome: Progressing   Problem: Respiratory: Goal: Verbalizations of increased ease of respirations will increase Outcome: Progressing   Problem: Role Relationship: Goal: Family's ability to cope with current situation will improve Outcome: Progressing Goal: Ability to verbalize concerns, feelings, and thoughts to partner or family member will improve Outcome: Progressing

## 2024-05-18 NOTE — Progress Notes (Signed)
 Went in and pt did allow me to give him a almost full bath, changed ostomy bag, bilateral heel foams after cleansing bilateral feet and then applied new socks. Backrub given and torso washed. Tried to laugh with pt about past Halloween experiences and Trick or Treating.

## 2024-05-18 NOTE — Plan of Care (Signed)
 Patient refused assessment, medications, foley and peri care, and wound care. Problem: Coping: Goal: Ability to identify and develop effective coping behavior will improve Outcome: Progressing   Problem: Clinical Measurements: Goal: Quality of life will improve Outcome: Progressing

## 2024-05-19 NOTE — Plan of Care (Signed)
  Problem: Respiratory: Goal: Verbalizations of increased ease of respirations will increase Outcome: Progressing   

## 2024-05-19 NOTE — Plan of Care (Signed)
 Pt refused vitals, dressing change, foley care and assessment. Problem: Coping: Goal: Ability to identify and develop effective coping behavior will improve Outcome: Progressing   Problem: Clinical Measurements: Goal: Quality of life will improve Outcome: Progressing   Problem: Respiratory: Goal: Verbalizations of increased ease of respirations will increase Outcome: Progressing

## 2024-05-19 NOTE — Progress Notes (Signed)
 Patient refuse vital signs and dressing change.

## 2024-05-19 NOTE — Progress Notes (Signed)
 This is a no charge note.  Patient seen and examined, no clinical change, no new nursing concerns.

## 2024-05-20 DIAGNOSIS — D649 Anemia, unspecified: Secondary | ICD-10-CM | POA: Diagnosis not present
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
## 2024-05-20 NOTE — Plan of Care (Signed)
  Problem: Coping: Goal: Ability to identify and develop effective coping behavior will improve Outcome: Progressing   

## 2024-05-20 NOTE — TOC Progression Note (Signed)
 Transition of Care [facility]) - Progression Note    Patient Details  Name: Tony Granquist MRN: [id number] Date of Birth: Dec 10, 2000  [facility] CM/SW Contact  Isaiah Public, LCSWA Phone Number: 05/20/2024, 3:05 PM  Clinical Narrative:     No current SNF bed offers. TOC continues to follow.  Expected Discharge Plan: Skilled Nursing Facility Barriers to Discharge: Continued Medical Work up, English As A Second Language Teacher, SNF Pending bed offer               Expected Discharge Plan and Services In-house Referral: Clinical Social Work   Post Acute Care Choice: Skilled Nursing Facility Living arrangements for the past 2 months: Single Family Home                                       Social Drivers of Health (SDOH) Interventions SDOH Screenings   Food Insecurity: No Food Insecurity (12/15/2023)  Recent Concern: Food Insecurity - Food Insecurity Present (12/01/2023)  Housing: Low Risk  (12/15/2023)  Transportation Needs: No Transportation Needs (12/15/2023)  Utilities: Not At Risk (12/15/2023)  Depression (PHQ2-9): Low Risk  (06/27/2020)  Recent Concern: Depression (PHQ2-9) - Medium Risk (05/16/2020)  Tobacco Use: High Risk (12/13/2023)    Readmission Risk Interventions    12/14/2023    2:24 PM  Readmission Risk Prevention Plan  Transportation Screening Complete  Medication Review (RN Care Manager) Complete  PCP or Specialist appointment within 3-5 days of discharge Complete  HRI or Home Care Consult Complete  SW Recovery Care/Counseling Consult Complete  Palliative Care Screening Not Applicable  Skilled Nursing Facility Complete

## 2024-05-21 DIAGNOSIS — D649 Anemia, unspecified: Secondary | ICD-10-CM | POA: Diagnosis not present
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
## 2024-05-21 NOTE — Plan of Care (Signed)
  Problem: Coping: Goal: Ability to identify and develop effective coping behavior will improve Outcome: Progressing   

## 2024-05-21 NOTE — Progress Notes (Signed)
 Patient refsed Medications from RN as well as his peri care and his CHG bath from the NT as well.

## 2024-05-21 NOTE — Progress Notes (Signed)
 Patient refused Dressing change and stated that he was fine he is laying in bed with food and is comfortable no needs at the moment. Patient has been sleep on and off all day has not wanted to be bothered

## 2024-05-22 DIAGNOSIS — D649 Anemia, unspecified: Secondary | ICD-10-CM | POA: Diagnosis not present
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
## 2024-05-22 NOTE — TOC Progression Note (Signed)
 Transition of Care [facility]) - Progression Note    Patient Details  Name: Luke Velasquez MRN: [id number] Date of Birth: 03/22/2001  [facility] CM/SW Contact  Donasia Wimes [name] [place], [place] Phone Number: 05/22/2024, 9:18 AM  Clinical Narrative:    CSW is unable to reach pt mom over the phone. Pt currently has no SNF bed offers. TOC leadership is aware of situation. CSW will continue to follow.   Expected Discharge Plan: Skilled Nursing Facility Barriers to Discharge: Continued Medical Work up, English As A Second Language Teacher, SNF Pending bed offer               Expected Discharge Plan and Services In-house Referral: Clinical Social Work   Post Acute Care Choice: Skilled Nursing Facility Living arrangements for the past 2 months: Single Family Home                                       Social Drivers of Health (SDOH) Interventions SDOH Screenings   Food Insecurity: No Food Insecurity (12/15/2023)  Recent Concern: Food Insecurity - Food Insecurity Present (12/01/2023)  Housing: Low Risk  (12/15/2023)  Transportation Needs: No Transportation Needs (12/15/2023)  Utilities: Not At Risk (12/15/2023)  Depression (PHQ2-9): Low Risk  (06/27/2020)  Recent Concern: Depression (PHQ2-9) - Medium Risk (05/16/2020)  Tobacco Use: High Risk (12/13/2023)    Readmission Risk Interventions    12/14/2023    2:24 PM  Readmission Risk Prevention Plan  Transportation Screening Complete  Medication Review (RN Care Manager) Complete  PCP or Specialist appointment within 3-5 days of discharge Complete  HRI or Home Care Consult Complete  SW Recovery Care/Counseling Consult Complete  Palliative Care Screening Not Applicable  Skilled Nursing Facility Complete

## 2024-05-23 DIAGNOSIS — D649 Anemia, unspecified: Secondary | ICD-10-CM | POA: Diagnosis not present

## 2024-05-23 NOTE — Plan of Care (Signed)
  Problem: Coping: Goal: Ability to identify and develop effective coping behavior will improve Outcome: Progressing   Problem: Clinical Measurements: Goal: Quality of life will improve Outcome: Progressing   Problem: Respiratory: Goal: Verbalizations of increased ease of respirations will increase Outcome: Progressing   Problem: Role Relationship: Goal: Family's ability to cope with current situation will improve Outcome: Progressing Goal: Ability to verbalize concerns, feelings, and thoughts to partner or family member will improve Outcome: Progressing

## 2024-05-23 NOTE — Plan of Care (Signed)
  Problem: Coping: Goal: Ability to identify and develop effective coping behavior will improve 05/23/2024 1815 by Jearlean Morene FERNS, RN Outcome: Progressing 05/23/2024 1812 by Jearlean Morene I, RN Outcome: Progressing   Problem: Clinical Measurements: Goal: Quality of life will improve 05/23/2024 1815 by Jearlean Morene FERNS, RN Outcome: Progressing 05/23/2024 1812 by Jearlean Morene I, RN Outcome: Progressing   Problem: Respiratory: Goal: Verbalizations of increased ease of respirations will increase 05/23/2024 1815 by Jearlean Morene FERNS, RN Outcome: Progressing 05/23/2024 1812 by Jearlean Morene I, RN Outcome: Progressing   Problem: Role Relationship: Goal: Family's ability to cope with current situation will improve 05/23/2024 1815 by Jearlean Morene FERNS, RN Outcome: Progressing 05/23/2024 1812 by Jearlean Morene I, RN Outcome: Progressing Goal: Ability to verbalize concerns, feelings, and thoughts to partner or family member will improve 05/23/2024 1815 by Jearlean Morene FERNS, RN Outcome: Progressing 05/23/2024 1812 by Jearlean Morene I, RN Outcome: Progressing   Problem: Role Relationship: Goal: Family's ability to cope with current situation will improve 05/23/2024 1815 by Jearlean Morene FERNS, RN Outcome: Progressing 05/23/2024 1812 by Jearlean Morene I, RN Outcome: Progressing Goal: Ability to verbalize concerns, feelings, and thoughts to partner or family member will improve 05/23/2024 1815 by Jearlean Morene FERNS, RN Outcome: Progressing 05/23/2024 1812 by Jearlean Morene FERNS, RN Outcome: Progressing
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
## 2024-05-24 DIAGNOSIS — D649 Anemia, unspecified: Secondary | ICD-10-CM | POA: Diagnosis not present
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
## 2024-05-24 NOTE — Progress Notes (Signed)
 Checked in with pt and we made a plan for me to do his bath and dressing changes this afternoon.

## 2024-05-24 NOTE — Progress Notes (Signed)
   05/24/24 9378  Vital Signs  Temp  (pt refused all vitial signs and All care RN Leonie was notified.)

## 2024-05-24 NOTE — Plan of Care (Signed)
 Pt has been in the bed most of the day, not up in his wheelchair.

## 2024-05-24 NOTE — Plan of Care (Signed)
 Pt continues to refuse vital signs monitoring, meds and wound dressing changes. Pt not very interactive, just answer with nod for yes and shakes head for no.  Problem: Coping: Goal: Ability to identify and develop effective coping behavior will improve Outcome: Not Progressing   Problem: Clinical Measurements: Goal: Quality of life will improve Outcome: Not Progressing   Problem: Respiratory: Goal: Verbalizations of increased ease of respirations will increase Outcome: Not Progressing   Problem: Role Relationship: Goal: Family's ability to cope with current situation will improve Outcome: Not Progressing Goal: Ability to verbalize concerns, feelings, and thoughts to partner or family member will improve Outcome: Not Progressing

## 2024-05-25 DIAGNOSIS — D649 Anemia, unspecified: Secondary | ICD-10-CM | POA: Diagnosis not present
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
## 2024-05-25 NOTE — Plan of Care (Signed)
 Pt non compliant to treatment plan. Has refused vital signs checks, foley care, night meds and wound care tonight.  Problem: Coping: Goal: Ability to identify and develop effective coping behavior will improve Outcome: Not Progressing   Problem: Clinical Measurements: Goal: Quality of life will improve Outcome: Not Progressing   Problem: Respiratory: Goal: Verbalizations of increased ease of respirations will increase Outcome: Not Progressing   Problem: Role Relationship: Goal: Family's ability to cope with current situation will improve Outcome: Not Progressing Goal: Ability to verbalize concerns, feelings, and thoughts to partner or family member will improve Outcome: Not Progressing

## 2024-05-26 DIAGNOSIS — D649 Anemia, unspecified: Secondary | ICD-10-CM | POA: Diagnosis not present
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
## 2024-05-27 DIAGNOSIS — D649 Anemia, unspecified: Secondary | ICD-10-CM | POA: Diagnosis not present

## 2024-05-27 NOTE — Progress Notes (Signed)
 Pt continues to refuse medications, vitals and change of dressings. MD aware.

## 2024-05-27 NOTE — Progress Notes (Signed)
 Pt allow nurse to perform catheter care and burp his colostomy bag. Pt is agreeable to get pants change later today.

## 2024-05-27 NOTE — Plan of Care (Signed)
  Problem: Coping: Goal: Ability to identify and develop effective coping behavior will improve Outcome: Progressing   Problem: Clinical Measurements: Goal: Quality of life will improve Outcome: Progressing
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
## 2024-05-28 DIAGNOSIS — D649 Anemia, unspecified: Secondary | ICD-10-CM | POA: Diagnosis not present

## 2024-05-28 NOTE — Progress Notes (Signed)
 Refused medications, dressings and vital signs taken.

## 2024-05-28 NOTE — Plan of Care (Signed)
 Patient still refused his assessment, wound care and education. Problem: Coping: Goal: Ability to identify and develop effective coping behavior will improve Outcome: Progressing   Problem: Clinical Measurements: Goal: Quality of life will improve Outcome: Progressing   Problem: Respiratory: Goal: Verbalizations of increased ease of respirations will increase Outcome: Progressing   Problem: Role Relationship: Goal: Family's ability to cope with current situation will improve Outcome: Progressing Goal: Ability to verbalize concerns, feelings, and thoughts to partner or family member will improve Outcome: Progressing
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
## 2024-05-29 DIAGNOSIS — Z86718 Personal history of other venous thrombosis and embolism: Secondary | ICD-10-CM

## 2024-05-29 DIAGNOSIS — N319 Neuromuscular dysfunction of bladder, unspecified: Secondary | ICD-10-CM

## 2024-05-29 DIAGNOSIS — E43 Unspecified severe protein-calorie malnutrition: Secondary | ICD-10-CM

## 2024-05-29 DIAGNOSIS — D649 Anemia, unspecified: Secondary | ICD-10-CM | POA: Diagnosis not present

## 2024-05-29 DIAGNOSIS — Z933 Colostomy status: Secondary | ICD-10-CM

## 2024-05-29 DIAGNOSIS — L89154 Pressure ulcer of sacral region, stage 4: Secondary | ICD-10-CM

## 2024-05-29 DIAGNOSIS — Z993 Dependence on wheelchair: Secondary | ICD-10-CM
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
## 2024-05-29 NOTE — Plan of Care (Signed)
  Problem: Coping: Goal: Ability to identify and develop effective coping behavior will improve Outcome: Progressing   

## 2024-05-29 NOTE — Plan of Care (Signed)
  Problem: Coping: Goal: Ability to identify and develop effective coping behavior will improve Outcome: Progressing   Problem: Clinical Measurements: Goal: Quality of life will improve Outcome: Progressing   Problem: Respiratory: Goal: Verbalizations of increased ease of respirations will increase Outcome: Progressing   Problem: Role Relationship: Goal: Family's ability to cope with current situation will improve Outcome: Progressing Goal: Ability to verbalize concerns, feelings, and thoughts to partner or family member will improve Outcome: Progressing

## 2024-05-30 DIAGNOSIS — Z933 Colostomy status: Secondary | ICD-10-CM | POA: Diagnosis not present

## 2024-05-30 DIAGNOSIS — L89154 Pressure ulcer of sacral region, stage 4: Secondary | ICD-10-CM | POA: Diagnosis not present

## 2024-05-30 DIAGNOSIS — Z86718 Personal history of other venous thrombosis and embolism: Secondary | ICD-10-CM | POA: Diagnosis not present

## 2024-05-30 DIAGNOSIS — D649 Anemia, unspecified: Secondary | ICD-10-CM | POA: Diagnosis not present
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
## 2024-05-30 NOTE — Progress Notes (Signed)
 Patient has refused all medications, wound care and assessments for today. He did not refuse nutrition and did request sodas multiple times.  Verdie JONETTA Collier, RN

## 2024-05-30 NOTE — Plan of Care (Signed)
  Problem: Coping: Goal: Ability to identify and develop effective coping behavior will improve Outcome: Progressing   

## 2024-05-30 NOTE — Progress Notes (Signed)
 Patient refused shift assessment and all medications. He did state that he was in no pain and denies any needs at this time.

## 2024-05-31 DIAGNOSIS — L89154 Pressure ulcer of sacral region, stage 4: Secondary | ICD-10-CM | POA: Diagnosis not present

## 2024-05-31 DIAGNOSIS — D649 Anemia, unspecified: Secondary | ICD-10-CM | POA: Diagnosis not present

## 2024-05-31 DIAGNOSIS — Z86718 Personal history of other venous thrombosis and embolism: Secondary | ICD-10-CM | POA: Diagnosis not present

## 2024-05-31 DIAGNOSIS — Z933 Colostomy status: Secondary | ICD-10-CM | POA: Diagnosis not present
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
## 2024-05-31 NOTE — Plan of Care (Signed)
  Problem: Coping: Goal: Ability to identify and develop effective coping behavior will improve Outcome: Not Progressing   Problem: Clinical Measurements: Goal: Quality of life will improve Outcome: Not Progressing   Problem: Role Relationship: Goal: Ability to verbalize concerns, feelings, and thoughts to partner or family member will improve Outcome: Not Progressing

## 2024-05-31 NOTE — Progress Notes (Signed)
 Patient refused assessment, medications and wound care for today.  Verdie JONETTA Collier, RN

## 2024-06-01 DIAGNOSIS — L89154 Pressure ulcer of sacral region, stage 4: Secondary | ICD-10-CM | POA: Diagnosis not present

## 2024-06-01 DIAGNOSIS — D649 Anemia, unspecified: Secondary | ICD-10-CM | POA: Diagnosis not present

## 2024-06-01 DIAGNOSIS — Z933 Colostomy status: Secondary | ICD-10-CM | POA: Diagnosis not present

## 2024-06-01 DIAGNOSIS — Z86718 Personal history of other venous thrombosis and embolism: Secondary | ICD-10-CM | POA: Diagnosis not present

## 2024-06-01 NOTE — Progress Notes (Signed)
 Limited assessment done as patient refuses full assessment, also refused all his night medications.
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
## 2024-06-01 NOTE — Progress Notes (Signed)
 Patient refused assessment . Able to see and talk with the patient but wont allow staff to check on him.

## 2024-06-02 DIAGNOSIS — Z933 Colostomy status: Secondary | ICD-10-CM | POA: Diagnosis not present

## 2024-06-02 DIAGNOSIS — Z86718 Personal history of other venous thrombosis and embolism: Secondary | ICD-10-CM | POA: Diagnosis not present

## 2024-06-02 DIAGNOSIS — D649 Anemia, unspecified: Secondary | ICD-10-CM | POA: Diagnosis not present

## 2024-06-02 DIAGNOSIS — L89154 Pressure ulcer of sacral region, stage 4: Secondary | ICD-10-CM | POA: Diagnosis not present
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
## 2024-06-02 NOTE — Plan of Care (Signed)
  Problem: Coping: Goal: Ability to identify and develop effective coping behavior will improve Outcome: Not Progressing   Problem: Clinical Measurements: Goal: Quality of life will improve Outcome: Not Progressing   Problem: Role Relationship: Goal: Family's ability to cope with current situation will improve Outcome: Not Progressing Goal: Ability to verbalize concerns, feelings, and thoughts to partner or family member will improve Outcome: Not Progressing

## 2024-06-02 NOTE — Progress Notes (Signed)
 Patient allowed me to do a partial assessment but refused dressing changes

## 2024-06-02 NOTE — Plan of Care (Signed)
  Problem: Respiratory: Goal: Verbalizations of increased ease of respirations will increase Outcome: Not Progressing   Problem: Clinical Measurements: Goal: Quality of life will improve Outcome: Not Progressing

## 2024-06-02 NOTE — Progress Notes (Signed)
 Patient refused morning medications and to have an assessment performed at this time.

## 2024-06-03 DIAGNOSIS — N319 Neuromuscular dysfunction of bladder, unspecified: Secondary | ICD-10-CM | POA: Diagnosis not present

## 2024-06-03 DIAGNOSIS — Z933 Colostomy status: Secondary | ICD-10-CM | POA: Diagnosis not present

## 2024-06-03 DIAGNOSIS — F3161 Bipolar disorder, current episode mixed, mild: Secondary | ICD-10-CM

## 2024-06-03 DIAGNOSIS — D649 Anemia, unspecified: Secondary | ICD-10-CM | POA: Diagnosis not present

## 2024-06-03 DIAGNOSIS — N390 Urinary tract infection, site not specified: Secondary | ICD-10-CM

## 2024-06-03 DIAGNOSIS — A419 Sepsis, unspecified organism: Secondary | ICD-10-CM | POA: Diagnosis not present

## 2024-06-03 MED ORDER — CHLORHEXIDINE GLUCONATE CLOTH 2 % EX PADS
6.0000 | MEDICATED_PAD | Freq: Every day | CUTANEOUS | Status: DC
  Administered 2024-06-03 – 2024-06-14 (×3): 6 via TOPICAL
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
## 2024-06-03 NOTE — Plan of Care (Signed)
  Problem: Coping: Goal: Ability to identify and develop effective coping behavior will improve Outcome: Not Progressing   Problem: Clinical Measurements: Goal: Quality of life will improve Outcome: Not Progressing   Problem: Role Relationship: Goal: Family's ability to cope with current situation will improve Outcome: Not Progressing Goal: Ability to verbalize concerns, feelings, and thoughts to partner or family member will improve Outcome: Not Progressing

## 2024-06-04 DIAGNOSIS — L89154 Pressure ulcer of sacral region, stage 4: Secondary | ICD-10-CM | POA: Diagnosis not present

## 2024-06-04 DIAGNOSIS — D649 Anemia, unspecified: Secondary | ICD-10-CM | POA: Diagnosis not present

## 2024-06-04 DIAGNOSIS — Z86718 Personal history of other venous thrombosis and embolism: Secondary | ICD-10-CM | POA: Diagnosis not present

## 2024-06-04 DIAGNOSIS — A419 Sepsis, unspecified organism: Secondary | ICD-10-CM | POA: Diagnosis not present
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
## 2024-06-04 NOTE — Progress Notes (Signed)
 Gave bath to pt and changed pants and linens, changed heel protectors.

## 2024-06-04 NOTE — Plan of Care (Signed)
 Disposition of pt in question, reported that mother came to visit this weekend.

## 2024-06-05 DIAGNOSIS — A419 Sepsis, unspecified organism: Secondary | ICD-10-CM | POA: Diagnosis not present

## 2024-06-05 DIAGNOSIS — L89154 Pressure ulcer of sacral region, stage 4: Secondary | ICD-10-CM | POA: Diagnosis not present

## 2024-06-05 DIAGNOSIS — D649 Anemia, unspecified: Secondary | ICD-10-CM | POA: Diagnosis not present

## 2024-06-05 DIAGNOSIS — Z86718 Personal history of other venous thrombosis and embolism: Secondary | ICD-10-CM | POA: Diagnosis not present

## 2024-06-05 NOTE — Plan of Care (Signed)
  Problem: Role Relationship: Goal: Family's ability to cope with current situation will improve Outcome: Progressing   Problem: Coping: Goal: Ability to identify and develop effective coping behavior will improve Outcome: Not Progressing   Problem: Clinical Measurements: Goal: Quality of life will improve Outcome: Not Progressing   Problem: Role Relationship: Goal: Ability to verbalize concerns, feelings, and thoughts to partner or family member will improve Outcome: Not Progressing
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
## 2024-06-06 DIAGNOSIS — Z86718 Personal history of other venous thrombosis and embolism: Secondary | ICD-10-CM | POA: Diagnosis not present

## 2024-06-06 DIAGNOSIS — A419 Sepsis, unspecified organism: Secondary | ICD-10-CM | POA: Diagnosis not present

## 2024-06-06 DIAGNOSIS — L89154 Pressure ulcer of sacral region, stage 4: Secondary | ICD-10-CM | POA: Diagnosis not present

## 2024-06-06 DIAGNOSIS — D649 Anemia, unspecified: Secondary | ICD-10-CM | POA: Diagnosis not present
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
## 2024-06-06 NOTE — Plan of Care (Signed)
  Problem: Clinical Measurements: Goal: Quality of life will improve Outcome: Not Progressing   Problem: Role Relationship: Goal: Family's ability to cope with current situation will improve Outcome: Not Progressing Goal: Ability to verbalize concerns, feelings, and thoughts to partner or family member will improve Outcome: Not Progressing

## 2024-06-07 DIAGNOSIS — A419 Sepsis, unspecified organism: Secondary | ICD-10-CM | POA: Diagnosis not present

## 2024-06-07 DIAGNOSIS — Z86718 Personal history of other venous thrombosis and embolism: Secondary | ICD-10-CM | POA: Diagnosis not present

## 2024-06-07 DIAGNOSIS — D649 Anemia, unspecified: Secondary | ICD-10-CM | POA: Diagnosis not present

## 2024-06-07 DIAGNOSIS — L89154 Pressure ulcer of sacral region, stage 4: Secondary | ICD-10-CM | POA: Diagnosis not present

## 2024-06-07 NOTE — Plan of Care (Signed)
 Pt refused VS, meds, foley care and dressing change.  Problem: Coping: Goal: Ability to identify and develop effective coping behavior will improve Outcome: Not Progressing   Problem: Clinical Measurements: Goal: Quality of life will improve Outcome: Not Progressing   Problem: Respiratory: Goal: Verbalizations of increased ease of respirations will increase Outcome: Not Progressing   Problem: Role Relationship: Goal: Family's ability to cope with current situation will improve Outcome: Not Progressing Goal: Ability to verbalize concerns, feelings, and thoughts to partner or family member will improve Outcome: Not Progressing

## 2024-06-07 NOTE — Plan of Care (Signed)
  Problem: Coping: Goal: Ability to identify and develop effective coping behavior will improve Outcome: Progressing   Problem: Clinical Measurements: Goal: Quality of life will improve Outcome: Progressing   Problem: Respiratory: Goal: Verbalizations of increased ease of respirations will increase Outcome: Progressing   Problem: Role Relationship: Goal: Family's ability to cope with current situation will improve Outcome: Progressing Goal: Ability to verbalize concerns, feelings, and thoughts to partner or family member will improve Outcome: Progressing
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
## 2024-06-07 NOTE — TOC Progression Note (Signed)
 Transition of Care [facility]) - Progression Note    Patient Details  Name: Luke Velasquez MRN: [id number] Date of Birth: Jul 06, 2001  Transition of Care [facility]) CM/SW Contact  Dream Harman [name] Moose, [place] Phone Number: 06/07/2024, 10:15 AM  Clinical Narrative:    Pt does not have any SNF bed offers. CSW unable to reach pt mom over the phone. TOC leadership aware of situation. CSW will continue to follow.   Expected Discharge Plan: Skilled Nursing Facility Barriers to Discharge: Continued Medical Work up, English As A Second Language Teacher, SNF Pending bed offer               Expected Discharge Plan and Services In-house Referral: Clinical Social Work   Post Acute Care Choice: Skilled Nursing Facility Living arrangements for the past 2 months: Single Family Home                                       Social Drivers of Health (SDOH) Interventions SDOH Screenings   Food Insecurity: No Food Insecurity (12/15/2023)  Recent Concern: Food Insecurity - Food Insecurity Present (12/01/2023)  Housing: Low Risk  (12/15/2023)  Transportation Needs: No Transportation Needs (12/15/2023)  Utilities: Not At Risk (12/15/2023)  Depression (PHQ2-9): Low Risk  (06/27/2020)  Recent Concern: Depression (PHQ2-9) - Medium Risk (05/16/2020)  Tobacco Use: High Risk (12/13/2023)    Readmission Risk Interventions    12/14/2023    2:24 PM  Readmission Risk Prevention Plan  Transportation Screening Complete  Medication Review (RN Care Manager) Complete  PCP or Specialist appointment within 3-5 days of discharge Complete  HRI or Home Care Consult Complete  SW Recovery Care/Counseling Consult Complete  Palliative Care Screening Not Applicable  Skilled Nursing Facility Complete

## 2024-06-08 DIAGNOSIS — D649 Anemia, unspecified: Secondary | ICD-10-CM | POA: Diagnosis not present

## 2024-06-08 DIAGNOSIS — A419 Sepsis, unspecified organism: Secondary | ICD-10-CM | POA: Diagnosis not present

## 2024-06-08 DIAGNOSIS — Z86718 Personal history of other venous thrombosis and embolism: Secondary | ICD-10-CM | POA: Diagnosis not present

## 2024-06-08 DIAGNOSIS — L89154 Pressure ulcer of sacral region, stage 4: Secondary | ICD-10-CM | POA: Diagnosis not present

## 2024-06-08 NOTE — Progress Notes (Signed)
 Pt refused lab draw, vitals and dressing changes this morning.
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
## 2024-06-09 DIAGNOSIS — Z86718 Personal history of other venous thrombosis and embolism: Secondary | ICD-10-CM | POA: Diagnosis not present

## 2024-06-09 DIAGNOSIS — A419 Sepsis, unspecified organism: Secondary | ICD-10-CM | POA: Diagnosis not present

## 2024-06-09 DIAGNOSIS — L89154 Pressure ulcer of sacral region, stage 4: Secondary | ICD-10-CM | POA: Diagnosis not present

## 2024-06-09 DIAGNOSIS — D649 Anemia, unspecified: Secondary | ICD-10-CM | POA: Diagnosis not present

## 2024-06-09 NOTE — Plan of Care (Signed)
 Pt spent much of the day up in the wheelchair today. I was able to assist him with a top bath, deodorant and change his shirt. Mom came in and brought food to pt this evening.

## 2024-06-09 NOTE — Progress Notes (Signed)
 Pt up in wheelchair at the window soaking in the sunshine.  Scrubbed the room and bed down, will come back and change his colostomy bag.
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
## 2024-06-10 DIAGNOSIS — D649 Anemia, unspecified: Secondary | ICD-10-CM | POA: Diagnosis not present
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
## 2024-06-10 NOTE — Plan of Care (Signed)
  Problem: Coping: Goal: Ability to identify and develop effective coping behavior will improve Outcome: Progressing   Problem: Clinical Measurements: Goal: Quality of life will improve Outcome: Progressing   Problem: Role Relationship: Goal: Ability to verbalize concerns, feelings, and thoughts to partner or family member will improve Outcome: Progressing

## 2024-06-10 NOTE — Progress Notes (Signed)
 Patient refused shift assessments and bedtime medications. Pt sitting up in wheelchair; denies any pain/discomfort.

## 2024-06-10 NOTE — Progress Notes (Signed)
 Pt refused dressing changes this morning.

## 2024-06-11 DIAGNOSIS — D649 Anemia, unspecified: Secondary | ICD-10-CM | POA: Diagnosis not present

## 2024-06-11 NOTE — Progress Notes (Signed)
 Pt usually allows me to clean him and room but is not today. Added Aromatherapy to room for smell and gave him warm blankets and a sprite.
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
## 2024-06-11 NOTE — Plan of Care (Signed)
  Problem: Role Relationship: Goal: Family's ability to cope with current situation will improve Outcome: Progressing   Problem: Role Relationship: Goal: Ability to verbalize concerns, feelings, and thoughts to partner or family member will improve Outcome: Progressing

## 2024-06-11 NOTE — Plan of Care (Signed)
  Problem: Coping: Goal: Ability to identify and develop effective coping behavior will improve Outcome: Progressing   Problem: Clinical Measurements: Goal: Quality of life will improve Outcome: Progressing   Problem: Respiratory: Goal: Verbalizations of increased ease of respirations will increase Outcome: Progressing   Problem: Role Relationship: Goal: Family's ability to cope with current situation will improve Outcome: Progressing Goal: Ability to verbalize concerns, feelings, and thoughts to partner or family member will improve Outcome: Progressing

## 2024-06-11 NOTE — Progress Notes (Signed)
 Pt refused care this morning and medciations. MD at bedside and notified.   MD adv pt that patient needs to let us  assess his ostomy and empty it as needed. Pt agreed.   Will cont to encourage pt.

## 2024-06-11 NOTE — Progress Notes (Signed)
 Pt continues to refuse care despite education. He has refused mobility and meds this afternoon.   Will continue to encourage and educate pt.

## 2024-06-12 DIAGNOSIS — D649 Anemia, unspecified: Secondary | ICD-10-CM | POA: Diagnosis not present

## 2024-06-12 NOTE — TOC Progression Note (Signed)
 Transition of Care [facility]) - Progression Note    Patient Details  Name: [name] MRN: [id number] Date of Birth: 12/27/2000  [facility]) CM/SW Contact  Keyshaun Exley [name] [place], [place] Phone Number: 06/12/2024, 3:02 PM  Clinical Narrative:    Pt does not have [place] SNF bed offers. TOC leadership is aware of situaton. CSW unable to reach pt mother by phone. CSW will continue to follow.   Expected Discharge Plan: Skilled Nursing Facility Barriers to Discharge: Continued Medical Work up, English As A Second Language Teacher, SNF Pending bed offer               Expected Discharge Plan and Services In-house Referral: Clinical Social Work   Post Acute Care Choice: Skilled Nursing Facility Living arrangements for the past 2 months: Single Family Home                                       Social Drivers of Health (SDOH) Interventions SDOH Screenings   Food Insecurity: No Food Insecurity (12/15/2023)  Recent Concern: Food Insecurity - Food Insecurity Present (12/01/2023)  Housing: Low Risk  (12/15/2023)  Transportation Needs: No Transportation Needs (12/15/2023)  Utilities: Not At Risk (12/15/2023)  Depression (PHQ2-9): Low Risk  (06/27/2020)  Recent Concern: Depression (PHQ2-9) - Medium Risk (05/16/2020)  Tobacco Use: High Risk (12/13/2023)    Readmission Risk Interventions    12/14/2023    2:24 PM  Readmission Risk Prevention Plan  Transportation Screening Complete  Medication Review (RN Care Manager) Complete  PCP or Specialist appointment within 3-5 days of discharge Complete  HRI or Home Care Consult Complete  SW Recovery Care/Counseling Consult Complete  Palliative Care Screening Not Applicable  Skilled Nursing Facility Complete

## 2024-06-12 NOTE — Plan of Care (Addendum)
 Patient refuses wound care.  Problem: Coping: Goal: Ability to identify and develop effective coping behavior will improve Outcome: Progressing   Problem: Clinical Measurements: Goal: Quality of life will improve Outcome: Progressing   Problem: Respiratory: Goal: Verbalizations of increased ease of respirations will increase Outcome: Progressing   Problem: Role Relationship: Goal: Family's ability to cope with current situation will improve Outcome: Progressing Goal: Ability to verbalize concerns, feelings, and thoughts to partner or family member will improve Outcome: Progressing

## 2024-06-12 NOTE — Plan of Care (Signed)
  Problem: Coping: Goal: Ability to identify and develop effective coping behavior will improve Outcome: Progressing   
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
## 2024-06-13 DIAGNOSIS — D649 Anemia, unspecified: Secondary | ICD-10-CM | POA: Diagnosis not present

## 2024-06-13 NOTE — Plan of Care (Signed)
  Problem: Coping: Goal: Ability to identify and develop effective coping behavior will improve Outcome: Progressing   
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
## 2024-06-14 DIAGNOSIS — D649 Anemia, unspecified: Secondary | ICD-10-CM | POA: Diagnosis not present

## 2024-06-14 NOTE — Plan of Care (Signed)
  Problem: Coping: Goal: Ability to identify and develop effective coping behavior will improve Outcome: Progressing   
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
## 2024-06-15 DIAGNOSIS — D649 Anemia, unspecified: Secondary | ICD-10-CM | POA: Diagnosis not present

## 2024-06-15 MED ORDER — ONDANSETRON HCL 4 MG PO TABS
4.0000 mg | ORAL_TABLET | Freq: Three times a day (TID) | ORAL | Status: AC | PRN
  Administered 2024-06-15: 4 mg via ORAL
  Filled 2024-06-15: qty 1

## 2024-06-15 MED ORDER — ONDANSETRON HCL 4 MG/2ML IJ SOLN: 4.0000 mg | Freq: Four times a day (QID) | INTRAMUSCULAR | Status: DC | PRN

## 2024-06-15 NOTE — Progress Notes (Signed)
 Pt is running a fever and having pain in his back refusing to take acetaminophen  at this time. Notified MD pt is also vomiting and nausea. See MAR for new orders. Pt mother is present and notified nurse about pt vomiting and needed to be clean.

## 2024-06-15 NOTE — Plan of Care (Signed)
  Problem: Coping: Goal: Ability to identify and develop effective coping behavior will improve Outcome: Progressing   Problem: Role Relationship: Goal: Ability to verbalize concerns, feelings, and thoughts to partner or family member will improve Outcome: Progressing

## 2024-06-15 NOTE — Progress Notes (Signed)
 Nurse was notified pt is having pain. Offer acetaminophen  5/10 pain in his abdomen area but pt refused.

## 2024-06-15 NOTE — Plan of Care (Signed)
  Problem: Clinical Measurements: Goal: Quality of life will improve Outcome: Not Progressing   Problem: Role Relationship: Goal: Family's ability to cope with current situation will improve Outcome: Not Progressing
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
## 2024-06-16 ENCOUNTER — Inpatient Hospital Stay (HOSPITAL_COMMUNITY)

## 2024-06-16 DIAGNOSIS — D649 Anemia, unspecified: Secondary | ICD-10-CM | POA: Diagnosis not present

## 2024-06-16 NOTE — Plan of Care (Signed)
  Problem: Role Relationship: Goal: Family's ability to cope with current situation will improve Outcome: Not Progressing Goal: Ability to verbalize concerns, feelings, and thoughts to partner or family member will improve Outcome: Not Progressing   Problem: Role Relationship: Goal: Ability to verbalize concerns, feelings, and thoughts to partner or family member will improve Outcome: Not Progressing

## 2024-06-16 NOTE — Progress Notes (Signed)
 Notified MD via secure chat r/t patient distention of abdomen.  Informed patient that they will be doing a x-ray and he is okay with it. New orders place.

## 2024-06-16 NOTE — Progress Notes (Signed)
 Pt called nurse and ask to run water. Nurse than began to get patient clean up since he vomit at some point this morning. Dressing and colostomy clean and change. Foley catheter is clean. Patient requested just scrub pants on at this time and warm blankets. He did receive coke and ice as well.

## 2024-06-16 NOTE — Plan of Care (Signed)
  Problem: Coping: Goal: Ability to identify and develop effective coping behavior will improve Outcome: Progressing   Problem: Clinical Measurements: Goal: Quality of life will improve Outcome: Progressing   Problem: Respiratory: Goal: Verbalizations of increased ease of respirations will increase Outcome: Progressing   Problem: Role Relationship: Goal: Family's ability to cope with current situation will improve Outcome: Progressing Goal: Ability to verbalize concerns, feelings, and thoughts to partner or family member will improve Outcome: Progressing
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
## 2024-06-17 DIAGNOSIS — D649 Anemia, unspecified: Secondary | ICD-10-CM | POA: Diagnosis not present

## 2024-06-17 MED ORDER — OXYCODONE HCL 5 MG PO TABS
5.0000 mg | ORAL_TABLET | Freq: Four times a day (QID) | ORAL | Status: AC | PRN
  Administered 2024-06-17 – 2024-07-31 (×3): 5 mg via ORAL
  Filled 2024-06-17 (×3): qty 1
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
## 2024-06-17 NOTE — Plan of Care (Signed)
  Problem: Coping: Goal: Ability to identify and develop effective coping behavior will improve Outcome: Progressing   Problem: Clinical Measurements: Goal: Quality of life will improve Outcome: Progressing   Problem: Respiratory: Goal: Verbalizations of increased ease of respirations will increase Outcome: Progressing   Problem: Role Relationship: Goal: Family's ability to cope with current situation will improve Outcome: Progressing Goal: Ability to verbalize concerns, feelings, and thoughts to partner or family member will improve Outcome: Progressing

## 2024-06-17 NOTE — Progress Notes (Signed)
 Nurse notified MD that patient is c/o pain in his abdomen and back this morning. Pt did refused acetaminophen  states it does nothing for me. Nurse secure chat MD just inquiring if we can convince pt to get a IV and MD he will be up to round on pt in a few. Pt did request mango ice crush and ice. Pt did notified nurse that he did vomited yesterday. MD did place new orders for pain medication. Pt is agreeable to receive oxycodone  this morning.

## 2024-06-18 DIAGNOSIS — D649 Anemia, unspecified: Secondary | ICD-10-CM | POA: Diagnosis not present
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
## 2024-06-18 NOTE — Plan of Care (Signed)
  Problem: Coping: Goal: Ability to identify and develop effective coping behavior will improve Outcome: Progressing   Problem: Clinical Measurements: Goal: Quality of life will improve Outcome: Progressing   Problem: Respiratory: Goal: Verbalizations of increased ease of respirations will increase Outcome: Progressing   Problem: Role Relationship: Goal: Family's ability to cope with current situation will improve Outcome: Progressing Goal: Ability to verbalize concerns, feelings, and thoughts to partner or family member will improve Outcome: Progressing

## 2024-06-19 DIAGNOSIS — D649 Anemia, unspecified: Secondary | ICD-10-CM | POA: Diagnosis not present

## 2024-06-19 NOTE — Plan of Care (Signed)
  Problem: Coping: Goal: Ability to identify and develop effective coping behavior will improve Outcome: Progressing   Problem: Clinical Measurements: Goal: Quality of life will improve Outcome: Progressing   Problem: Respiratory: Goal: Verbalizations of increased ease of respirations will increase Outcome: Progressing   Problem: Role Relationship: Goal: Family's ability to cope with current situation will improve Outcome: Progressing Goal: Ability to verbalize concerns, feelings, and thoughts to partner or family member will improve Outcome: Progressing
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
## 2024-06-19 NOTE — Progress Notes (Signed)
 Notified Binaya Dahal in regard to patient refusing patient medications and supplements.

## 2024-06-20 NOTE — Progress Notes (Signed)
 Patient refused morning medications and wound care. Notified MD Chapman Rota.

## 2024-06-20 NOTE — Progress Notes (Signed)
 Attempted to do an assessment on patient. Would not allow me to touch him. Denies any pain or needs at this time.
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
## 2024-06-20 NOTE — Plan of Care (Signed)
  Problem: Coping: Goal: Ability to identify and develop effective coping behavior will improve Outcome: Not Progressing   Problem: Clinical Measurements: Goal: Quality of life will improve Outcome: Not Progressing   Problem: Role Relationship: Goal: Family's ability to cope with current situation will improve Outcome: Not Progressing

## 2024-06-20 NOTE — Progress Notes (Signed)
 Patient allowed this nurse to do a partial assessment but refused all meds and dressing changes
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
## 2024-06-21 NOTE — Plan of Care (Signed)
  Problem: Coping: Goal: Ability to identify and develop effective coping behavior will improve Outcome: Not Progressing   

## 2024-06-21 NOTE — Plan of Care (Signed)
 Pt refusing care.  Pt stated he needed ostomy bag changed.  This RN offered assistance and pt refused. Stated to just cut out the bag to the right side. Refused allowing me to turn light on. Told me to cut in the bathroom. I handed pt bag and once again offered assistance and pt refused. He turned and start applying it himself. At this moment I was able to see the old ostomy bag had been previously removed. The stoma was OTA and pt was lying on stomach and stool.   I gave pt towels and offered to help but pt refused and only requested more wet towels. Pt cleaned the bed as much as he was able to but bed was not disinfected or fully cleaned as pt refused this care/help.   Problem: Coping: Goal: Ability to identify and develop effective coping behavior will improve Outcome: Not Progressing

## 2024-06-21 NOTE — Progress Notes (Signed)
 Patient appears much more depressed than usual over the past 2 days. Wants to isolate himself and is refusing any meds or care of any kind. Very concerning.

## 2024-06-21 NOTE — Progress Notes (Signed)
 Patient refused all assessments, daily care, wound care, and medications

## 2024-06-22 NOTE — Plan of Care (Signed)
  Problem: Coping: Goal: Ability to identify and develop effective coping behavior will improve Outcome: Not Progressing   Problem: Role Relationship: Goal: Family's ability to cope with current situation will improve Outcome: Not Progressing Goal: Ability to verbalize concerns, feelings, and thoughts to partner or family member will improve Outcome: Not Progressing

## 2024-06-22 NOTE — Plan of Care (Signed)
  Problem: Coping: Goal: Ability to identify and develop effective coping behavior will improve Outcome: Progressing   Problem: Respiratory: Goal: Verbalizations of increased ease of respirations will increase Outcome: Progressing
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
## 2024-06-22 NOTE — Progress Notes (Signed)
Patient refused assessment  

## 2024-06-22 NOTE — Progress Notes (Signed)
 Patient refused all medications and care today.

## 2024-06-23 NOTE — Plan of Care (Signed)
  Problem: Coping: Goal: Ability to identify and develop effective coping behavior will improve Outcome: Not Progressing   
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
## 2024-06-23 NOTE — Progress Notes (Signed)
 Patient irritable and shouting. Patient refused assessment and stated, I don't want any medications, get out of my room and leave me alone. You're talking too much.

## 2024-06-24 NOTE — Progress Notes (Signed)
 Patient refuse medications and assessment.

## 2024-06-24 NOTE — Plan of Care (Signed)
  Problem: Coping: Goal: Ability to identify and develop effective coping behavior will improve Outcome: Progressing   

## 2024-06-24 NOTE — Plan of Care (Signed)
  Problem: Coping: Goal: Ability to identify and develop effective coping behavior will improve Outcome: Not Progressing   
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
## 2024-06-25 NOTE — Progress Notes (Signed)
 Urine leaking around foley, foley cath changed using sterile technique. Cloudy tan colored urine returned MD notified.Per MD No interventions. Patient is comfort care so we will just give supportive care.  Iantha Titsworth, Cena Helling, RN

## 2024-06-25 NOTE — Progress Notes (Addendum)
 Pick up patient from nurse leaving at 1500, patient refused assessment. Was reported from off going nurse patient has been refusing everything. Denies pain at this time   Laurann Mcmorris, Cena Helling, RN

## 2024-06-25 NOTE — Plan of Care (Signed)
  Problem: Coping: Goal: Ability to identify and develop effective coping behavior will improve Outcome: Progressing   Problem: Clinical Measurements: Goal: Quality of life will improve Outcome: Progressing   Problem: Role Relationship: Goal: Family's ability to cope with current situation will improve Outcome: Progressing
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
## 2024-06-26 NOTE — Progress Notes (Signed)
 Patient refused all morning medications, education on the importance provided. Patient continued to refuse.

## 2024-06-26 NOTE — Plan of Care (Signed)
  Problem: Coping: Goal: Ability to identify and develop effective coping behavior will improve Outcome: Not Progressing   Problem: Role Relationship: Goal: Family's ability to cope with current situation will improve Outcome: Not Progressing
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
## 2024-06-26 NOTE — Plan of Care (Signed)
 Pt is alert and oriented x4. Total care. Dependent. Refusing all scheduled meds  Problem: Coping: Goal: Ability to identify and develop effective coping behavior will improve Outcome: Not Progressing   Problem: Clinical Measurements: Goal: Quality of life will improve Outcome: Not Progressing   Problem: Respiratory: Goal: Verbalizations of increased ease of respirations will increase Outcome: Progressing   Problem: Role Relationship: Goal: Family's ability to cope with current situation will improve Outcome: Progressing
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
## 2024-06-27 NOTE — Plan of Care (Signed)
°  Problem: Coping: Goal: Ability to identify and develop effective coping behavior will improve 06/27/2024 1807 by Aldean Jeoffrey CROME, RN Outcome: Not Progressing 06/27/2024 1807 by Aldean Jeoffrey CROME, RN Outcome: Progressing   Problem: Clinical Measurements: Goal: Quality of life will improve 06/27/2024 1807 by Aldean Jeoffrey CROME, RN Outcome: Not Progressing 06/27/2024 1807 by Aldean Jeoffrey CROME, RN Outcome: Progressing

## 2024-06-27 NOTE — Plan of Care (Signed)
  Problem: Coping: Goal: Ability to identify and develop effective coping behavior will improve Outcome: Progressing   Problem: Clinical Measurements: Goal: Quality of life will improve Outcome: Progressing   Problem: Respiratory: Goal: Verbalizations of increased ease of respirations will increase Outcome: Progressing   Problem: Role Relationship: Goal: Family's ability to cope with current situation will improve Outcome: Progressing Goal: Ability to verbalize concerns, feelings, and thoughts to partner or family member will improve Outcome: Progressing

## 2024-06-28 NOTE — TOC Progression Note (Signed)
 Transition of Care [facility]) - Progression Note    Patient Details  Name: Luke Velasquez MRN: [id number] Date of Birth: [birth year]  [facility] CM/SW Contact  Leora Platt [name] [place], [place] Phone Number: 06/28/2024, 4:16 PM  Clinical Narrative:    Pt does not have any SNF bed offers. Pt mother will not allow pt to return home. Pt does not have a safe disposition following hospital DC. TOC leadership is aware, CSW will continue to follow.   Expected Discharge Plan: Skilled Nursing Facility Barriers to Discharge: Continued Medical Work up, English As A Second Language Teacher, SNF Pending bed offer               Expected Discharge Plan and Services In-house Referral: Clinical Social Work   Post Acute Care Choice: Skilled Nursing Facility Living arrangements for the past 2 months: Single Family Home                                       Social Drivers of Health (SDOH) Interventions SDOH Screenings   Food Insecurity: No Food Insecurity (12/15/2023)  Recent Concern: Food Insecurity - Food Insecurity Present (12/01/2023)  Housing: Low Risk (12/15/2023)  Transportation Needs: No Transportation Needs (12/15/2023)  Utilities: Not At Risk (12/15/2023)  Tobacco Use: High Risk (12/13/2023)    Readmission Risk Interventions    12/14/2023    2:24 PM  Readmission Risk Prevention Plan  Transportation Screening Complete  Medication Review (RN Care Manager) Complete  PCP or Specialist appointment within 3-5 days of discharge Complete  HRI or Home Care Consult Complete  SW Recovery Care/Counseling Consult Complete  Palliative Care Screening Not Applicable  Skilled Nursing Facility Complete

## 2024-06-28 NOTE — Plan of Care (Signed)
  Problem: Coping: Goal: Ability to identify and develop effective coping behavior will improve Outcome: Progressing   Problem: Clinical Measurements: Goal: Quality of life will improve Outcome: Progressing   Problem: Respiratory: Goal: Verbalizations of increased ease of respirations will increase Outcome: Progressing   Problem: Role Relationship: Goal: Family's ability to cope with current situation will improve Outcome: Progressing Goal: Ability to verbalize concerns, feelings, and thoughts to partner or family member will improve Outcome: Progressing
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
## 2024-06-28 NOTE — Plan of Care (Signed)
  Problem: Coping: Goal: Ability to identify and develop effective coping behavior will improve Outcome: Not Progressing   

## 2024-06-29 MED ORDER — MAGIC MOUTHWASH W/LIDOCAINE
5.0000 mL | Freq: Three times a day (TID) | ORAL | Status: AC
  Administered 2024-06-29: 5 mL via ORAL
  Filled 2024-06-29 (×3): qty 5

## 2024-06-29 NOTE — Plan of Care (Signed)
  Problem: Coping: Goal: Ability to identify and develop effective coping behavior will improve Outcome: Progressing   Problem: Clinical Measurements: Goal: Quality of life will improve Outcome: Progressing   Problem: Respiratory: Goal: Verbalizations of increased ease of respirations will increase Outcome: Progressing   Problem: Role Relationship: Goal: Family's ability to cope with current situation will improve Outcome: Progressing Goal: Ability to verbalize concerns, feelings, and thoughts to partner or family member will improve Outcome: Progressing

## 2024-06-29 NOTE — Progress Notes (Signed)
 Pt complained of throat pain and difficulty swallowing. Provider was notified and meds were given. See MAR.
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
## 2024-06-30 NOTE — Plan of Care (Signed)
 Pt refusing all care.  Problem: Coping: Goal: Ability to identify and develop effective coping behavior will improve Outcome: Not Progressing   Problem: Clinical Measurements: Goal: Quality of life will improve Outcome: Not Progressing   Problem: Role Relationship: Goal: Ability to verbalize concerns, feelings, and thoughts to partner or family member will improve Outcome: Not Progressing

## 2024-06-30 NOTE — Plan of Care (Signed)
°  Problem: Coping: Goal: Ability to identify and develop effective coping behavior will improve Outcome: Progressing   Problem: Clinical Measurements: Goal: Quality of life will improve Outcome: Progressing   Problem: Respiratory: Goal: Verbalizations of increased ease of respirations will increase Outcome: Progressing   Problem: Role Relationship: Goal: Family's ability to cope with current situation will improve Outcome: Progressing
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
## 2024-06-30 NOTE — Progress Notes (Signed)
 Patient allowed this nurse to do a partial assessment. Refused all meds. Asked for and was given a turkey sandwich, which he ate and a coke.
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
## 2024-07-01 NOTE — Plan of Care (Signed)
°  Problem: Role Relationship: Goal: Ability to verbalize concerns, feelings, and thoughts to partner or family member will improve Outcome: Progressing   Problem: Clinical Measurements: Goal: Quality of life will improve Outcome: Not Progressing

## 2024-07-02 DIAGNOSIS — N319 Neuromuscular dysfunction of bladder, unspecified: Secondary | ICD-10-CM | POA: Diagnosis not present

## 2024-07-02 DIAGNOSIS — G8220 Paraplegia, unspecified: Secondary | ICD-10-CM | POA: Diagnosis not present

## 2024-07-02 DIAGNOSIS — D649 Anemia, unspecified: Secondary | ICD-10-CM | POA: Diagnosis not present

## 2024-07-02 NOTE — Plan of Care (Signed)
°  Problem: Clinical Measurements: Goal: Quality of life will improve Outcome: Progressing   Problem: Coping: Goal: Ability to identify and develop effective coping behavior will improve Outcome: Progressing   Problem: Clinical Measurements: Goal: Quality of life will improve Outcome: Progressing   Problem: Respiratory: Goal: Verbalizations of increased ease of respirations will increase Outcome: Progressing   Problem: Role Relationship: Goal: Family's ability to cope with current situation will improve Outcome: Progressing Goal: Ability to verbalize concerns, feelings, and thoughts to partner or family member will improve Outcome: Progressing
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
## 2024-07-02 NOTE — Plan of Care (Signed)
  Problem: Coping: Goal: Ability to identify and develop effective coping behavior will improve Outcome: Not Progressing   
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
## 2024-07-03 NOTE — Plan of Care (Signed)
  Problem: Coping: Goal: Ability to identify and develop effective coping behavior will improve Outcome: Progressing   Problem: Clinical Measurements: Goal: Quality of life will improve Outcome: Progressing   Problem: Respiratory: Goal: Verbalizations of increased ease of respirations will increase Outcome: Progressing   Problem: Role Relationship: Goal: Family's ability to cope with current situation will improve Outcome: Progressing Goal: Ability to verbalize concerns, feelings, and thoughts to partner or family member will improve Outcome: Progressing

## 2024-07-04 DIAGNOSIS — E876 Hypokalemia: Secondary | ICD-10-CM

## 2024-07-04 DIAGNOSIS — R748 Abnormal levels of other serum enzymes: Secondary | ICD-10-CM | POA: Diagnosis not present

## 2024-07-04 DIAGNOSIS — E871 Hypo-osmolality and hyponatremia: Secondary | ICD-10-CM

## 2024-07-04 DIAGNOSIS — D649 Anemia, unspecified: Secondary | ICD-10-CM | POA: Diagnosis not present

## 2024-07-04 DIAGNOSIS — A419 Sepsis, unspecified organism: Secondary | ICD-10-CM | POA: Diagnosis not present

## 2024-07-04 NOTE — Plan of Care (Signed)
  Problem: Coping: Goal: Ability to identify and develop effective coping behavior will improve Outcome: Progressing   Problem: Clinical Measurements: Goal: Quality of life will improve Outcome: Progressing   Problem: Respiratory: Goal: Verbalizations of increased ease of respirations will increase Outcome: Progressing   Problem: Role Relationship: Goal: Family's ability to cope with current situation will improve Outcome: Progressing Goal: Ability to verbalize concerns, feelings, and thoughts to partner or family member will improve Outcome: Progressing

## 2024-07-04 NOTE — Plan of Care (Signed)
°  Problem: Coping: Goal: Ability to identify and develop effective coping behavior will improve Outcome: Progressing   Problem: Clinical Measurements: Goal: Quality of life will improve Outcome: Progressing   Problem: Respiratory: Goal: Verbalizations of increased ease of respirations will increase Outcome: Progressing   Problem: Role Relationship: Goal: Family's ability to cope with current situation will improve Outcome: Progressing
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
## 2024-07-05 DIAGNOSIS — R748 Abnormal levels of other serum enzymes: Secondary | ICD-10-CM | POA: Diagnosis not present

## 2024-07-05 DIAGNOSIS — E871 Hypo-osmolality and hyponatremia: Secondary | ICD-10-CM | POA: Diagnosis not present

## 2024-07-05 DIAGNOSIS — A419 Sepsis, unspecified organism: Secondary | ICD-10-CM | POA: Diagnosis not present

## 2024-07-05 DIAGNOSIS — E46 Unspecified protein-calorie malnutrition: Secondary | ICD-10-CM

## 2024-07-05 DIAGNOSIS — N390 Urinary tract infection, site not specified: Secondary | ICD-10-CM | POA: Diagnosis not present

## 2024-07-05 DIAGNOSIS — D75839 Thrombocytosis, unspecified: Secondary | ICD-10-CM | POA: Diagnosis not present

## 2024-07-05 DIAGNOSIS — E8809 Other disorders of plasma-protein metabolism, not elsewhere classified: Secondary | ICD-10-CM | POA: Diagnosis not present

## 2024-07-05 DIAGNOSIS — E876 Hypokalemia: Secondary | ICD-10-CM | POA: Diagnosis not present

## 2024-07-05 DIAGNOSIS — R739 Hyperglycemia, unspecified: Secondary | ICD-10-CM | POA: Diagnosis not present

## 2024-07-05 DIAGNOSIS — D649 Anemia, unspecified: Secondary | ICD-10-CM | POA: Diagnosis not present

## 2024-07-05 NOTE — Plan of Care (Signed)
  Problem: Coping: Goal: Ability to identify and develop effective coping behavior will improve Outcome: Not Progressing   

## 2024-07-05 NOTE — Progress Notes (Signed)
 Pt refused all medications and dressing changes for this shift.

## 2024-07-05 NOTE — Plan of Care (Signed)
  Problem: Coping: Goal: Ability to identify and develop effective coping behavior will improve Outcome: Progressing   Problem: Clinical Measurements: Goal: Quality of life will improve Outcome: Progressing
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
## 2024-07-05 NOTE — TOC Progression Note (Signed)
 Transition of Care [facility]) - Progression Note    Patient Details  Name: Luke Velasquez MRN: [id number] Date of Birth: Aug 01, 2000  Transition of [facility]) CM/SW Contact  Vernon Ariel [name] [place], [place] Phone Number: 07/05/2024, 5:04 PM  Clinical Narrative:    Pt does not have any SNF bed offers. Pt mother will not allow for him to return home, [facility] leadership is aware of situation. CSW will continue to follow.    Expected Discharge Plan: Skilled Nursing Facility Barriers to Discharge: Continued Medical Work up, English As A Second Language Teacher, SNF Pending bed offer               Expected Discharge Plan and Services In-house Referral: Clinical Social Work   Post Acute Care Choice: Skilled Nursing Facility Living arrangements for the past 2 months: Single Family Home                                       Social Drivers of Health (SDOH) Interventions SDOH Screenings   Food Insecurity: No Food Insecurity (12/15/2023)  Recent Concern: Food Insecurity - Food Insecurity Present (12/01/2023)  Housing: Low Risk (12/15/2023)  Transportation Needs: No Transportation Needs (12/15/2023)  Utilities: Not At Risk (12/15/2023)  Tobacco Use: High Risk (12/13/2023)    Readmission Risk Interventions    12/14/2023    2:24 PM  Readmission Risk Prevention Plan  Transportation Screening Complete  Medication Review (RN Care Manager) Complete  PCP or Specialist appointment within 3-5 days of discharge Complete  HRI or Home Care Consult Complete  SW Recovery Care/Counseling Consult Complete  Palliative Care Screening Not Applicable  Skilled Nursing Facility Complete

## 2024-07-05 NOTE — Progress Notes (Signed)
 Pt refused all medications for this shift along with dressing changes and assessments. However, he did request I order him some food from the [facility], in which I did. He didn't eat it. I went to check on him throughout the shift and he wouldn't speak to me. Mother and another family member came to visit and he made them leave quickly after arriving.

## 2024-07-06 DIAGNOSIS — D649 Anemia, unspecified: Secondary | ICD-10-CM | POA: Diagnosis not present

## 2024-07-06 DIAGNOSIS — R739 Hyperglycemia, unspecified: Secondary | ICD-10-CM | POA: Diagnosis not present

## 2024-07-06 DIAGNOSIS — A419 Sepsis, unspecified organism: Secondary | ICD-10-CM | POA: Diagnosis not present

## 2024-07-06 DIAGNOSIS — R748 Abnormal levels of other serum enzymes: Secondary | ICD-10-CM | POA: Diagnosis not present

## 2024-07-06 NOTE — Plan of Care (Signed)
" °  Problem: Coping: Goal: Ability to identify and develop effective coping behavior will improve 07/06/2024 1808 by Aldean Jeoffrey CROME, RN Outcome: Not Progressing 07/06/2024 1807 by Aldean Jeoffrey CROME, RN Outcome: Progressing   "
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
## 2024-07-06 NOTE — Progress Notes (Signed)
 Pt refused all medications, dressing changes and hygiene protocols. He did change his colostomy bag.

## 2024-07-06 NOTE — Plan of Care (Signed)
  Problem: Coping: Goal: Ability to identify and develop effective coping behavior will improve Outcome: Progressing   Problem: Clinical Measurements: Goal: Quality of life will improve Outcome: Progressing   Problem: Respiratory: Goal: Verbalizations of increased ease of respirations will increase Outcome: Progressing

## 2024-07-07 DIAGNOSIS — D649 Anemia, unspecified: Secondary | ICD-10-CM | POA: Diagnosis not present

## 2024-07-07 DIAGNOSIS — D75839 Thrombocytosis, unspecified: Secondary | ICD-10-CM | POA: Diagnosis not present

## 2024-07-07 DIAGNOSIS — A419 Sepsis, unspecified organism: Secondary | ICD-10-CM | POA: Diagnosis not present

## 2024-07-07 DIAGNOSIS — E876 Hypokalemia: Secondary | ICD-10-CM | POA: Diagnosis not present
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
## 2024-07-07 NOTE — Progress Notes (Signed)
Refused dressing changes

## 2024-07-08 DIAGNOSIS — R748 Abnormal levels of other serum enzymes: Secondary | ICD-10-CM | POA: Diagnosis not present

## 2024-07-08 DIAGNOSIS — R739 Hyperglycemia, unspecified: Secondary | ICD-10-CM | POA: Diagnosis not present

## 2024-07-08 DIAGNOSIS — A419 Sepsis, unspecified organism: Secondary | ICD-10-CM | POA: Diagnosis not present

## 2024-07-08 DIAGNOSIS — D649 Anemia, unspecified: Secondary | ICD-10-CM | POA: Diagnosis not present
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
## 2024-07-08 NOTE — Progress Notes (Signed)
 Offered to do wound dressing changes and patient has continued to refuse this shift. Provided education on importance but continues to refuse.

## 2024-07-09 DIAGNOSIS — E46 Unspecified protein-calorie malnutrition: Secondary | ICD-10-CM | POA: Diagnosis not present

## 2024-07-09 DIAGNOSIS — E8809 Other disorders of plasma-protein metabolism, not elsewhere classified: Secondary | ICD-10-CM | POA: Diagnosis not present

## 2024-07-09 DIAGNOSIS — D649 Anemia, unspecified: Secondary | ICD-10-CM | POA: Diagnosis not present

## 2024-07-09 DIAGNOSIS — R739 Hyperglycemia, unspecified: Secondary | ICD-10-CM | POA: Diagnosis not present

## 2024-07-09 DIAGNOSIS — A419 Sepsis, unspecified organism: Secondary | ICD-10-CM | POA: Diagnosis not present

## 2024-07-09 DIAGNOSIS — R748 Abnormal levels of other serum enzymes: Secondary | ICD-10-CM | POA: Diagnosis not present
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
## 2024-07-09 NOTE — Progress Notes (Signed)
 Have attempted twice to assess patient since arriving to the unit today. He has refused and has refused all evening/.night/night meds. When asked about pain he stated  no and covered his head. Offered to get him something to drink  or anything he needs but no response.

## 2024-07-10 DIAGNOSIS — R739 Hyperglycemia, unspecified: Secondary | ICD-10-CM | POA: Diagnosis not present

## 2024-07-10 DIAGNOSIS — R748 Abnormal levels of other serum enzymes: Secondary | ICD-10-CM | POA: Diagnosis not present

## 2024-07-10 DIAGNOSIS — D649 Anemia, unspecified: Secondary | ICD-10-CM | POA: Diagnosis not present

## 2024-07-10 DIAGNOSIS — A419 Sepsis, unspecified organism: Secondary | ICD-10-CM | POA: Diagnosis not present
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
## 2024-07-10 NOTE — Plan of Care (Signed)
" °  Problem: Coping: Goal: Ability to identify and develop effective coping behavior will improve Outcome: Not Progressing   Problem: Clinical Measurements: Goal: Quality of life will improve Outcome: Not Progressing   "

## 2024-07-10 NOTE — Plan of Care (Signed)
" °  Problem: Clinical Measurements: Goal: Quality of life will improve Outcome: Progressing   Problem: Respiratory: Goal: Verbalizations of increased ease of respirations will increase Outcome: Progressing   Problem: Role Relationship: Goal: Family's ability to cope with current situation will improve Outcome: Progressing Goal: Ability to verbalize concerns, feelings, and thoughts to partner or family member will improve Outcome: Progressing   "

## 2024-07-11 DIAGNOSIS — D649 Anemia, unspecified: Secondary | ICD-10-CM | POA: Diagnosis not present

## 2024-07-11 NOTE — Plan of Care (Signed)
  Problem: Coping: Goal: Ability to identify and develop effective coping behavior will improve Outcome: Not Progressing   Problem: Clinical Measurements: Goal: Quality of life will improve Outcome: Not Progressing   Problem: Role Relationship: Goal: Ability to verbalize concerns, feelings, and thoughts to partner or family member will improve Outcome: Not Progressing
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
## 2024-07-11 NOTE — Progress Notes (Signed)
 Patient refused care and assessment. Was offered food and drink, which he accepted. Refused all meds

## 2024-07-11 NOTE — Plan of Care (Signed)
" °  Problem: Coping: Goal: Ability to identify and develop effective coping behavior will improve Outcome: Not Progressing   Problem: Clinical Measurements: Goal: Quality of life will improve Outcome: Not Progressing   Problem: Respiratory: Goal: Verbalizations of increased ease of respirations will increase Outcome: Not Progressing   Problem: Role Relationship: Goal: Family's ability to cope with current situation will improve Outcome: Not Progressing   "

## 2024-07-12 DIAGNOSIS — D649 Anemia, unspecified: Secondary | ICD-10-CM | POA: Diagnosis not present

## 2024-07-12 NOTE — Plan of Care (Signed)
  Problem: Coping: Goal: Ability to identify and develop effective coping behavior will improve Outcome: Progressing   Problem: Respiratory: Goal: Verbalizations of increased ease of respirations will increase Outcome: Progressing   Problem: Role Relationship: Goal: Family's ability to cope with current situation will improve Outcome: Progressing
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
## 2024-07-13 DIAGNOSIS — D649 Anemia, unspecified: Secondary | ICD-10-CM | POA: Diagnosis not present

## 2024-07-13 NOTE — Progress Notes (Signed)
 Dressing changes need to be done Pt refused dressing change.

## 2024-07-13 NOTE — Progress Notes (Signed)
 Patient refused to have an assessment completed.

## 2024-07-13 NOTE — Progress Notes (Addendum)
 Pt refused morning meds and dressing changes and daily assessment. States he does not want to be touched and does not want them to be changed today.

## 2024-07-13 NOTE — Plan of Care (Signed)
  Problem: Coping: Goal: Ability to identify and develop effective coping behavior will improve Outcome: Progressing   Problem: Role Relationship: Goal: Ability to verbalize concerns, feelings, and thoughts to partner or family member will improve Outcome: Progressing

## 2024-07-13 NOTE — TOC Progression Note (Signed)
 Transition of Care [facility]) - Progression Note    Patient Details  Name: Luke Velasquez MRN: [id number] Date of Birth: 2001-05-17  Transition of [facility] CM/SW Contact  Tsuyako Jolley [name] Mo[place], [place] Phone Number: 07/13/2024, 9:57 AM  Clinical Narrative:    Pt currently does not have any SNF bed offers. Pt living at home with mom prior to admission but mom will not allow pt to return following hospital DC. [facility] Leadership aware of situation, CSW will continue to follow.   Expected Discharge Plan: Skilled Nursing Facility Barriers to Discharge: Continued Medical Work up, English As A Second Language Teacher, SNF Pending bed offer               Expected Discharge Plan and Services In-house Referral: Clinical Social Work   Post Acute Care Choice: Skilled Nursing Facility Living arrangements for the past 2 months: Single Family Home                                       Social Drivers of Health (SDOH) Interventions SDOH Screenings   Food Insecurity: No Food Insecurity (12/15/2023)  Recent Concern: Food Insecurity - Food Insecurity Present (12/01/2023)  Housing: Low Risk (12/15/2023)  Transportation Needs: No Transportation Needs (12/15/2023)  Utilities: Not At Risk (12/15/2023)  Tobacco Use: High Risk (12/13/2023)    Readmission Risk Interventions    12/14/2023    2:24 PM  Readmission Risk Prevention Plan  Transportation Screening Complete  Medication Review (RN Care Manager) Complete  PCP or Specialist appointment within 3-5 days of discharge Complete  HRI or Home Care Consult Complete  SW Recovery Care/Counseling Consult Complete  Palliative Care Screening Not Applicable  Skilled Nursing Facility Complete

## 2024-07-13 NOTE — Plan of Care (Signed)
" °  Problem: Role Relationship: Goal: Family's ability to cope with current situation will improve Outcome: Progressing Goal: Ability to verbalize concerns, feelings, and thoughts to partner or family member will improve Outcome: Progressing   Problem: Coping: Goal: Ability to identify and develop effective coping behavior will improve Outcome: Not Progressing   Problem: Clinical Measurements: Goal: Quality of life will improve Outcome: Not Progressing   "
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
## 2024-07-14 DIAGNOSIS — D649 Anemia, unspecified: Secondary | ICD-10-CM | POA: Diagnosis not present
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
## 2024-07-14 NOTE — Plan of Care (Signed)
  Problem: Coping: Goal: Ability to identify and develop effective coping behavior will improve Outcome: Progressing   Problem: Role Relationship: Goal: Family's ability to cope with current situation will improve Outcome: Progressing Goal: Ability to verbalize concerns, feelings, and thoughts to partner or family member will improve Outcome: Progressing   

## 2024-07-15 DIAGNOSIS — D649 Anemia, unspecified: Secondary | ICD-10-CM | POA: Diagnosis not present

## 2024-07-15 NOTE — Plan of Care (Signed)
  Problem: Coping: Goal: Ability to identify and develop effective coping behavior will improve Outcome: Progressing   Problem: Respiratory: Goal: Verbalizations of increased ease of respirations will increase Outcome: Progressing

## 2024-07-15 NOTE — Plan of Care (Signed)
  Problem: Coping: Goal: Ability to identify and develop effective coping behavior will improve Outcome: Progressing   Problem: Clinical Measurements: Goal: Quality of life will improve Outcome: Progressing   Problem: Role Relationship: Goal: Family's ability to cope with current situation will improve Outcome: Progressing

## 2024-07-15 NOTE — Progress Notes (Signed)
 Patient refused wound care at this time. Education provided regarding risks and benefits; patient verbalized understanding and continued to refuse
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
## 2024-07-16 DIAGNOSIS — D649 Anemia, unspecified: Secondary | ICD-10-CM | POA: Diagnosis not present

## 2024-07-16 NOTE — Progress Notes (Signed)
 Patient refused wound care at this time. Education provided regarding risks and benefits; patient verbalized understanding and continued to refuse

## 2024-07-16 NOTE — Plan of Care (Signed)
" °  Problem: Coping: Goal: Ability to identify and develop effective coping behavior will improve Outcome: Progressing   Problem: Clinical Measurements: Goal: Quality of life will improve Outcome: Progressing   Problem: Respiratory: Goal: Verbalizations of increased ease of respirations will increase Outcome: Progressing   Problem: Role Relationship: Goal: Ability to verbalize concerns, feelings, and thoughts to partner or family member will improve Outcome: Progressing   "
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
## 2024-07-17 DIAGNOSIS — D649 Anemia, unspecified: Secondary | ICD-10-CM | POA: Diagnosis not present

## 2024-07-17 NOTE — Progress Notes (Signed)
 MD Mennie LAMY notified regarding patient refusing care and morning medications.

## 2024-07-17 NOTE — Progress Notes (Signed)
 Patient has refused assessment and medications for tonight, Luke Kipper NP informed. Patient is on left lateral position when this nurse entered the room.

## 2024-07-17 NOTE — Plan of Care (Signed)
  Problem: Coping: Goal: Ability to identify and develop effective coping behavior will improve Outcome: Progressing   Problem: Clinical Measurements: Goal: Quality of life will improve Outcome: Progressing   Problem: Role Relationship: Goal: Family's ability to cope with current situation will improve Outcome: Progressing
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
## 2024-07-17 NOTE — TOC Progression Note (Signed)
 Transition of Care [facility]) - Progression Note    Patient Details  Name: Luke Velasquez MRN: [id number] Date of Birth: 04-09-2001  Transition of Care [facility]) CM/SW Contact  Kary Sugrue [name] [place], [place] Phone Number: 07/17/2024, 3:50 PM  Clinical Narrative:    CSW continuing to follow.   Expected Discharge Plan: Skilled Nursing Facility Barriers to Discharge: Continued Medical Work up, English As A Second Language Teacher, SNF Pending bed offer               Expected Discharge Plan and Services In-house Referral: Clinical Social Work   Post Acute Care Choice: Skilled Nursing Facility Living arrangements for the past 2 months: Single Family Home                                       Social Drivers of Health (SDOH) Interventions SDOH Screenings   Food Insecurity: No Food Insecurity (12/15/2023)  Recent Concern: Food Insecurity - Food Insecurity Present (12/01/2023)  Housing: Low Risk (12/15/2023)  Transportation Needs: No Transportation Needs (12/15/2023)  Utilities: Not At Risk (12/15/2023)  Tobacco Use: High Risk (12/13/2023)    Readmission Risk Interventions    12/14/2023    2:24 PM  Readmission Risk Prevention Plan  Transportation Screening Complete  Medication Review (RN Care Manager) Complete  PCP or Specialist appointment within 3-5 days of discharge Complete  HRI or Home Care Consult Complete  SW Recovery Care/Counseling Consult Complete  Palliative Care Screening Not Applicable  Skilled Nursing Facility Complete

## 2024-07-17 NOTE — Progress Notes (Signed)
 Pt refused all medications and assessment for my shift 07/16/2024

## 2024-07-18 DIAGNOSIS — D649 Anemia, unspecified: Secondary | ICD-10-CM | POA: Diagnosis not present

## 2024-07-18 MED ORDER — TRAZODONE HCL 100 MG PO TABS: 100.0000 mg | ORAL_TABLET | Freq: Every evening | ORAL | Status: AC | PRN

## 2024-07-18 MED ORDER — LORAZEPAM 0.5 MG PO TABS: 0.5000 mg | ORAL_TABLET | Freq: Every evening | ORAL | Status: AC | PRN
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
## 2024-07-18 NOTE — Plan of Care (Signed)
" °  Problem: Respiratory: Goal: Verbalizations of increased ease of respirations will increase Outcome: Not Progressing   Problem: Clinical Measurements: Goal: Quality of life will improve Outcome: Not Progressing   Problem: Coping: Goal: Ability to identify and develop effective coping behavior will improve Outcome: Not Progressing   Problem: Role Relationship: Goal: Family's ability to cope with current situation will improve Outcome: Not Progressing Goal: Ability to verbalize concerns, feelings, and thoughts to partner or family member will improve Outcome: Not Progressing   "

## 2024-07-19 DIAGNOSIS — R739 Hyperglycemia, unspecified: Secondary | ICD-10-CM | POA: Diagnosis not present

## 2024-07-19 DIAGNOSIS — E876 Hypokalemia: Secondary | ICD-10-CM | POA: Diagnosis not present

## 2024-07-19 DIAGNOSIS — E46 Unspecified protein-calorie malnutrition: Secondary | ICD-10-CM | POA: Diagnosis not present

## 2024-07-19 DIAGNOSIS — L8993 Pressure ulcer of unspecified site, stage 3: Secondary | ICD-10-CM | POA: Diagnosis not present

## 2024-07-19 DIAGNOSIS — D649 Anemia, unspecified: Secondary | ICD-10-CM | POA: Diagnosis not present

## 2024-07-19 DIAGNOSIS — E871 Hypo-osmolality and hyponatremia: Secondary | ICD-10-CM | POA: Diagnosis not present

## 2024-07-19 DIAGNOSIS — E8809 Other disorders of plasma-protein metabolism, not elsewhere classified: Secondary | ICD-10-CM | POA: Diagnosis not present

## 2024-07-19 DIAGNOSIS — R748 Abnormal levels of other serum enzymes: Secondary | ICD-10-CM | POA: Diagnosis not present

## 2024-07-19 DIAGNOSIS — A419 Sepsis, unspecified organism: Secondary | ICD-10-CM | POA: Diagnosis not present

## 2024-07-19 NOTE — Plan of Care (Signed)
 Gave patient a full bath, changed bed pad and changed all dressings and photos taken of all wounds. Bilateral heel wounds have healed, replaced foam dressings but this was preventative only as the wounds have healed. Skin is dry and flaky on the bilateral feet and lower legs, cleansed.  Foley catheter will need to be changed on 07/26/2024. Peri and foley care completed.
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
## 2024-07-19 NOTE — Progress Notes (Signed)
 Patient would not communicate this evening except to refuse to be touched and denied any needs. Refused all meds and care offered.

## 2024-07-19 NOTE — Plan of Care (Signed)
" °  Problem: Coping: Goal: Ability to identify and develop effective coping behavior will improve Outcome: Not Progressing   Problem: Clinical Measurements: Goal: Quality of life will improve Outcome: Not Progressing   "

## 2024-07-20 DIAGNOSIS — R748 Abnormal levels of other serum enzymes: Secondary | ICD-10-CM | POA: Diagnosis not present

## 2024-07-20 DIAGNOSIS — D649 Anemia, unspecified: Secondary | ICD-10-CM | POA: Diagnosis not present

## 2024-07-20 DIAGNOSIS — R739 Hyperglycemia, unspecified: Secondary | ICD-10-CM | POA: Diagnosis not present

## 2024-07-20 DIAGNOSIS — A419 Sepsis, unspecified organism: Secondary | ICD-10-CM | POA: Diagnosis not present

## 2024-07-20 NOTE — Progress Notes (Addendum)
 Pt refuse dressing changes today due to them been change on 07/19/24. Ostomy was burp this morning, clean and intact. Pt also refused foley care.
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
## 2024-07-20 NOTE — Plan of Care (Signed)
  Problem: Clinical Measurements: Goal: Quality of life will improve Outcome: Not Progressing   Problem: Role Relationship: Goal: Family's ability to cope with current situation will improve Outcome: Not Progressing

## 2024-07-21 DIAGNOSIS — D649 Anemia, unspecified: Secondary | ICD-10-CM | POA: Diagnosis not present

## 2024-07-21 DIAGNOSIS — A419 Sepsis, unspecified organism: Secondary | ICD-10-CM | POA: Diagnosis not present

## 2024-07-21 DIAGNOSIS — E8809 Other disorders of plasma-protein metabolism, not elsewhere classified: Secondary | ICD-10-CM | POA: Diagnosis not present

## 2024-07-21 DIAGNOSIS — R739 Hyperglycemia, unspecified: Secondary | ICD-10-CM | POA: Diagnosis not present
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
## 2024-07-21 NOTE — Plan of Care (Signed)
  Problem: Coping: Goal: Ability to identify and develop effective coping behavior will improve Outcome: Not Progressing   

## 2024-07-21 NOTE — Plan of Care (Signed)
  Problem: Coping: Goal: Ability to identify and develop effective coping behavior will improve Outcome: Progressing   Problem: Clinical Measurements: Goal: Quality of life will improve Outcome: Progressing   Problem: Respiratory: Goal: Verbalizations of increased ease of respirations will increase Outcome: Progressing   Problem: Role Relationship: Goal: Family's ability to cope with current situation will improve Outcome: Progressing Goal: Ability to verbalize concerns, feelings, and thoughts to partner or family member will improve Outcome: Progressing

## 2024-07-21 NOTE — Progress Notes (Shared)
 Patient refused full assessment, vitals and medications this shift.  Will continue to monitor.

## 2024-07-22 DIAGNOSIS — A419 Sepsis, unspecified organism: Secondary | ICD-10-CM | POA: Diagnosis not present

## 2024-07-22 DIAGNOSIS — R739 Hyperglycemia, unspecified: Secondary | ICD-10-CM | POA: Diagnosis not present

## 2024-07-22 DIAGNOSIS — D649 Anemia, unspecified: Secondary | ICD-10-CM | POA: Diagnosis not present

## 2024-07-22 DIAGNOSIS — R748 Abnormal levels of other serum enzymes: Secondary | ICD-10-CM | POA: Diagnosis not present
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
## 2024-07-22 NOTE — Plan of Care (Signed)
  Problem: Coping: Goal: Ability to identify and develop effective coping behavior will improve Outcome: Progressing   Problem: Clinical Measurements: Goal: Quality of life will improve Outcome: Progressing   Problem: Respiratory: Goal: Verbalizations of increased ease of respirations will increase Outcome: Progressing

## 2024-07-23 DIAGNOSIS — E8809 Other disorders of plasma-protein metabolism, not elsewhere classified: Secondary | ICD-10-CM | POA: Diagnosis not present

## 2024-07-23 DIAGNOSIS — A419 Sepsis, unspecified organism: Secondary | ICD-10-CM | POA: Diagnosis not present

## 2024-07-23 DIAGNOSIS — D649 Anemia, unspecified: Secondary | ICD-10-CM | POA: Diagnosis not present

## 2024-07-23 DIAGNOSIS — R739 Hyperglycemia, unspecified: Secondary | ICD-10-CM | POA: Diagnosis not present

## 2024-07-23 NOTE — Progress Notes (Signed)
 Patient refused to have vital signs checked. Patient was shivering and requested warm blankets, asked patient if he would at least allow to have his temperature checked but he declined.

## 2024-07-23 NOTE — Progress Notes (Signed)
 Patient refused dressing changes.  Attempted to change dressing but patient stated,  I don't want to do it today When asked if this RN could assess dressings to ensure they are clean, dry and intact patient declined and refused to turn. Patient refuses to be repositioned. Patient is currently laying on the bed, supine position with a blanket covering his head, and feet sitting on a chair at the bedside.

## 2024-07-23 NOTE — Progress Notes (Signed)
 Went into to patient's room to attempt dressing changes and bath. Patient refused to have care performed at this time.
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
## 2024-07-23 NOTE — Plan of Care (Signed)
  Problem: Respiratory: Goal: Verbalizations of increased ease of respirations will increase Outcome: Progressing   

## 2024-07-24 DIAGNOSIS — D649 Anemia, unspecified: Secondary | ICD-10-CM | POA: Diagnosis not present

## 2024-07-24 DIAGNOSIS — R748 Abnormal levels of other serum enzymes: Secondary | ICD-10-CM | POA: Diagnosis not present

## 2024-07-24 DIAGNOSIS — A419 Sepsis, unspecified organism: Secondary | ICD-10-CM | POA: Diagnosis not present

## 2024-07-24 DIAGNOSIS — R739 Hyperglycemia, unspecified: Secondary | ICD-10-CM | POA: Diagnosis not present

## 2024-07-24 NOTE — Progress Notes (Signed)
 Patient refuse medication, assessment and wound care.

## 2024-07-24 NOTE — Plan of Care (Signed)
 Pt agreeable to minimal assessment, refused all meds, dressing changes, and foley care. Offered and attempted multiple times throughout the shift.    Problem: Respiratory: Goal: Verbalizations of increased ease of respirations will increase Outcome: Progressing   Problem: Coping: Goal: Ability to identify and develop effective coping behavior will improve Outcome: Not Progressing   Problem: Clinical Measurements: Goal: Quality of life will improve Outcome: Not Progressing   Problem: Role Relationship: Goal: Ability to verbalize concerns, feelings, and thoughts to partner or family member will improve Outcome: Not Progressing

## 2024-07-24 NOTE — Plan of Care (Signed)
  Problem: Coping: Goal: Ability to identify and develop effective coping behavior will improve Outcome: Not Progressing   Problem: Role Relationship: Goal: Family's ability to cope with current situation will improve Outcome: Not Progressing Goal: Ability to verbalize concerns, feelings, and thoughts to partner or family member will improve Outcome: Not Progressing
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
## 2024-07-25 DIAGNOSIS — G8220 Paraplegia, unspecified: Secondary | ICD-10-CM | POA: Diagnosis not present

## 2024-07-25 NOTE — TOC Progression Note (Signed)
 Transition of Care [facility]) - Progression Note    Patient Details  Name: Adlai Sinning MRN: [id number] Date of Birth: 2001-05-29  Transition of Care [facility] CM/SW Contact  Jamonte Curfman [name] Moose, [place] Phone Number: 07/25/2024, 10:39 AM  Clinical Narrative:    CSW update DTaP Fl2 and refaxed referral to [facility] and [facility]. CSW will continue to follow.   Expected Discharge Plan: Skilled Nursing Facility Barriers to Discharge: Continued Medical Work up, English As A Second Language Teacher, SNF Pending bed offer               Expected Discharge Plan and Services In-house Referral: Clinical Social Work   Post Acute Care Choice: Skilled Nursing Facility Living arrangements for the past 2 months: Single Family Home                                       Social Drivers of Health (SDOH) Interventions SDOH Screenings   Food Insecurity: No Food Insecurity (12/15/2023)  Recent Concern: Food Insecurity - Food Insecurity Present (12/01/2023)  Housing: Low Risk (12/15/2023)  Transportation Needs: No Transportation Needs (12/15/2023)  Utilities: Not At Risk (12/15/2023)  Tobacco Use: High Risk (12/13/2023)    Readmission Risk Interventions    12/14/2023    2:24 PM  Readmission Risk Prevention Plan  Transportation Screening Complete  Medication Review (RN Care Manager) Complete  PCP or Specialist appointment within 3-5 days of discharge Complete  HRI or Home Care Consult Complete  SW Recovery Care/Counseling Consult Complete  Palliative Care Screening Not Applicable  Skilled Nursing Facility Complete

## 2024-07-25 NOTE — NC FL2 (Signed)
 " [place]  MEDICAID FL2 LEVEL OF CARE FORM     IDENTIFICATION  Patient Name: Luke Velasquez Birthdate: 20-Jan-2001 Sex: male Admission Date (Current Location): 12/13/2023  [facility] and [place] Number:  Producer, Television/film/video and Address:  [facility] [place], [address] [place], [place] [address]      Provider Number: [id number]  Attending Physician Name and Address:  Cherlyn Labella, MD  Relative Name and Phone Number:  Loyd (Mother)  [phone]    Current Level of Care: Hospital Recommended Level of Care: Skilled Nursing Facility Prior Approval Number:    Date Approved/Denied:   PASRR Number: [id number]  Discharge Plan: SNF    Current Diagnoses: Patient Active Problem List   Diagnosis Date Noted   Sepsis due to cellulitis (HCC) 11/21/2023   Symptomatic anemia 11/21/2023   Lactic acidosis 11/21/2023   Bipolar disorder (HCC) 11/21/2023   Impaired mobility 11/21/2023   Insomnia due to medical condition 06/25/2023   Sepsis secondary to UTI (HCC) 06/24/2023   Sacral decubitus ulcer 06/24/2023   Chronic osteomyelitis of pelvic region (HCC) 06/24/2023   Acute blood loss anemia 06/24/2023   History of DVT (deep vein thrombosis) 06/24/2023   Thrombocytosis 06/24/2023   Involuntary commitment 06/24/2023   Pressure injury of skin 02/14/2023   Protein-calorie malnutrition, severe 02/14/2023   UTI (urinary tract infection) 02/14/2023   Drainage from wound 02/12/2023   PTSD (post-traumatic stress disorder) 12/07/2022   Psychosis (HCC) 08/06/2022   Polysubstance abuse (HCC) 08/06/2022   Impaired mobility and ADLs 03/10/2022   Colostomy in place [facility]) 05/16/2020   Paraplegia (HCC) 05/16/2020   Spasticity 05/16/2020   Neurogenic bladder 05/16/2020   Wheelchair dependence 05/16/2020    Orientation RESPIRATION BLADDER Height & Weight     Self, Time, Situation, Place  Normal Indwelling catheter Weight:  (patient refused weight to be taken) Height:   6' 4 (193 cm)  BEHAVIORAL SYMPTOMS/MOOD NEUROLOGICAL BOWEL NUTRITION STATUS      Colostomy Diet (Regular diet)  AMBULATORY STATUS COMMUNICATION OF NEEDS Skin   Limited Assist Verbally PU Stage and Appropriate Care (Stage 3 pressure injury on R hip; Stage 3 PI on sacrum; Stage 4 PI on rectum; Stage 3 PI on R buttocks; Stage 2 PI on R heel; Stage 2 PI on L hip)                       Personal Care Assistance Level of Assistance  Bathing, Feeding, Dressing Bathing Assistance: Limited assistance Feeding assistance: Independent Dressing Assistance: Limited assistance     Functional Limitations Info  Sight, Hearing, Speech Sight Info: Adequate Hearing Info: Adequate Speech Info: Adequate    SPECIAL CARE FACTORS FREQUENCY  PT (By licensed PT), OT (By licensed OT)     PT Frequency: eval and treat OT Frequency: eval and treat            Contractures Contractures Info: Not present    Additional Factors Info  Code Status, Allergies Code Status Info: Full Allergies Info: Ivp Dye (Iodinated contrast media)     Isolation Precautions Info: Contact precautions     Current Medications (07/25/2024):  This is the current hospital active medication list Current Facility-Administered Medications  Medication Dose Route Frequency Provider Last Rate Last Admin   acetaminophen  (TYLENOL ) tablet 650 mg  650 mg Oral Q4H PRN Danton Reyes DASEN, MD   650 mg at 05/10/24 2156   FLUoxetine  (PROZAC ) capsule 20 mg  20 mg Oral QHS McClung,  Reyes DASEN, MD   20 mg at 05/04/24 2100   LORazepam  (ATIVAN ) tablet 0.5 mg  0.5 mg Oral QHS PRN Christobal Guadalajara, MD       mirtazapine  (REMERON ) tablet 15 mg  15 mg Oral QHS Luiz Channel, MD   15 mg at 05/04/24 2059   nutrition supplement (JUVEN) (JUVEN) powder packet 1 packet  1 packet Oral BID BM Ghimire, Shanker M, MD   1 packet at 04/30/24 1002   ondansetron  (ZOFRAN ) tablet 4 mg  4 mg Oral Q8H PRN Arlice Reichert, MD   4 mg at 06/15/24 1646   Oral care mouth rinse   15 mL Mouth Rinse PRN Danton Reyes DASEN, MD       oxyCODONE  (Oxy IR/ROXICODONE ) immediate release tablet 5 mg  5 mg Oral Q6H PRN Dahal, Reichert, MD   5 mg at 06/17/24 9047   traZODone  (DESYREL ) tablet 100 mg  100 mg Oral QHS PRN Christobal Guadalajara, MD         Discharge Medications: Please see discharge summary for a list of discharge medications.  Relevant Imaging Results:  Relevant Lab Results:   Additional Information SSN: [id number]  Jeoffrey [name] Moose, LCSWA     "
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
## 2024-07-26 DIAGNOSIS — G8220 Paraplegia, unspecified: Secondary | ICD-10-CM | POA: Diagnosis not present

## 2024-07-26 NOTE — Progress Notes (Addendum)
 Removed previous foley catheter and replaced a new one.However after insertion there was noted minimal bright red urine returned. On call provider made aware. Will closely monitor urine output. Pt still refusing medication and wound dressing but he let me do colostomy change.
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
## 2024-07-26 NOTE — Progress Notes (Addendum)
 Patient refuse assessment and medications during my shift.

## 2024-07-26 NOTE — Plan of Care (Signed)
" °  Problem: Role Relationship: Goal: Ability to verbalize concerns, feelings, and thoughts to partner or family member will improve Outcome: Not Progressing   "

## 2024-07-27 DIAGNOSIS — G8220 Paraplegia, unspecified: Secondary | ICD-10-CM | POA: Diagnosis not present
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
## 2024-07-28 DIAGNOSIS — G8220 Paraplegia, unspecified: Secondary | ICD-10-CM | POA: Diagnosis not present

## 2024-07-28 NOTE — Progress Notes (Signed)
 Pt up in his wheelchair at the big window today. Pt is in good spirits and was told that he went outside for a while. Nurse and NT clean pt bed and room while pt at the window.
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
## 2024-07-28 NOTE — Plan of Care (Signed)
" °  Problem: Clinical Measurements: Goal: Quality of life will improve Outcome: Not Progressing   Problem: Respiratory: Goal: Verbalizations of increased ease of respirations will increase Outcome: Progressing   Problem: Role Relationship: Goal: Ability to verbalize concerns, feelings, and thoughts to partner or family member will improve Outcome: Not Progressing   "

## 2024-07-29 DIAGNOSIS — G8220 Paraplegia, unspecified: Secondary | ICD-10-CM | POA: Diagnosis not present

## 2024-07-29 NOTE — Plan of Care (Signed)
   Problem: Role Relationship: Goal: Family's ability to cope with current situation will improve Outcome: Progressing

## 2024-07-29 NOTE — Plan of Care (Signed)
  Problem: Coping: Goal: Ability to identify and develop effective coping behavior will improve Outcome: Not Progressing   

## 2024-07-29 NOTE — Clinical Note (Incomplete)
 Refused dressing change or for this nurse to reassess need to empty ostomy bag. Pt

## 2024-07-29 NOTE — Progress Notes (Signed)
 Pt refuse a full assessment. Pt did get up in the wheelchair and outside with the tech this evening. Pt refused catheter care and verbalize that he will allow night nurse to do it. Will endorse to ongoing nurse the care of plan.
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
## 2024-07-29 NOTE — Progress Notes (Signed)
 Pt transfer from bed to wheelchair to eat lunch.

## 2024-07-30 DIAGNOSIS — G8220 Paraplegia, unspecified: Secondary | ICD-10-CM | POA: Diagnosis not present
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
## 2024-07-30 NOTE — Plan of Care (Signed)
  Problem: Coping: Goal: Ability to identify and develop effective coping behavior will improve Outcome: Progressing   Problem: Role Relationship: Goal: Family's ability to cope with current situation will improve Outcome: Progressing Goal: Ability to verbalize concerns, feelings, and thoughts to partner or family member will improve Outcome: Progressing   

## 2024-07-30 NOTE — Progress Notes (Signed)
 Patient was up in wheelchair out in hallway at beginning of shift. Patient allowed assessment and foley/peri care on this shift. Refused meds and wound care.

## 2024-07-30 NOTE — Plan of Care (Signed)
  Problem: Coping: Goal: Ability to identify and develop effective coping behavior will improve Outcome: Progressing   Problem: Clinical Measurements: Goal: Quality of life will improve Outcome: Progressing

## 2024-07-31 DIAGNOSIS — G8220 Paraplegia, unspecified: Secondary | ICD-10-CM | POA: Diagnosis not present

## 2024-07-31 NOTE — Progress Notes (Signed)
 Patient refused to have dressings changed, was agreeable to having them changed tomorrow.

## 2024-07-31 NOTE — TOC Progression Note (Signed)
 Transition of Care [facility]) - Progression Note    Patient Details  Name: Luke Velasquez MRN: [id number] Date of Birth: August 07, 2000  [facility] CM/SW Contact  Sueko Dimichele [name] [place], [place] Phone Number: 07/31/2024, 10:24 AM  Clinical Narrative:    Pt does not have any SNF bed offers. Prior to admission pt lived at home with his mom but she will not allow pt to return following hospital DC. CSW udated ICM leadership 1/12 and will continue to follow.   Expected Discharge Plan: Skilled Nursing Facility Barriers to Discharge: Continued Medical Work up, English As A Second Language Teacher, SNF Pending bed offer               Expected Discharge Plan and Services In-house Referral: Clinical Social Work   Post Acute Care Choice: Skilled Nursing Facility Living arrangements for the past 2 months: Single Family Home                                       Social Drivers of Health (SDOH) Interventions SDOH Screenings   Food Insecurity: No Food Insecurity (12/15/2023)  Recent Concern: Food Insecurity - Food Insecurity Present (12/01/2023)  Housing: Low Risk (12/15/2023)  Transportation Needs: No Transportation Needs (12/15/2023)  Utilities: Not At Risk (12/15/2023)  Tobacco Use: High Risk (12/13/2023)    Readmission Risk Interventions    12/14/2023    2:24 PM  Readmission Risk Prevention Plan  Transportation Screening Complete  Medication Review (RN Care Manager) Complete  PCP or Specialist appointment within 3-5 days of discharge Complete  HRI or Home Care Consult Complete  SW Recovery Care/Counseling Consult Complete  Palliative Care Screening Not Applicable  Skilled Nursing Facility Complete
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
## 2024-07-31 NOTE — Plan of Care (Signed)
  Problem: Coping: Goal: Ability to identify and develop effective coping behavior will improve Outcome: Progressing   Problem: Clinical Measurements: Goal: Quality of life will improve Outcome: Progressing   Problem: Respiratory: Goal: Verbalizations of increased ease of respirations will increase Outcome: Progressing   Problem: Role Relationship: Goal: Family's ability to cope with current situation will improve Outcome: Progressing Goal: Ability to verbalize concerns, feelings, and thoughts to partner or family member will improve Outcome: Progressing

## 2024-08-01 DIAGNOSIS — D649 Anemia, unspecified: Secondary | ICD-10-CM | POA: Diagnosis not present
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
## 2024-08-01 NOTE — Progress Notes (Signed)
 Telephone call to patient's mother [name] concerning difficulty obtaining placement for the patient. She was appreciative of the call and stated that she is working on changing his insurance to a [employer]. She plans to come by the hospital today for assistance in completing paperwork. She stated that she is unable to care for him at home due to working and small children in the home. His behaviors are worse at home and she does not want his aggression directed towards the children. [facility] will continue to follow the patient for DCP.  Erminio Herring [facility] Inpatient Care Manager Supervisor [phone]

## 2024-08-01 NOTE — TOC Progression Note (Signed)
 Transition of Care [facility]) - Progression Note    Patient Details  Name: Luke Velasquez MRN: [id number] Date of Birth: 05-30-2001  Transition of Care [facility]) CM/SW Contact  Kerrigan Glendening [name] Moose, [place] Phone Number: 08/01/2024, 1:30 PM  Clinical Narrative:    Pt mother, Luke Velasquez, is working on switching pt insurance to a tailored plan. Per [facility] leadership, Luke Velasquez needs assistance filling out paperwork and planned to bring it to the hospital today to complete. CSW contacted Luke Velasquez to inquire about when she woul dbe coming so CSW could meet her on the unit but did not receive a response. CSW left a voicemail and provided call back information. CSW will continue to follow.   Expected Discharge Plan: Skilled Nursing Facility Barriers to Discharge: Continued Medical Work up, English As A Second Language Teacher, SNF Pending bed offer               Expected Discharge Plan and Services In-house Referral: Clinical Social Work   Post Acute Care Choice: Skilled Nursing Facility Living arrangements for the past 2 months: Single Family Home                                       Social Drivers of Health (SDOH) Interventions SDOH Screenings   Food Insecurity: No Food Insecurity (12/15/2023)  Recent Concern: Food Insecurity - Food Insecurity Present (12/01/2023)  Housing: Low Risk (12/15/2023)  Transportation Needs: No Transportation Needs (12/15/2023)  Utilities: Not At Risk (12/15/2023)  Tobacco Use: High Risk (12/13/2023)    Readmission Risk Interventions    12/14/2023    2:24 PM  Readmission Risk Prevention Plan  Transportation Screening Complete  Medication Review (RN Care Manager) Complete  PCP or Specialist appointment within 3-5 days of discharge Complete  HRI or Home Care Consult Complete  SW Recovery Care/Counseling Consult Complete  Palliative Care Screening Not Applicable  Skilled Nursing Facility Complete

## 2024-08-01 NOTE — Plan of Care (Signed)
  Problem: Coping: Goal: Ability to identify and develop effective coping behavior will improve Outcome: Progressing   Problem: Clinical Measurements: Goal: Quality of life will improve Outcome: Progressing   Problem: Respiratory: Goal: Verbalizations of increased ease of respirations will increase Outcome: Progressing   Problem: Role Relationship: Goal: Family's ability to cope with current situation will improve Outcome: Progressing Goal: Ability to verbalize concerns, feelings, and thoughts to partner or family member will improve Outcome: Progressing

## 2024-08-02 DIAGNOSIS — D649 Anemia, unspecified: Secondary | ICD-10-CM | POA: Diagnosis not present
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
## 2024-08-02 NOTE — Plan of Care (Signed)
°  Problem: Coping: Goal: Ability to identify and develop effective coping behavior will improve Outcome: Progressing   Problem: Clinical Measurements: Goal: Quality of life will improve Outcome: Progressing   Problem: Respiratory: Goal: Verbalizations of increased ease of respirations will increase Outcome: Progressing   Problem: Role Relationship: Goal: Family's ability to cope with current situation will improve Outcome: Progressing

## 2024-08-02 NOTE — Progress Notes (Signed)
 Pt refused all dressing changes and medications. MD aware

## 2024-08-02 NOTE — TOC Progression Note (Signed)
 Transition of Care [facility]) - Progression Note    Patient Details  Name: Luke Velasquez MRN: [id number] Date of Birth: 2001/07/17  Transition of Care [facility]) CM/SW Contact  Hadia Minier [name] Moose, [place] Phone Number: 08/02/2024, 8:38 AM  Clinical Narrative:    CSW received a voicemail from [employer] with [employer]. Jamee stated she had spoken with Tiffany w/ admissions at [facility] (now [facility]) and she is willing to look at pt referral. CSW emailed referral to Tiffany at [email or web address] and will continue to follow.   Expected Discharge Plan: Skilled Nursing Facility Barriers to Discharge: Continued Medical Work up, English As A Second Language Teacher, SNF Pending bed offer               Expected Discharge Plan and Services In-house Referral: Clinical Social Work   Post Acute Care Choice: Skilled Nursing Facility Living arrangements for the past 2 months: Single Family Home                                       Social Drivers of Health (SDOH) Interventions SDOH Screenings   Food Insecurity: No Food Insecurity (12/15/2023)  Recent Concern: Food Insecurity - Food Insecurity Present (12/01/2023)  Housing: Low Risk (12/15/2023)  Transportation Needs: No Transportation Needs (12/15/2023)  Utilities: Not At Risk (12/15/2023)  Tobacco Use: High Risk (12/13/2023)    Readmission Risk Interventions    12/14/2023    2:24 PM  Readmission Risk Prevention Plan  Transportation Screening Complete  Medication Review (RN Care Manager) Complete  PCP or Specialist appointment within 3-5 days of discharge Complete  HRI or Home Care Consult Complete  SW Recovery Care/Counseling Consult Complete  Palliative Care Screening Not Applicable  Skilled Nursing Facility Complete

## 2024-08-03 DIAGNOSIS — D649 Anemia, unspecified: Secondary | ICD-10-CM | POA: Diagnosis not present
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
## 2024-08-03 NOTE — Plan of Care (Signed)
  Problem: Coping: Goal: Ability to identify and develop effective coping behavior will improve Outcome: Progressing   Problem: Respiratory: Goal: Verbalizations of increased ease of respirations will increase Outcome: Progressing   Problem: Role Relationship: Goal: Ability to verbalize concerns, feelings, and thoughts to partner or family member will improve Outcome: Progressing

## 2024-08-04 DIAGNOSIS — D649 Anemia, unspecified: Secondary | ICD-10-CM | POA: Diagnosis not present

## 2024-08-04 NOTE — Plan of Care (Signed)
" °  Problem: Coping: Goal: Ability to identify and develop effective coping behavior will improve Outcome: Progressing   Problem: Clinical Measurements: Goal: Quality of life will improve Outcome: Progressing   Problem: Role Relationship: Goal: Family's ability to cope with current situation will improve Outcome: Progressing Goal: Ability to verbalize concerns, feelings, and thoughts to partner or family member will improve Outcome: Progressing   "
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
## 2024-08-05 DIAGNOSIS — D649 Anemia, unspecified: Secondary | ICD-10-CM | POA: Diagnosis not present
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
## 2024-08-05 NOTE — Plan of Care (Signed)
" °  Problem: Pain Managment: Goal: General experience of comfort will improve and/or be controlled Outcome: Progressing   Problem: Coping: Goal: Ability to identify and develop effective coping behavior will improve Outcome: Progressing   Problem: Clinical Measurements: Goal: Quality of life will improve Outcome: Progressing   Problem: Respiratory: Goal: Verbalizations of increased ease of respirations will increase Outcome: Progressing   Problem: Role Relationship: Goal: Family's ability to cope with current situation will improve Outcome: Progressing Goal: Ability to verbalize concerns, feelings, and thoughts to partner or family member will improve Outcome: Progressing   "

## 2024-08-05 NOTE — Plan of Care (Signed)
" °  Problem: Pain Managment: Goal: General experience of comfort will improve and/or be controlled 08/05/2024 0442 by Arna Rhymes, RN Outcome: Progressing 08/05/2024 0442 by Arna Rhymes, RN Reactivated   Problem: Respiratory: Goal: Verbalizations of increased ease of respirations will increase Outcome: Progressing   "

## 2024-08-06 DIAGNOSIS — D649 Anemia, unspecified: Secondary | ICD-10-CM | POA: Diagnosis not present
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
## 2024-08-07 DIAGNOSIS — D649 Anemia, unspecified: Secondary | ICD-10-CM | POA: Diagnosis not present
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
## 2024-08-07 NOTE — Plan of Care (Signed)
  Problem: Coping: Goal: Ability to identify and develop effective coping behavior will improve Outcome: Progressing   

## 2024-08-08 DIAGNOSIS — Z515 Encounter for palliative care: Secondary | ICD-10-CM | POA: Diagnosis not present

## 2024-08-08 DIAGNOSIS — D649 Anemia, unspecified: Secondary | ICD-10-CM | POA: Diagnosis not present

## 2024-08-08 NOTE — Progress Notes (Signed)
 Pt refused wound measurements being taken today.

## 2024-08-08 NOTE — Plan of Care (Signed)
  Problem: Coping: Goal: Ability to identify and develop effective coping behavior will improve Outcome: Progressing   
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
## 2024-08-08 NOTE — TOC Progression Note (Addendum)
 Transition of Care [facility]) - Progression Note    Patient Details  Name: Luke Velasquez MRN: [id number] Date of Birth: 2001-03-02  [facility] CM/SW Contact  Kyheem Bathgate [name] [place], [place] Phone Number: 08/08/2024, 3:38 PM  Clinical Narrative:    CSW reviewed insurance forms dropped off by pt mother with ICM supervisor. CSW was advised to complete appropriate sections of forms and return to pt mother. CSW completed appropriate sections of form and returned to pt room as well as called pt mother to inform her CSW has completed sections of the forms she was able to but did not receive a response- CSW left a voicemail and provided call back information. CSW also sent SNF referral to [facility] and will continue to follow.   Expected Discharge Plan: Skilled Nursing Facility Barriers to Discharge: Continued Medical Work up, English As A Second Language Teacher, SNF Pending bed offer               Expected Discharge Plan and Services In-house Referral: Clinical Social Work   Post Acute Care Choice: Skilled Nursing Facility Living arrangements for the past 2 months: Single Family Home                                       Social Drivers of Health (SDOH) Interventions SDOH Screenings   Food Insecurity: No Food Insecurity (12/15/2023)  Recent Concern: Food Insecurity - Food Insecurity Present (12/01/2023)  Housing: Low Risk (12/15/2023)  Transportation Needs: No Transportation Needs (12/15/2023)  Utilities: Not At Risk (12/15/2023)  Tobacco Use: High Risk (12/13/2023)    Readmission Risk Interventions    12/14/2023    2:24 PM  Readmission Risk Prevention Plan  Transportation Screening Complete  Medication Review (RN Care Manager) Complete  PCP or Specialist appointment within 3-5 days of discharge Complete  HRI or Home Care Consult Complete  SW Recovery Care/Counseling Consult Complete  Palliative Care Screening Not Applicable  Skilled Nursing Facility Complete

## 2024-08-09 DIAGNOSIS — Z515 Encounter for palliative care: Secondary | ICD-10-CM

## 2024-08-09 DIAGNOSIS — D649 Anemia, unspecified: Secondary | ICD-10-CM | POA: Diagnosis not present
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
## 2024-08-09 NOTE — Plan of Care (Signed)
" °  Problem: Pain Managment: Goal: General experience of comfort will improve and/or be controlled Outcome: Progressing   Problem: Coping: Goal: Ability to identify and develop effective coping behavior will improve Outcome: Progressing   Problem: Clinical Measurements: Goal: Quality of life will improve Outcome: Progressing   Problem: Respiratory: Goal: Verbalizations of increased ease of respirations will increase Outcome: Progressing   Problem: Role Relationship: Goal: Family's ability to cope with current situation will improve Outcome: Progressing Goal: Ability to verbalize concerns, feelings, and thoughts to partner or family member will improve Outcome: Progressing   "

## 2024-08-10 DIAGNOSIS — D649 Anemia, unspecified: Secondary | ICD-10-CM | POA: Diagnosis not present
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
## 2024-08-10 NOTE — Plan of Care (Signed)
" °  Problem: Pain Managment: Goal: General experience of comfort will improve and/or be controlled Outcome: Progressing   Problem: Coping: Goal: Ability to identify and develop effective coping behavior will improve Outcome: Progressing   Problem: Clinical Measurements: Goal: Quality of life will improve Outcome: Progressing   Problem: Respiratory: Goal: Verbalizations of increased ease of respirations will increase Outcome: Progressing   Problem: Role Relationship: Goal: Family's ability to cope with current situation will improve Outcome: Progressing Goal: Ability to verbalize concerns, feelings, and thoughts to partner or family member will improve Outcome: Progressing   "

## 2024-08-11 DIAGNOSIS — Z515 Encounter for palliative care: Secondary | ICD-10-CM

## 2024-08-11 NOTE — Plan of Care (Signed)
  Problem: Pain Managment: Goal: General experience of comfort will improve and/or be controlled Outcome: Progressing

## 2024-08-11 NOTE — Plan of Care (Signed)
  Problem: Coping: Goal: Ability to identify and develop effective coping behavior will improve Outcome: Progressing   

## 2024-08-11 NOTE — Progress Notes (Signed)
 Patient refuse shift assessment and all medications. Patient denies any pain. Patient eating food he ordered and state, I don't need anything.
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
## 2024-08-12 DIAGNOSIS — Z515 Encounter for palliative care: Secondary | ICD-10-CM | POA: Diagnosis not present

## 2024-08-12 NOTE — TOC Progression Note (Signed)
 Transition of Care [facility]) - Progression Note    Patient Details  Name: Luke Velasquez MRN: [id number] Date of Birth: Oct 01, 2000  Transition of Care [facility]) CM/SW Contact  Inocente GORMAN Kindle, LCSW Phone Number: 08/12/2024, 11:42 AM  Clinical Narrative:    Awaiting response from [facility]. Inpatient Care Management continuing to follow. Discharge barriers remain in place. Leadership aware.     Expected Discharge Plan: Skilled Nursing Facility Barriers to Discharge: Continued Medical Work up, English As A Second Language Teacher, SNF Pending bed offer               Expected Discharge Plan and Services In-house Referral: Clinical Social Work   Post Acute Care Choice: Skilled Nursing Facility Living arrangements for the past 2 months: Single Family Home                                       Social Drivers of Health (SDOH) Interventions SDOH Screenings   Food Insecurity: No Food Insecurity (12/15/2023)  Recent Concern: Food Insecurity - Food Insecurity Present (12/01/2023)  Housing: Low Risk (12/15/2023)  Transportation Needs: No Transportation Needs (12/15/2023)  Utilities: Not At Risk (12/15/2023)  Tobacco Use: High Risk (12/13/2023)    Readmission Risk Interventions    12/14/2023    2:24 PM  Readmission Risk Prevention Plan  Transportation Screening Complete  Medication Review (RN Care Manager) Complete  PCP or Specialist appointment within 3-5 days of discharge Complete  HRI or Home Care Consult Complete  SW Recovery Care/Counseling Consult Complete  Palliative Care Screening Not Applicable  Skilled Nursing Facility Complete
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
## 2024-08-13 DIAGNOSIS — Z515 Encounter for palliative care: Secondary | ICD-10-CM | POA: Diagnosis not present

## 2024-08-13 DIAGNOSIS — D649 Anemia, unspecified: Secondary | ICD-10-CM | POA: Diagnosis not present

## 2024-08-13 NOTE — TOC Progression Note (Signed)
 Transition of Care [facility]) - Progression Note    Patient Details  Name: Luke Velasquez MRN: [id number] Date of Birth: 07/04/2001  Transition of Care [facility]) CM/SW Contact  Inita Uram [name] Moose, [place] Phone Number: 08/13/2024, 2:10 PM  Clinical Narrative:    CSW spoke with pt mom, Loyd, over the phone. CSW updated Loyd and stated she had filled out the appropriate sections of the forms she dropped off regarding switching pt insurance to a tailored plan and placed them back on night stand in pt room. CSW also told Loyd a referral had been sent to [facility] and that facility had tried to contact her on 1/21 to complete financial review. Loyd stated she had not received a call, CSW provided [name] with [employer], Jessica's, phone numner [phone]). Loyd stated she would reach out to her. CSW contacted Tammy w/ [facility] and informed herr CSW had passed Jessica's contact information along to [employer]. Tammy stated they would attempt to get in touch with Loyd again today. CSW will continue to follow.  2:37PM- CSW received a message from Tammy w/ [facility]- Harlene was able to get in touch with Loyd and she is working on obtaining bank statements for facility. CSW will continue to follow.   Expected Discharge Plan: Skilled Nursing Facility Barriers to Discharge: Continued Medical Work up, English As A Second Language Teacher, SNF Pending bed offer               Expected Discharge Plan and Services In-house Referral: Clinical Social Work   Post Acute Care Choice: Skilled Nursing Facility Living arrangements for the past 2 months: Single Family Home                                       Social Drivers of Health (SDOH) Interventions SDOH Screenings   Food Insecurity: No Food Insecurity (12/15/2023)  Recent Concern: Food Insecurity - Food Insecurity Present (12/01/2023)  Housing: Low Risk (12/15/2023)  Transportation Needs: No Transportation Needs  (12/15/2023)  Utilities: Not At Risk (12/15/2023)  Tobacco Use: High Risk (12/13/2023)    Readmission Risk Interventions    12/14/2023    2:24 PM  Readmission Risk Prevention Plan  Transportation Screening Complete  Medication Review (RN Care Manager) Complete  PCP or Specialist appointment within 3-5 days of discharge Complete  HRI or Home Care Consult Complete  SW Recovery Care/Counseling Consult Complete  Palliative Care Screening Not Applicable  Skilled Nursing Facility Complete
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
## 2024-08-14 DIAGNOSIS — D649 Anemia, unspecified: Secondary | ICD-10-CM | POA: Diagnosis not present
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
## 2024-08-14 NOTE — Plan of Care (Signed)
" °  Problem: Pain Managment: Goal: General experience of comfort will improve and/or be controlled Outcome: Progressing   Problem: Respiratory: Goal: Verbalizations of increased ease of respirations will increase Outcome: Progressing   "

## 2024-08-14 NOTE — TOC Progression Note (Addendum)
 Transition of Care [facility]) - Progression Note    Patient Details  Name: Luke Velasquez MRN: [id number] Date of Birth: [birth year]  [facility]) CM/SW Contact  Oveta Idris [name] Moose, [place] Phone Number: 08/14/2024, 11:53 AM  Clinical Narrative:    CSW spoke with Jamee Breeding w/ [facility] Medicaid and provided update regarding referral sent to [facility]. CSW stated pt mom is working on obtaining bank statements requested by facility before they make a final decision about pt admission. Jamee stated if shara is submitted for admission, [facility] has 2 NPI numbers, one they are not in network with and the other they are in network with as of January 17, 2024 (NPI# [id number]). Jamee stated if shara is submitted under the NPI they are in network with there should not be any issues but if shara is submitted under the NPI they are not in network with they would have to submit a single case agreement. CSW contacted Tammy w/ [facility] and provided her with this information. CSW will continue to follow.  3:38 PM- CSW spoke with Tammy w/ [facility] over the phone. Tammy asked if an updated PT note could be uploaded so we know what pt current baseline is. PT last worked with pt 02/01/24 and signed off. CSW messaged ptMD to see if new PT order could be placed. CSW will continue to follow.   Expected Discharge Plan: Skilled Nursing Facility Barriers to Discharge: Continued Medical Work up, English As A Second Language Teacher, SNF Pending bed offer               Expected Discharge Plan and Services In-house Referral: Clinical Social Work   Post Acute Care Choice: Skilled Nursing Facility Living arrangements for the past 2 months: Single Family Home                                       Social Drivers of Health (SDOH) Interventions SDOH Screenings   Food Insecurity: No Food Insecurity (12/15/2023)  Recent Concern: Food Insecurity - Food Insecurity Present (12/01/2023)   Housing: Low Risk (12/15/2023)  Transportation Needs: No Transportation Needs (12/15/2023)  Utilities: Not At Risk (12/15/2023)  Tobacco Use: High Risk (12/13/2023)    Readmission Risk Interventions    12/14/2023    2:24 PM  Readmission Risk Prevention Plan  Transportation Screening Complete  Medication Review (RN Care Manager) Complete  PCP or Specialist appointment within 3-5 days of discharge Complete  HRI or Home Care Consult Complete  SW Recovery Care/Counseling Consult Complete  Palliative Care Screening Not Applicable  Skilled Nursing Facility Complete

## 2024-08-15 DIAGNOSIS — D649 Anemia, unspecified: Secondary | ICD-10-CM | POA: Diagnosis not present
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
## 2024-08-15 NOTE — Progress Notes (Signed)
 Attempted four times today for a bath and dressing changes with photos. Pt refused all times.Educated pt on importance of ADLs and wound therapy.

## 2024-08-15 NOTE — Plan of Care (Signed)
  Problem: Coping: Goal: Ability to identify and develop effective coping behavior will improve Outcome: Progressing   

## 2024-08-16 DIAGNOSIS — D649 Anemia, unspecified: Secondary | ICD-10-CM | POA: Diagnosis not present
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
## 2024-08-16 NOTE — Plan of Care (Signed)
  Problem: Coping: Goal: Ability to identify and develop effective coping behavior will improve Outcome: Progressing   

## 2024-08-16 NOTE — Plan of Care (Signed)
" °  Problem: Respiratory: Goal: Verbalizations of increased ease of respirations will increase Outcome: Progressing   Problem: Pain Managment: Goal: General experience of comfort will improve and/or be controlled Outcome: Not Progressing   Problem: Coping: Goal: Ability to identify and develop effective coping behavior will improve Outcome: Not Progressing   Problem: Clinical Measurements: Goal: Quality of life will improve Outcome: Not Progressing   Problem: Role Relationship: Goal: Family's ability to cope with current situation will improve Outcome: Not Progressing Goal: Ability to verbalize concerns, feelings, and thoughts to partner or family member will improve Outcome: Not Progressing   "

## 2024-08-17 DIAGNOSIS — D649 Anemia, unspecified: Secondary | ICD-10-CM | POA: Diagnosis not present
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
## 2024-08-17 NOTE — Progress Notes (Signed)
 PtPT Cancellation Note  Patient Details Name: Luke Velasquez MRN: [id number] DOB: January 18, 2001   Cancelled Treatment:    Reason Eval/Treat Not Completed: Patient declined, no reason specified. Pt declined PT evaluation and mobility attempts. Pt reports he has been transferring into his wheelchair and rolling into the hallways at times. When asked if PT should attempt again later today the pt again declines. PT will follow up early next week if evaluation remains necessary and if the pt is agreeable to participate.   Bernardino JINNY Ruth 08/17/2024, 11:44 AM

## 2024-08-17 NOTE — Progress Notes (Signed)
 Patient refused morning medications and assessment. Patient is agreeable to having dressings changed before lunch, will re-attempt assessment at that time.

## 2024-08-17 NOTE — TOC Progression Note (Signed)
 Transition of Care [facility]) - Progression Note    Patient Details  Name: Luke Velasquez MRN: [id number] Date of Birth: Aug 29, 2000  [facility] CM/SW Contact  Luise JAYSON Pan, [place] Phone Number: 08/17/2024, 8:32 PM  Clinical Narrative:   Awaiting patient to participate with PT so CSW can follow up with [facility]. Patient declined today.  CSW will continue to follow.    Expected Discharge Plan: Skilled Nursing Facility Barriers to Discharge: Continued Medical Work up, English As A Second Language Teacher, SNF Pending bed offer               Expected Discharge Plan and Services In-house Referral: Clinical Social Work   Post Acute Care Choice: Skilled Nursing Facility Living arrangements for the past 2 months: Single Family Home                                       Social Drivers of Health (SDOH) Interventions SDOH Screenings   Food Insecurity: No Food Insecurity (12/15/2023)  Recent Concern: Food Insecurity - Food Insecurity Present (12/01/2023)  Housing: Low Risk (12/15/2023)  Transportation Needs: No Transportation Needs (12/15/2023)  Utilities: Not At Risk (12/15/2023)  Tobacco Use: High Risk (12/13/2023)    Readmission Risk Interventions    12/14/2023    2:24 PM  Readmission Risk Prevention Plan  Transportation Screening Complete  Medication Review (RN Care Manager) Complete  PCP or Specialist appointment within 3-5 days of discharge Complete  HRI or Home Care Consult Complete  SW Recovery Care/Counseling Consult Complete  Palliative Care Screening Not Applicable  Skilled Nursing Facility Complete

## 2024-08-18 DIAGNOSIS — D649 Anemia, unspecified: Secondary | ICD-10-CM | POA: Diagnosis not present

## 2024-08-18 NOTE — Plan of Care (Signed)
" °  Problem: Pain Managment: Goal: General experience of comfort will improve and/or be controlled Outcome: Progressing   Problem: Respiratory: Goal: Verbalizations of increased ease of respirations will increase Outcome: Progressing   "
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
## 2024-08-19 DIAGNOSIS — D649 Anemia, unspecified: Secondary | ICD-10-CM | POA: Diagnosis not present

## 2024-08-19 NOTE — Plan of Care (Signed)
" °  Problem: Pain Managment: Goal: General experience of comfort will improve and/or be controlled Outcome: Progressing   Problem: Clinical Measurements: Goal: Quality of life will improve Outcome: Progressing   "
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
## 2024-08-19 NOTE — Plan of Care (Signed)
" °  Problem: Pain Managment: Goal: General experience of comfort will improve and/or be controlled Outcome: Progressing   Problem: Coping: Goal: Ability to identify and develop effective coping behavior will improve Outcome: Progressing   Problem: Clinical Measurements: Goal: Quality of life will improve Outcome: Progressing   Problem: Respiratory: Goal: Verbalizations of increased ease of respirations will increase Outcome: Progressing   Problem: Role Relationship: Goal: Family's ability to cope with current situation will improve Outcome: Progressing Goal: Ability to verbalize concerns, feelings, and thoughts to partner or family member will improve Outcome: Progressing   "

## 2024-08-20 DIAGNOSIS — D649 Anemia, unspecified: Secondary | ICD-10-CM | POA: Diagnosis not present

## 2024-08-21 DIAGNOSIS — D649 Anemia, unspecified: Secondary | ICD-10-CM | POA: Diagnosis not present
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
## 2024-08-23 NOTE — TOC Progression Note (Addendum)
 Transition of Care [facility]) - Progression Note    Patient Details  Name: Luke Velasquez MRN: [id number] Date of Birth: January 20, 2001  [facility] CM/SW Contact  Shamyah Stantz [name] Mo[place], [place] Phone Number: 08/23/2024, 4:19 PM  Clinical Narrative:    CSW contacted pt mom to see if she was able to provide [facility] with bank statements they need in order to complete financial review before they decide whether they can admit pt but did not receive a response. CSW left a voicemail and provided call back information. CSW will continue to follow.  4:24 PM- CSW received a message from pt mom, [name]. She stated she has not been able to provide facility with bank statements yet but that she has a tour scheduled with the facility on Monday. CSW will continue to follow.  Expected Discharge Plan: Skilled Nursing Facility Barriers to Discharge: Continued Medical Work up, English As A Second Language Teacher, SNF Pending bed offer               Expected Discharge Plan and Services In-house Referral: Clinical Social Work   Post Acute Care Choice: Skilled Nursing Facility Living arrangements for the past 2 months: Single Family Home                                       Social Drivers of Health (SDOH) Interventions SDOH Screenings   Food Insecurity: No Food Insecurity (12/15/2023)  Recent Concern: Food Insecurity - Food Insecurity Present (12/01/2023)  Housing: Low Risk (12/15/2023)  Transportation Needs: No Transportation Needs (12/15/2023)  Utilities: Not At Risk (12/15/2023)  Tobacco Use: High Risk (12/13/2023)    Readmission Risk Interventions    12/14/2023    2:24 PM  Readmission Risk Prevention Plan  Transportation Screening Complete  Medication Review (RN Care Manager) Complete  PCP or Specialist appointment within 3-5 days of discharge Complete  HRI or Home Care Consult Complete  SW Recovery Care/Counseling Consult Complete  Palliative Care Screening Not Applicable  Skilled  Nursing Facility Complete
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
## 2024-08-24 NOTE — Plan of Care (Signed)
" °  Problem: Pain Managment: Goal: General experience of comfort will improve and/or be controlled Outcome: Progressing   Problem: Coping: Goal: Ability to identify and develop effective coping behavior will improve Outcome: Progressing   Problem: Clinical Measurements: Goal: Quality of life will improve Outcome: Progressing   Problem: Respiratory: Goal: Verbalizations of increased ease of respirations will increase Outcome: Progressing   Problem: Role Relationship: Goal: Family's ability to cope with current situation will improve Outcome: Progressing Goal: Ability to verbalize concerns, feelings, and thoughts to partner or family member will improve Outcome: Progressing   "
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
